# Patient Record
Sex: Female | Born: 1975 | Race: White | Hispanic: No | Marital: Married | Smoking: Never smoker
Health system: Southern US, Community
[De-identification: ages and names within clinical notes are randomized; demographics above are authoritative.]

## PROBLEM LIST (undated history)

## (undated) DIAGNOSIS — I5189 Other ill-defined heart diseases: Secondary | ICD-10-CM

## (undated) DIAGNOSIS — N8003 Adenomyosis of the uterus: Secondary | ICD-10-CM

## (undated) DIAGNOSIS — N809 Endometriosis, unspecified: Secondary | ICD-10-CM

## (undated) DIAGNOSIS — U071 COVID-19: Secondary | ICD-10-CM

## (undated) DIAGNOSIS — E119 Type 2 diabetes mellitus without complications: Secondary | ICD-10-CM

## (undated) DIAGNOSIS — I2510 Atherosclerotic heart disease of native coronary artery without angina pectoris: Secondary | ICD-10-CM

## (undated) DIAGNOSIS — K648 Other hemorrhoids: Secondary | ICD-10-CM

## (undated) DIAGNOSIS — N800 Endometriosis of uterus: Secondary | ICD-10-CM

## (undated) DIAGNOSIS — K589 Irritable bowel syndrome without diarrhea: Secondary | ICD-10-CM

## (undated) DIAGNOSIS — N200 Calculus of kidney: Secondary | ICD-10-CM

## (undated) DIAGNOSIS — H8109 Meniere's disease, unspecified ear: Secondary | ICD-10-CM

## (undated) DIAGNOSIS — O24419 Gestational diabetes mellitus in pregnancy, unspecified control: Secondary | ICD-10-CM

## (undated) DIAGNOSIS — K449 Diaphragmatic hernia without obstruction or gangrene: Secondary | ICD-10-CM

## (undated) DIAGNOSIS — E785 Hyperlipidemia, unspecified: Secondary | ICD-10-CM

## (undated) DIAGNOSIS — K8020 Calculus of gallbladder without cholecystitis without obstruction: Secondary | ICD-10-CM

## (undated) DIAGNOSIS — R42 Dizziness and giddiness: Secondary | ICD-10-CM

## (undated) HISTORY — DX: Irritable bowel syndrome, unspecified: K58.9

## (undated) HISTORY — DX: Other ill-defined heart diseases: I51.89

## (undated) HISTORY — DX: Diaphragmatic hernia without obstruction or gangrene: K44.9

## (undated) HISTORY — DX: Atherosclerotic heart disease of native coronary artery without angina pectoris: I25.10

## (undated) HISTORY — DX: Calculus of gallbladder without cholecystitis without obstruction: K80.20

## (undated) HISTORY — DX: Dizziness and giddiness: R42

## (undated) HISTORY — DX: Endometriosis, unspecified: N80.9

## (undated) HISTORY — DX: Type 2 diabetes mellitus without complications: E11.9

## (undated) HISTORY — DX: Hyperlipidemia, unspecified: E78.5

## (undated) HISTORY — PX: WISDOM TOOTH EXTRACTION: SHX21

## (undated) HISTORY — DX: Calculus of kidney: N20.0

## (undated) HISTORY — DX: Adenomyosis of the uterus: N80.03

## (undated) HISTORY — DX: Meniere's disease, unspecified ear: H81.09

## (undated) HISTORY — DX: Gestational diabetes mellitus in pregnancy, unspecified control: O24.419

## (undated) HISTORY — DX: COVID-19: U07.1

## (undated) HISTORY — DX: Other hemorrhoids: K64.8

## (undated) HISTORY — DX: Endometriosis of uterus: N80.0

---

## 1995-08-02 HISTORY — PX: CYSTECTOMY: SUR359

## 1998-05-08 ENCOUNTER — Ambulatory Visit (HOSPITAL_COMMUNITY): Admission: RE | Admit: 1998-05-08 | Discharge: 1998-05-08 | Payer: Self-pay | Admitting: Obstetrics & Gynecology

## 1999-01-14 ENCOUNTER — Inpatient Hospital Stay (HOSPITAL_COMMUNITY): Admission: AD | Admit: 1999-01-14 | Discharge: 1999-01-14 | Payer: Self-pay | Admitting: Obstetrics and Gynecology

## 1999-06-28 ENCOUNTER — Inpatient Hospital Stay (HOSPITAL_COMMUNITY): Admission: AD | Admit: 1999-06-28 | Discharge: 1999-06-28 | Payer: Self-pay | Admitting: *Deleted

## 1999-06-28 ENCOUNTER — Inpatient Hospital Stay (HOSPITAL_COMMUNITY): Admission: AD | Admit: 1999-06-28 | Discharge: 1999-06-28 | Payer: Self-pay | Admitting: Obstetrics and Gynecology

## 1999-07-23 ENCOUNTER — Encounter: Payer: Self-pay | Admitting: Obstetrics and Gynecology

## 1999-07-23 ENCOUNTER — Inpatient Hospital Stay (HOSPITAL_COMMUNITY): Admission: AD | Admit: 1999-07-23 | Discharge: 1999-07-23 | Payer: Self-pay | Admitting: Obstetrics and Gynecology

## 1999-07-26 ENCOUNTER — Inpatient Hospital Stay (HOSPITAL_COMMUNITY): Admission: AD | Admit: 1999-07-26 | Discharge: 1999-07-26 | Payer: Self-pay | Admitting: Obstetrics and Gynecology

## 1999-08-06 ENCOUNTER — Inpatient Hospital Stay (HOSPITAL_COMMUNITY): Admission: AD | Admit: 1999-08-06 | Discharge: 1999-08-06 | Payer: Self-pay | Admitting: *Deleted

## 1999-08-11 ENCOUNTER — Encounter (INDEPENDENT_AMBULATORY_CARE_PROVIDER_SITE_OTHER): Payer: Self-pay | Admitting: Specialist

## 1999-08-11 ENCOUNTER — Inpatient Hospital Stay (HOSPITAL_COMMUNITY): Admission: AD | Admit: 1999-08-11 | Discharge: 1999-08-17 | Payer: Self-pay | Admitting: Obstetrics and Gynecology

## 1999-08-14 ENCOUNTER — Encounter: Payer: Self-pay | Admitting: Obstetrics and Gynecology

## 1999-09-15 ENCOUNTER — Other Ambulatory Visit: Admission: RE | Admit: 1999-09-15 | Discharge: 1999-09-15 | Payer: Self-pay | Admitting: Obstetrics and Gynecology

## 2000-10-20 ENCOUNTER — Other Ambulatory Visit: Admission: RE | Admit: 2000-10-20 | Discharge: 2000-10-20 | Payer: Self-pay | Admitting: Obstetrics and Gynecology

## 2001-11-19 ENCOUNTER — Other Ambulatory Visit: Admission: RE | Admit: 2001-11-19 | Discharge: 2001-11-19 | Payer: Self-pay | Admitting: Obstetrics and Gynecology

## 2002-10-30 ENCOUNTER — Other Ambulatory Visit: Admission: RE | Admit: 2002-10-30 | Discharge: 2002-10-30 | Payer: Self-pay | Admitting: Obstetrics and Gynecology

## 2002-12-13 ENCOUNTER — Ambulatory Visit: Admission: RE | Admit: 2002-12-13 | Discharge: 2002-12-13 | Payer: Self-pay | Admitting: Obstetrics and Gynecology

## 2003-01-20 ENCOUNTER — Encounter: Payer: Self-pay | Admitting: Obstetrics and Gynecology

## 2003-01-20 ENCOUNTER — Inpatient Hospital Stay (HOSPITAL_COMMUNITY): Admission: AD | Admit: 2003-01-20 | Discharge: 2003-01-20 | Payer: Self-pay | Admitting: Obstetrics and Gynecology

## 2003-02-27 ENCOUNTER — Ambulatory Visit (HOSPITAL_COMMUNITY): Admission: RE | Admit: 2003-02-27 | Discharge: 2003-02-27 | Payer: Self-pay | Admitting: Obstetrics and Gynecology

## 2003-04-01 ENCOUNTER — Encounter: Admission: RE | Admit: 2003-04-01 | Discharge: 2003-04-01 | Payer: Self-pay | Admitting: Obstetrics and Gynecology

## 2003-04-21 ENCOUNTER — Inpatient Hospital Stay (HOSPITAL_COMMUNITY): Admission: AD | Admit: 2003-04-21 | Discharge: 2003-04-22 | Payer: Self-pay | Admitting: Obstetrics and Gynecology

## 2003-05-15 ENCOUNTER — Inpatient Hospital Stay (HOSPITAL_COMMUNITY): Admission: AD | Admit: 2003-05-15 | Discharge: 2003-05-19 | Payer: Self-pay | Admitting: Obstetrics and Gynecology

## 2003-06-17 ENCOUNTER — Other Ambulatory Visit: Admission: RE | Admit: 2003-06-17 | Discharge: 2003-06-17 | Payer: Self-pay | Admitting: Obstetrics and Gynecology

## 2004-11-22 ENCOUNTER — Other Ambulatory Visit: Admission: RE | Admit: 2004-11-22 | Discharge: 2004-11-22 | Payer: Self-pay | Admitting: Obstetrics and Gynecology

## 2007-01-20 ENCOUNTER — Emergency Department (HOSPITAL_COMMUNITY): Admission: EM | Admit: 2007-01-20 | Discharge: 2007-01-20 | Payer: Self-pay | Admitting: Emergency Medicine

## 2009-08-01 HISTORY — PX: CHOLECYSTECTOMY: SHX55

## 2010-01-29 HISTORY — PX: ENDOMETRIAL ABLATION: SHX621

## 2010-05-11 ENCOUNTER — Ambulatory Visit (HOSPITAL_COMMUNITY)
Admission: RE | Admit: 2010-05-11 | Discharge: 2010-05-11 | Payer: Self-pay | Source: Home / Self Care | Admitting: Obstetrics and Gynecology

## 2010-10-14 LAB — CBC
HCT: 42.2 % (ref 36.0–46.0)
Hemoglobin: 14.7 g/dL (ref 12.0–15.0)
MCH: 31.1 pg (ref 26.0–34.0)
MCHC: 34.9 g/dL (ref 30.0–36.0)
MCV: 89.2 fL (ref 78.0–100.0)
Platelets: 254 10*3/uL (ref 150–400)
RBC: 4.73 MIL/uL (ref 3.87–5.11)
RDW: 12.6 % (ref 11.5–15.5)
WBC: 7.3 10*3/uL (ref 4.0–10.5)

## 2010-10-14 LAB — PREGNANCY, URINE: Preg Test, Ur: NEGATIVE

## 2010-12-17 NOTE — Discharge Summary (Signed)
[facility]  PatientPOOJA Jones                           MRN: [id number] Adm. Date:  [id number] Disc. Date: [id number] Attending:  Frederich Balding Dictator:   Danie Chandler, R.N.                           Discharge Summary  ADMISSION DIAGNOSIS:          Intrauterine pregnancy at term with evidence of                               macrosomia.  DISCHARGE DIAGNOSES:          1. Intrauterine pregnancy at term with evidence of                                  macrosomia.                               2. Postpartum endometritis.  PROCEDURE:                    On August 11, 1999, primary low transverse cesarean                               section.  REASON FOR ADMISSION:         The patient is a 35 year old gravida 4, para 1, married white female, with an estimated date of confinement of August 21, 1999, placing her at approximately 38-1/2 to 39-weeks gestation.  The patient has been admitted to undergo a primary cesarean section for management of a macrosomic infant.  HOSPITAL COURSE:              The patient is taken to the operating room and undergoes the above-named procedure without complication.  This is productive of a viable female infant with Apgars of 8 at one minute and 9 at five minutes. Postoperatively, on day #1, the patient had good control of pain and good return of bowel function.  Her hemoglobin was 8.4, hematocrit 26.3 and white blood cell count 10.9.  On postoperative day #2, the patient was tolerating a regular diet and ambulating well without difficulty.  Her hemoglobin was stable at 8.5 and she was on iron daily.  Later that day, patients temperature rose to approximately 103.2. Her lungs were clear, abdomen nontender, but her fundus was slightly tender. Diagnosis of probable endometritis was made and she was started on Unasyn.  She had CBC with differential, blood cultures, UA and C&S ordered.  On  postoperative day #3, the patient was feeling achy, temperature had fluctuated from 103.2 to 98.2 to 98.5 and then 103.4.  Her pulse was 116, respirations 20, blood pressure 110/58. Her lung sounds did reveal bilateral crackles but cleared with deep inspiration. Her abdomen remained soft, incision was healing well but uterine tenderness remained.  Patient had chest x-ray was ordered, which was normal.  She was continued on IV fluids and Unasyn and started on incentive spirometry.  Later that day, the patients temperature rose again to 101.8 and on postoperative day #4, er  hemoglobin was 9.3, white blood cell count 9200, temperature was 102.7, patient had gentamicin added to her antibiotics, and on the next day, she continued to feel  poorly.  Blood cultures did reveal gram-negative rods and by postoperative day 5, the patient was feeling better, she did have a bowel movement and had decreased  incisional tenderness and fundal tenderness.  The patient had been afebrile for 24 hours and she was feeling quite better.  The patient was discharged home later his day.  CONDITION ON DISCHARGE:       Good.  DIET:                         Regular as tolerated.  ACTIVITY:                     No heavy lifting, no driving, no vaginal entry.  DISCHARGE INSTRUCTIONS:       She is to follow up in the office in one week and she is to call for temperature greater than 100.5 degrees Fahrenheit, any redness or drainage from the incision site, nausea or vomiting or heavy bleeding.  DISCHARGE MEDICATIONS:        1. Prenatal vitamin one p.o. q.d.                               2. Tylox, #30, one to two p.o. q.4h. p.r.n. pain.  3. Ceftin 250 mg one twice a day for seven days.                               4. Prenatal vitamin one p.o. q.d. DD:  09/02/99 TD:  09/03/99 Job: [id number]

## 2010-12-17 NOTE — Op Note (Signed)
NAMEWENDEE, Candice Jones                             ACCOUNT NO.:  [id number]   MEDICAL RECORD NO.:  [id number]                   PATIENT TYPE:  INP   LOCATION:  9102                                 FACILITY:  [facility]   PHYSICIAN:  Juluis Mire, M.D.                DATE OF BIRTH:  08/15/1975   DATE OF PROCEDURE:  05/15/2003  DATE OF DISCHARGE:                                 OPERATIVE REPORT   PREOPERATIVE DIAGNOSES:  1. Intrauterine pregnancy at 39 weeks.  2. Prior cesarean section.  3. Desires repeat.   POSTOPERATIVE DIAGNOSES:  1. Intrauterine pregnancy at 39 weeks.  2. Prior cesarean section.  3. Desires repeat.   PROCEDURE:  Low-transverse cesarean section.   SURGEON:  Juluis Mire, M.D.   ANESTHESIA:  Spinal.   ESTIMATED BLOOD LOSS:  800 mL.   PACKS AND DRAINS:  None.   BLOOD REPLACED:  None.   COMPLICATIONS:  None.   INDICATIONS:  Dictated in the history and physical.   DESCRIPTION OF PROCEDURE:  The patient was taken to the OR and placed in the  supine position with left lateral tilt.  After a satisfactory level of  spinal anesthesia was obtained, the abdomen was prepped out with Betadine  and draped as a sterile field.   A prior low-transverse skin incision was made with a knife and carried  through the subcutaneous tissue.  The anterior rectus fascia was entered  sharply and the incision in the fascia extended laterally.  The fascia was  taken off the muscle superiorly and inferiorly.  Rectus muscles were  separated in the midline, and the peritoneum was entered sharply and the  incision in the peritoneum was extended both superiorly and inferiorly.  A  low-transverse bladder flap was developed, a low-transverse uterine incision  was begun with a knife and extended laterally using manual traction.  Amniotic fluid was clear.   The infant presented in the vertex presentation.  It was delivered with  elevation of the head and fundal pressure.  The infant was a  viable female who  weighed 10 pounds; APGARS 9/9.  The umbilical artery pH was 7.34.  The  placenta was then delivered manually.  The uterus was wiped free of the  remaining membranes and placenta.  The uterus was exteriorized for closure.  Tubes and ovaries were unremarkable.  The uterus was closed in a running  interlocking suture of #0 chromic using a two layer closure technique.  We  had good hemostasis.  The uterus was returned to the abdominal cavity.  Urine output was clear and adequate.   The muscles were reapproximated with a running suture of 3-0 Vicryl, fascia  closed with a running suture of #0 PDS, skin was closed with staples and  Steri-Strips.  Sponge, instrument, and needle count was correct by the  circulating nurse x2.  Urine output remained clear  at time of closure.   The patient tolerated the procedure well and was returned to the recovery  room in good condition.                                               Juluis Mire, M.D.    JSM/MEDQ  D:  05/15/2003  T:  05/15/2003  Job:  [id number]

## 2010-12-17 NOTE — H&P (Signed)
Candice Jones, Candice Jones                             ACCOUNT NO.:  [id number]   MEDICAL RECORD NO.:  [id number]                   PATIENT TYPE:  INP   LOCATION:  9102                                 FACILITY:  [facility]   PHYSICIAN:  Juluis Mire, M.D.                DATE OF BIRTH:  [birth year]   DATE OF ADMISSION:  05/15/2003  DATE OF DISCHARGE:                                HISTORY & PHYSICAL   HISTORY OF PRESENT ILLNESS:  The patient is a 35 year old gravida 5, para 2-  0-2-2 married white female with last menstrual period of August 17, 2002,  for an estimated date of confinement of May 26, 2003.  This gives her an  estimated gestational age of approximately 38-1/2 weeks.  This is consistent  with initial examination and ultrasound.  The patient presents for repeat  cesarean section.   The patient had a prior low transverse cesarean section with the last  pregnancy due to macrosomia.  The infant did weigh 10 pounds and 14 ounces.  Her prior delivery before that was a vaginal delivery.  She has opted to  proceed with a repeat cesarean section as opposed to a trial of labor. She  does have a history of prior pregnancy losses and a history of preterm labor  with prior pregnancies.  This pregnancy has been relatively uncomplicated  without any difficulties.  She presents now for repeat cesarean section.   ALLERGIES:  1. ERYTHROMYCIN.  2. SULFA.  3. AUGMENTIN.   MEDICATION:  Prenatal vitamins.   For past medical history, family history, and social history, please see  prenatal records.   REVIEW OF SYMPTOMS:  Noncontributory.   PHYSICAL EXAMINATION:  VITAL SIGNS:  The patient is afebrile with stable  vital signs.  HEENT:  Normocephalic.  Pupils are equal, round, reactive to light and  accommodation.  Extraocular movements were intact.  Sclerae and conjunctivae  clear.  Oropharynx clear.  NECK:  No thyromegaly.  BREASTS:  No discrete masses but glandular.  LUNGS:  Clear.  CARDIOVASCULAR:  Regular rate and rhythm with grade 2/6 systolic ejection  murmur.  No clicks or gallops.  ABDOMEN:  Gravid uterus consistent with dates.  PELVIC:  Examination deferred.  EXTREMITIES:  Trace edema.  NEUROLOGIC:  Deep tendon reflexes 2+, no clonus.   IMPRESSION:  Intrauterine pregnancy at term with prior cesarean section.  Desires repeat.    PLAN:  The patient will undergo repeat cesarean section.  Risks were  discussed including the risk of infection, the risk of hemorrhage that can  necessitate transfusion.  Risk of injury to adjacent organs including  bladder, bowel, ureters, that could require further exploratory surgery.  Risk of deep venous thrombosis and pulmonary embolus.  The patient expressed  understanding of indications and risks.  Juluis Mire, M.D.    JSM/MEDQ  D:  05/15/2003  T:  05/15/2003  Job:  [id number]

## 2010-12-17 NOTE — Discharge Summary (Signed)
NAMESHARLOTTE, Candice Jones                             ACCOUNT NO.:  [id number]   MEDICAL RECORD NO.:  [id number]                   PATIENT TYPE:  INP   LOCATION:  9102                                 FACILITY:  [facility]   PHYSICIAN:  Dineen Kid. Rana Snare, M.D.                 DATE OF BIRTH:  [birth year]   DATE OF ADMISSION:  05/15/2003  DATE OF DISCHARGE:  05/19/2003                                 DISCHARGE SUMMARY   ADMITTING DIAGNOSES:  1. Intrauterine pregnancy at term.  2. Previous cesarean delivery, desires repeat.  3. Macrosomia.   DISCHARGE DIAGNOSES:  1. Status post low transverse cesarean section.  2. Viable female infant.   PROCEDURE:  Repeat low transverse cesarean section.   REASON FOR ADMISSION:  Please see dictated H&P.   HOSPITAL COURSE:  The patient was a 35 year old gravida 5 para 2 white  married female that was admitted to [facility] for a  scheduled cesarean delivery.  The patient had had a previous cesarean  section due to macrosomia.  The patient had opted to proceed with a repeat  cesarean as opposed to a trial of labor.  On the morning of admission the  patient was taken to the operating room where spinal anesthesia was  administered without difficulty.  A low transverse incision was made with  the delivery of a viable female infant weighing 10 pounds with Apgars of 9 at  one minute and 9 at five minutes.  Umbilical cord pH was 7.34.  The patient  tolerated the procedure well and was taken to the recovery room in stable.  On postoperative day #1 vital signs were stable.  Abdomen was soft with good  return of bowel function.  Abdominal dressing was noted to be clean, dry,  and intact.  Labs revealed hemoglobin of 11.7; platelet count of 203,000;  wbc count of 11.1.  On postoperative day #2 vital signs were stable; the  patient remained afebrile.  Abdomen was soft.  Fundus was firm and  nontender.  Abdominal dressing was removed revealing an incision that  was  clean, dry, and intact.  On postoperative day #3 vital signs were stable;  the patient remained afebrile.  Abdomen was soft.  Fundus was firm and  nontender.  Incision was clean, dry, and intact.  She was tolerating a  regular diet without complaints of nausea and vomiting.  She was ambulating  well.  On postoperative day #4 the patient was doing well.  Vital signs were  stable; she remained afebrile.  Abdomen was soft.  Fundus was firm and  nontender.  Incision was clean, dry, and intact.  Staples were removed and  the patient was discharged home.   CONDITION ON DISCHARGE:  Good.   DIET:  Regular as tolerated.   ACTIVITY:  No heavy lifting, no driving x2 weeks, no vaginal entry.   FOLLOW-UP:  The patient is to follow up in the office in one week for an  incision check.  She is to call for temperature greater than 100 degrees,  persistent nausea and vomiting, heavy vaginal bleeding, and/or redness or  drainage from the incisional site.    DISCHARGE MEDICATIONS:  1. Percocet 5/325 #30 one p.o. q.4-6h. p.r.n. pain.  2. Motrin 600 mg q.6h. p.r.n.  3. Prenatal vitamins one p.o. daily.  4. Colace one p.o. daily p.r.n.     Julio Sicks, N.P.                        Dineen Kid Rana Snare, M.D.    CC/MEDQ  D:  06/06/2003  T:  06/06/2003  Job:  [id number]

## 2010-12-17 NOTE — Op Note (Signed)
[facility]  PatientANTONIETTA Jones                           MRN: [id number] Proc. Date: 08/11/99 Adm. Date:  [id number] Attending:  Frederich Balding                           Operative Report  PREOPERATIVE DIAGNOSIS:       Uterine pregnancy at term with evidence of                               macrosomia.  POSTOPERATIVE DIAGNOSIS:      Uterine pregnancy at term with evidence of                               macrosomia.  OPERATION:                    Low transverse cesarean section.  SURGEON:                      Juluis Mire, M.D.  ANESTHESIA:                   Spinal.  ESTIMATED BLOOD LOSS:         800 cc  PACKS/DRAINS:                 None.  INTRAOPERATIVE BLOOD REPLACEMENT:                  None.  COMPLICATIONS:                None.  INDICATION FOR PROCEDURE:     Dictated in the history and physical.  DESCRIPTION OF PROCEDURE:     The patient was taken to the OR and placed in the  supine position with left lateral tilt.  After satisfactory level of spinal anesthesia was obtained, the abdomen was prepped with Betadine and draped in a sterile field.  Low transverse skin incision was made with the knife and carried through the subcutaneous tissue.  The anterior rectus fascia was entered sharply. Incision fashioned laterally.  The fascia was taken off the muscle superiorly and inferiorly using blunt and sharp dissection. The rectus muscles were separated n the midline.  The perineum was entered sharply.  The incision in the perineum was extended both superiorly and inferiorly.  There was no evidence of any injury to attached organs.  A bladder flap was developed.  A low transverse uterine incision was begun with a knife and extended laterally using manual traction. The infant  presented in the vertex presentation and was delivered with elevation and fundal pressure.  Copious amniotic fluid was noted.  The infant was a  viable female who weighted 10 pounds 14 ounces. Apgar was 9.  Umbilical cord pH was 7.28. Placenta was delivered manually and sent for pathological review.  The uterus was closed  with interlocking sutures of 0 chromic using two layer closure technique with good hemostasis.  The tubes and ovaries were visualized and noted to be unremarkable. Urine output remained clear and adequate.  Muscles reapproximated with a running suture of 2-0 Vicryl.  The fascia was closed with running suture PDS.  The skin was closed with  staples and Steri-Strips.  Sponge, instrument and needle count were  verified as correct by the circulating nurse times two.  Foley catheter remained clear at the time of closure.  The patient tolerated the procedure well and was  returned to the recovery room in good condition. DD:  08/12/99 TD:  08/12/99 Job: [id number]

## 2011-05-09 ENCOUNTER — Inpatient Hospital Stay (INDEPENDENT_AMBULATORY_CARE_PROVIDER_SITE_OTHER)
Admission: RE | Admit: 2011-05-09 | Discharge: 2011-05-09 | Disposition: A | Discharge status: Home / Self Care | Payer: BC Managed Care – PPO | Source: Ambulatory Visit | Attending: Emergency Medicine | Admitting: Emergency Medicine

## 2011-05-09 ENCOUNTER — Emergency Department (HOSPITAL_COMMUNITY)
Admission: EM | Admit: 2011-05-09 | Discharge: 2011-05-10 | Disposition: A | Discharge status: Home / Self Care | Payer: BC Managed Care – PPO | Attending: Emergency Medicine | Admitting: Emergency Medicine

## 2011-05-09 ENCOUNTER — Emergency Department (HOSPITAL_COMMUNITY): Payer: BC Managed Care – PPO

## 2011-05-09 DIAGNOSIS — M549 Dorsalgia, unspecified: Secondary | ICD-10-CM | POA: Insufficient documentation

## 2011-05-09 DIAGNOSIS — K8020 Calculus of gallbladder without cholecystitis without obstruction: Secondary | ICD-10-CM | POA: Insufficient documentation

## 2011-05-09 DIAGNOSIS — K819 Cholecystitis, unspecified: Secondary | ICD-10-CM

## 2011-05-09 DIAGNOSIS — R1011 Right upper quadrant pain: Secondary | ICD-10-CM | POA: Insufficient documentation

## 2011-05-09 DIAGNOSIS — R10811 Right upper quadrant abdominal tenderness: Secondary | ICD-10-CM | POA: Insufficient documentation

## 2011-05-09 LAB — BASIC METABOLIC PANEL
BUN: 9 mg/dL (ref 6–23)
Chloride: 104 mEq/L (ref 96–112)
Creatinine, Ser: 0.48 mg/dL — ABNORMAL LOW (ref 0.50–1.10)
Glucose, Bld: 96 mg/dL (ref 70–99)
Potassium: 3.6 mEq/L (ref 3.5–5.1)

## 2011-05-09 LAB — HEPATIC FUNCTION PANEL
Albumin: 4.4 g/dL (ref 3.5–5.2)
Total Bilirubin: 0.4 mg/dL (ref 0.3–1.2)
Total Protein: 7.6 g/dL (ref 6.0–8.3)

## 2011-05-09 LAB — URINALYSIS, ROUTINE W REFLEX MICROSCOPIC
Bilirubin Urine: NEGATIVE
Glucose, UA: NEGATIVE mg/dL
Hgb urine dipstick: NEGATIVE
Specific Gravity, Urine: 1.017 (ref 1.005–1.030)
pH: 7 (ref 5.0–8.0)

## 2011-05-09 LAB — LIPASE, BLOOD: Lipase: 31 U/L (ref 11–59)

## 2011-05-09 LAB — CBC
HCT: 41.7 % (ref 36.0–46.0)
Hemoglobin: 15 g/dL (ref 12.0–15.0)
MCV: 84.8 fL (ref 78.0–100.0)
WBC: 11 10*3/uL — ABNORMAL HIGH (ref 4.0–10.5)

## 2011-05-09 LAB — DIFFERENTIAL
Basophils Absolute: 0 10*3/uL (ref 0.0–0.1)
Lymphocytes Relative: 26 % (ref 12–46)
Lymphs Abs: 2.9 10*3/uL (ref 0.7–4.0)
Monocytes Absolute: 0.7 10*3/uL (ref 0.1–1.0)
Neutro Abs: 7.2 10*3/uL (ref 1.7–7.7)

## 2011-05-09 LAB — URINE MICROSCOPIC-ADD ON

## 2011-05-09 LAB — POCT PREGNANCY, URINE: Preg Test, Ur: NEGATIVE

## 2011-05-12 ENCOUNTER — Encounter (INDEPENDENT_AMBULATORY_CARE_PROVIDER_SITE_OTHER): Payer: Self-pay | Admitting: Surgery

## 2011-05-12 ENCOUNTER — Ambulatory Visit (INDEPENDENT_AMBULATORY_CARE_PROVIDER_SITE_OTHER): Payer: BC Managed Care – PPO | Admitting: Surgery

## 2011-05-12 DIAGNOSIS — K8020 Calculus of gallbladder without cholecystitis without obstruction: Secondary | ICD-10-CM | POA: Insufficient documentation

## 2011-05-12 NOTE — Progress Notes (Signed)
Chief Complaint  Patient presents with  . Abdominal Pain    referral from Dr. Brock Bad, ER    HISTORY: Patient is a 35 year old female referred from the emergency department with recent episode of biliary colic. Patient had experienced heartburn for 2 days. This seemed to be related to food intake. Patient developed right upper quadrant abdominal pain radiating to the back. She was seen and evaluated on May 09, 2011 in the emergency department. Laboratory studies showed normal liver function test. Patient underwent abdominal ultrasound showing multiple gallstones. There was no paracholecystic fluid. There was no biliary dilatation. Patient is referred to surgery for consideration for cholecystectomy.  Patient had a similar episode approximately one year ago. She is experienced a low-grade fever with these episodes. She has taken Zantac without symptomatic relief. Of note the patient's mother underwent cholecystectomy at approximately age 46.   Past Medical History  Diagnosis Date  . Asthma   . Diabetes mellitus   . Hyperlipidemia   . Chills   . Abdominal pain   . Constipation   . Nausea   . Constipation      Current Outpatient Prescriptions  Medication Sig Dispense Refill  . cholecalciferol (VITAMIN D) 1000 UNITS tablet Take 1,000 Units by mouth daily.        Marland Kitchen HYDROcodone-acetaminophen (NORCO) 5-325 MG per tablet       . Multiple Vitamin (MULTIVITAMIN PO) Take by mouth daily.        . ondansetron (ZOFRAN-ODT) 8 MG disintegrating tablet          Allergies  Allergen Reactions  . Augmentin     Vomiting   . Erythromycin     Rash   . Sulfur     rash     History reviewed. No pertinent family history.   History   Social History  . Marital Status: Married    Spouse Name: N/A    Number of Children: N/A  . Years of Education: N/A   Social History Main Topics  . Smoking status: Never Smoker   . Smokeless tobacco: Never Used  . Alcohol Use: No  . Drug Use: No    . Sexually Active:    Other Topics Concern  . None   Social History Narrative  . None     REVIEW OF SYSTEMS - PERTINENT POSITIVES ONLY: Right upper quadrant abdominal pain. Low-grade fever. No history of jaundice. No history of acholic stools.   EXAM: Filed Vitals:   05/12/11 1425  BP: 120/78  Pulse: 64  Temp: 97.9 F (36.6 C)  Resp: 16    HEENT: normocephalic; pupils equal and reactive; sclerae clear; dentition good; mucous membranes moist NECK:  No nodules; symmetric on extension; no palpable anterior or posterior cervical lymphadenopathy; no supraclavicular masses; no tenderness CHEST: clear to auscultation bilaterally without rales, rhonchi, or wheezes CARDIAC: regular rate and rhythm without significant murmur; peripheral pulses are full ABDOMEN: Abdomen is soft without distention. No surgical wounds in the upper abdomen. Umbilicus is normal. No sign of hernia. No hepatosplenomegaly. No tenderness. No masses. No Murphy sign. EXT:  non-tender without edema; no deformity NEURO: no gross focal deficits; no sign of tremor   LABORATORY RESULTS: See E-Chart for most recent results   RADIOLOGY RESULTS: See E-Chart or I-Site for most recent results   IMPRESSION Symptomatic cholelithiasis, probable chronic cholecystitis   PLAN: The patient and I had a lengthy discussion regarding her symptoms, her study results, and recommendations for management. I have recommended laparoscopic cholecystectomy  with intraoperative cholangiography. We discussed the procedure. We've discussed the possibility of conversion to open surgery. We discussed the possibility of common bile duct stones. She understands and wishes to proceed. We will make arrangements for surgery in the near future at a time convenient for the patient.  The risks and benefits of the procedure have been discussed at length with the patient.  The patient understands the proposed procedure, potential alternative  treatments, and the course of recovery to be expected.  All of the patient's questions have been answered at this time.  The patient wishes to proceed with surgery and will schedule a date for their procedure through our office staff.  Velora Heckler, MD, FACS General & Endocrine Surgery [facility]      Visit Diagnoses: 1. Gallstones     Primary Care Physician: Leo Grosser, MD, MD  GYN:  Richardean Chimera, MD

## 2011-06-09 ENCOUNTER — Other Ambulatory Visit (INDEPENDENT_AMBULATORY_CARE_PROVIDER_SITE_OTHER): Payer: Self-pay | Admitting: Surgery

## 2011-06-09 DIAGNOSIS — K824 Cholesterolosis of gallbladder: Secondary | ICD-10-CM

## 2011-06-09 DIAGNOSIS — K811 Chronic cholecystitis: Secondary | ICD-10-CM

## 2011-06-12 ENCOUNTER — Encounter (HOSPITAL_COMMUNITY): Payer: Self-pay

## 2011-06-12 ENCOUNTER — Telehealth (INDEPENDENT_AMBULATORY_CARE_PROVIDER_SITE_OTHER): Payer: Self-pay | Admitting: General Surgery

## 2011-06-12 ENCOUNTER — Emergency Department (HOSPITAL_COMMUNITY)
Admission: EM | Admit: 2011-06-12 | Discharge: 2011-06-12 | Disposition: A | Discharge status: Home / Self Care | Payer: BC Managed Care – PPO | Attending: Emergency Medicine | Admitting: Emergency Medicine

## 2011-06-12 ENCOUNTER — Emergency Department (HOSPITAL_COMMUNITY): Payer: BC Managed Care – PPO

## 2011-06-12 DIAGNOSIS — R110 Nausea: Secondary | ICD-10-CM | POA: Insufficient documentation

## 2011-06-12 DIAGNOSIS — R5383 Other fatigue: Secondary | ICD-10-CM | POA: Insufficient documentation

## 2011-06-12 DIAGNOSIS — E119 Type 2 diabetes mellitus without complications: Secondary | ICD-10-CM | POA: Insufficient documentation

## 2011-06-12 DIAGNOSIS — Z79899 Other long term (current) drug therapy: Secondary | ICD-10-CM | POA: Insufficient documentation

## 2011-06-12 DIAGNOSIS — Z9889 Other specified postprocedural states: Secondary | ICD-10-CM | POA: Insufficient documentation

## 2011-06-12 DIAGNOSIS — E785 Hyperlipidemia, unspecified: Secondary | ICD-10-CM | POA: Insufficient documentation

## 2011-06-12 DIAGNOSIS — R109 Unspecified abdominal pain: Secondary | ICD-10-CM | POA: Insufficient documentation

## 2011-06-12 DIAGNOSIS — R509 Fever, unspecified: Secondary | ICD-10-CM | POA: Insufficient documentation

## 2011-06-12 DIAGNOSIS — J45909 Unspecified asthma, uncomplicated: Secondary | ICD-10-CM | POA: Insufficient documentation

## 2011-06-12 DIAGNOSIS — R5381 Other malaise: Secondary | ICD-10-CM | POA: Insufficient documentation

## 2011-06-12 DIAGNOSIS — Z9049 Acquired absence of other specified parts of digestive tract: Secondary | ICD-10-CM

## 2011-06-12 DIAGNOSIS — R10819 Abdominal tenderness, unspecified site: Secondary | ICD-10-CM | POA: Insufficient documentation

## 2011-06-12 LAB — BASIC METABOLIC PANEL
CO2: 25 mEq/L (ref 19–32)
Chloride: 102 mEq/L (ref 96–112)
Creatinine, Ser: 0.61 mg/dL (ref 0.50–1.10)
Sodium: 137 mEq/L (ref 135–145)

## 2011-06-12 LAB — HEPATIC FUNCTION PANEL
ALT: 368 U/L — ABNORMAL HIGH (ref 0–35)
Albumin: 4 g/dL (ref 3.5–5.2)
Alkaline Phosphatase: 127 U/L — ABNORMAL HIGH (ref 39–117)
Total Protein: 7.2 g/dL (ref 6.0–8.3)

## 2011-06-12 LAB — DIFFERENTIAL
Basophils Absolute: 0 10*3/uL (ref 0.0–0.1)
Lymphocytes Relative: 20 % (ref 12–46)
Monocytes Absolute: 0.6 10*3/uL (ref 0.1–1.0)
Neutro Abs: 7.3 10*3/uL (ref 1.7–7.7)

## 2011-06-12 LAB — CBC
HCT: 43.1 % (ref 36.0–46.0)
Hemoglobin: 14.3 g/dL (ref 12.0–15.0)
RBC: 4.99 MIL/uL (ref 3.87–5.11)
RDW: 12.5 % (ref 11.5–15.5)
WBC: 10 10*3/uL (ref 4.0–10.5)

## 2011-06-12 LAB — URINALYSIS, ROUTINE W REFLEX MICROSCOPIC
Glucose, UA: NEGATIVE mg/dL
Hgb urine dipstick: NEGATIVE
Specific Gravity, Urine: 1.031 — ABNORMAL HIGH (ref 1.005–1.030)
pH: 6 (ref 5.0–8.0)

## 2011-06-12 MED ORDER — HYDROMORPHONE HCL PF 1 MG/ML IJ SOLN
INTRAMUSCULAR | Status: AC
  Administered 2011-06-12: 21:00:00
  Filled 2011-06-12: qty 1

## 2011-06-12 MED ORDER — ONDANSETRON HCL 4 MG/2ML IJ SOLN
4.0000 mg | Freq: Once | INTRAMUSCULAR | Status: AC
  Administered 2011-06-12: 4 mg via INTRAVENOUS
  Filled 2011-06-12: qty 2

## 2011-06-12 MED ORDER — ONDANSETRON HCL 4 MG/2ML IJ SOLN: 4.0000 mg | Freq: Once | INTRAMUSCULAR | Status: DC

## 2011-06-12 MED ORDER — HYDROMORPHONE HCL PF 1 MG/ML IJ SOLN: 1.0000 mg | Freq: Once | INTRAMUSCULAR | Status: DC

## 2011-06-12 MED ORDER — IOHEXOL 300 MG/ML  SOLN
100.0000 mL | Freq: Once | INTRAMUSCULAR | Status: AC | PRN
  Administered 2011-06-12: 100 mL via INTRAVENOUS

## 2011-06-12 MED ORDER — HYDROMORPHONE HCL PF 1 MG/ML IJ SOLN
1.0000 mg | Freq: Once | INTRAMUSCULAR | Status: AC
  Administered 2011-06-12: 1 mg via INTRAVENOUS
  Filled 2011-06-12: qty 1

## 2011-06-12 MED ORDER — ONDANSETRON HCL 4 MG/2ML IJ SOLN
INTRAMUSCULAR | Status: AC
  Administered 2011-06-12: 21:00:00
  Filled 2011-06-12: qty 2

## 2011-06-12 MED ORDER — OXYCODONE-ACETAMINOPHEN 5-325 MG PO TABS: 2.0000 | ORAL_TABLET | ORAL | Status: AC | PRN

## 2011-06-12 NOTE — ED Provider Notes (Signed)
History     CSN: [id number] Arrival date & time: 06/12/2011  3:15 PM   First MD Initiated Contact with Patient 06/12/11 1734      Chief Complaint  Patient presents with  . Abdominal Pain    Pt with chole on thursday, yesterday and today increased pain, weak low grade fever, nauseated, low grade fever, spoke with on call [facility], told to come in for check    (Consider location/radiation/quality/duration/timing/severity/associated sxs/prior treatment) HPI Comments: Had lap chole by Dr. Sherryl Barters three days ago.  Initially was doing better.  Now having more pain and fever.    Patient is a 35 y.o. female presenting with abdominal pain. The history is provided by the patient.  Abdominal Pain The primary symptoms of the illness include abdominal pain, fever and fatigue. The primary symptoms of the illness do not include shortness of breath, nausea, vomiting or dysuria. The current episode started 2 days ago. The onset of the illness was gradual. The problem has been gradually worsening.  The patient states that she believes she is currently not pregnant. The patient has not had a change in bowel habit. Symptoms associated with the illness do not include chills or constipation.    Past Medical History  Diagnosis Date  . Asthma   . Diabetes mellitus   . Hyperlipidemia   . Chills   . Abdominal pain   . Constipation   . Nausea   . Constipation     Past Surgical History  Procedure Date  . Endometrial ablation 01/2010  . Cesarean section 08/1999, 05/2003  . Cystectomy 1997    left wrist   . Wisdom tooth extraction 1996/1997  . Cholecystectomy     History reviewed. No pertinent family history.  History  Substance Use Topics  . Smoking status: Never Smoker   . Smokeless tobacco: Never Used  . Alcohol Use: No    OB History    Grav Para Term Preterm Abortions TAB SAB Ect Mult Living                  Review of Systems  Constitutional: Positive for fever and fatigue. Negative for  chills.  Respiratory: Negative for shortness of breath.   Gastrointestinal: Positive for abdominal pain. Negative for nausea, vomiting and constipation.  Genitourinary: Negative for dysuria.  All other systems reviewed and are negative.    Allergies  Augmentin; Ciprofloxacin hcl; Erythromycin; and Sulfur  Home Medications   Current Outpatient Rx  Name Route Sig Dispense Refill  . VITAMIN D 1000 UNITS PO TABS Oral Take 1,000 Units by mouth daily.      Marland Kitchen HYDROCODONE-ACETAMINOPHEN 5-500 MG PO TABS Oral Take 1 tablet by mouth every 6 (six) hours as needed. For pain     . MULTIVITAMIN PO Oral Take by mouth daily.      Marland Kitchen ONDANSETRON 8 MG PO TBDP        BP 113/56  Pulse 68  Temp(Src) 98.4 F (36.9 C) (Oral)  Resp 19  SpO2 8%  LMP 05/24/2011  Physical Exam  Nursing note and vitals reviewed. Constitutional: She is oriented to person, place, and time. She appears well-developed and well-nourished.  HENT:  Head: Normocephalic and atraumatic.  Neck: Normal range of motion. Neck supple.  Cardiovascular: Normal rate and regular rhythm.  Exam reveals no gallop and no friction rub.   No murmur heard. Pulmonary/Chest: Effort normal and breath sounds normal. No respiratory distress.  Abdominal: Soft. There is tenderness.  Tenderness in all 4 quadrants, but no rebound or guarding.  Musculoskeletal: Normal range of motion.  Neurological: She is alert and oriented to person, place, and time.  Skin: Skin is warm and dry. She is not diaphoretic.    ED Course  Procedures (including critical care time)   Labs Reviewed  CBC  DIFFERENTIAL  BASIC METABOLIC PANEL  HEPATIC FUNCTION PANEL  LIPASE, BLOOD  URINALYSIS, ROUTINE W REFLEX MICROSCOPIC  PREGNANCY, URINE   No results found.   No diagnosis found.    MDM  Patient had unremarkable Lap Chole 3 days ago.  Starting to have some pain and fever again.  No n/v/d.  No chills.  Bowels okay.  CT scan shows no acute process.  There  is some free fluid suggestive of a ruptured ovarian cyst.  Patient has no wbc ct, no fever here.  Will speak with surgeon on call.  Spoke with Dr. Janee Morn who agreed that no emergent intervention necessary.  Will discharge with prescription for percocet.        Geoffery Lyons, MD 06/12/11 2215

## 2011-06-12 NOTE — Telephone Encounter (Signed)
S/p lap chole with IOC at [facility] on Thurs.  She called c/o increasing pain and temps 99.2.  I recommended that she come to [facility] ER for evaluation with labs, exam, and CT.

## 2011-06-12 NOTE — ED Notes (Signed)
Call to CT , Pt completed PO contrast

## 2011-06-12 NOTE — ED Notes (Signed)
IV Team called to start IV site

## 2011-06-16 ENCOUNTER — Other Ambulatory Visit: Payer: Self-pay | Admitting: Dermatology

## 2011-06-29 ENCOUNTER — Ambulatory Visit (INDEPENDENT_AMBULATORY_CARE_PROVIDER_SITE_OTHER): Payer: BC Managed Care – PPO | Admitting: Surgery

## 2011-06-29 ENCOUNTER — Encounter (INDEPENDENT_AMBULATORY_CARE_PROVIDER_SITE_OTHER): Payer: Self-pay | Admitting: Surgery

## 2011-06-29 VITALS — BP 106/74 | HR 66 | Temp 97.4°F | Resp 16 | Ht 64.25 in | Wt 165.6 lb

## 2011-06-29 DIAGNOSIS — K8020 Calculus of gallbladder without cholecystitis without obstruction: Secondary | ICD-10-CM

## 2011-06-29 NOTE — Progress Notes (Signed)
Visit Diagnoses: 1. Gallstones     HISTORY: Patient returns for her first postoperative visit having undergone laparoscopic cholecystectomy with intraoperative cholangiography 3 weeks ago. Postoperative course is notable for pain which took her to the emergency department on the third postoperative day. Assessment included laboratory studies and a CT scan of the abdomen. All studies were normal. Pain has since resolved. Bowels have returned to normal.  EXAM: Abdomen is soft nontender without distention. Surgical wounds are well healed. Right upper quadrant shows no palpable mass or tenderness.  IMPRESSION: Status post laparoscopic cholecystectomy for chronic cholecystitis and cholelithiasis  PLAN: The patient will apply topical creams to her incisions. She will avoid heavy lifting for the next 2 weeks. She will return to see me in this office as needed.   Velora Heckler, MD, FACS General & Endocrine Surgery [facility]

## 2011-06-29 NOTE — Patient Instructions (Signed)
  COCOA BUTTER & VITAMIN E CREAM  ([employer] or other brand)  Apply cocoa butter/vitamin E cream to your incision 2 - 3 times daily.  Massage cream into incision for one minute with each application.  Use sunscreen (50 SPF or higher) for first 6 months after surgery.  You may substitute Mederma or other scar reducing creams as desired.   

## 2011-09-18 ENCOUNTER — Ambulatory Visit (INDEPENDENT_AMBULATORY_CARE_PROVIDER_SITE_OTHER): Payer: BC Managed Care – PPO | Admitting: Family Medicine

## 2011-09-18 VITALS — BP 116/76 | HR 74 | Temp 98.5°F | Resp 16 | Ht 64.0 in | Wt 164.0 lb

## 2011-09-18 DIAGNOSIS — B029 Zoster without complications: Secondary | ICD-10-CM

## 2011-09-18 DIAGNOSIS — J069 Acute upper respiratory infection, unspecified: Secondary | ICD-10-CM

## 2011-09-18 MED ORDER — VALACYCLOVIR HCL 1 G PO TABS: 1000.0000 mg | ORAL_TABLET | Freq: Three times a day (TID) | ORAL | Status: DC

## 2011-09-18 MED ORDER — GABAPENTIN 300 MG PO CAPS: 300.0000 mg | ORAL_CAPSULE | Freq: Every day | ORAL | Status: DC

## 2011-09-18 MED ORDER — PREDNISONE 20 MG PO TABS: ORAL_TABLET | ORAL | Status: DC

## 2011-09-18 NOTE — Progress Notes (Signed)
  Subjective:    Patient ID: Candice Jones, female    DOB: 1975/11/21, 36 y.o.   MRN: [id number]  HPI Saturday morning patient developed itchy/burning rash behind her (R) leg.  Recent URI  Review of Systems  Constitutional: Negative for fever and chills.  HENT: Positive for rhinorrhea and postnasal drip.   Respiratory: Negative for cough.        Objective:   Physical Exam  Neck: Normal range of motion. Neck supple.  Cardiovascular: Normal rate, regular rhythm and normal heart sounds.   Pulmonary/Chest: Effort normal and breath sounds normal.  Neurological: She is alert. No sensory deficit.  Skin: Skin is warm. Papular rash: lesions/vesicular lesions (R) posterior thigh.          Assessment & Plan:   1. Shingles   2. URI (upper respiratory infection)     Initially prescribed Valtrex however patient called back that the Rx was too expensive. Acyclovir 800 mg 5x's days was called in to substitute for the Valtrex. The remainder of the medications per the AVS. Patient to call with follow up in 2 days.

## 2011-09-20 ENCOUNTER — Telehealth: Payer: Self-pay | Admitting: Family Medicine

## 2011-09-20 NOTE — Telephone Encounter (Signed)
Spoke with patient and overall she is doing well. Ok to increase Neurontin to BID. Call with follow up in 72 hours.

## 2011-09-29 ENCOUNTER — Telehealth: Payer: Self-pay

## 2012-01-16 IMAGING — CT CT ABD-PELV W/ CM
2 of 4 series · 13 of 32 positions shown, 18 images · IV contrast (omnipaque)
Comparison: Abdominal ultrasound performed 05/10/2011

CLINICAL DATA: Left-sided mid abdominal pain and lower abdominal
pain.  Recent laparoscopic cholecystectomy.

CT ABDOMEN AND PELVIS WITH CONTRAST
TECHNIQUE: Multidetector CT imaging of the abdomen and pelvis was
performed following the standard protocol during bolus
administration of intravenous contrast.
Contrast: 100mL OMNIPAQUE IOHEXOL 300 MG/ML IV SOLN

[Series 2: routine abdomen · axial · 0.70mm/px · z∈[-359,-59]mm · 7 of 82 slices shown, 12 images]
[im 11/82  soft-tissue]
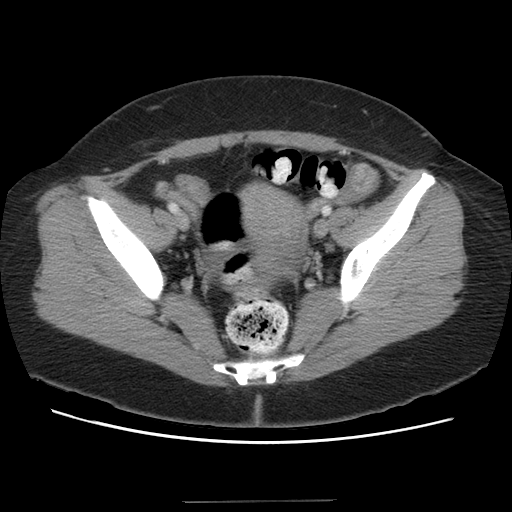
[im 11/82  bone]
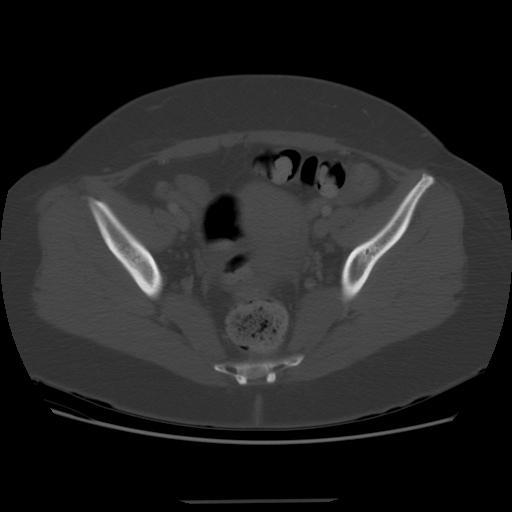
[im 21/82  soft-tissue]
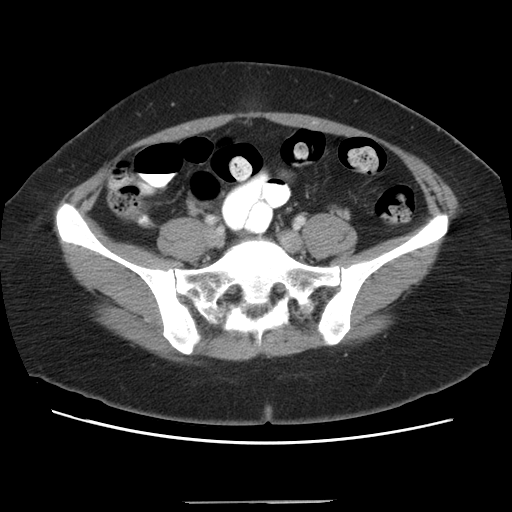
[im 31/82  soft-tissue]
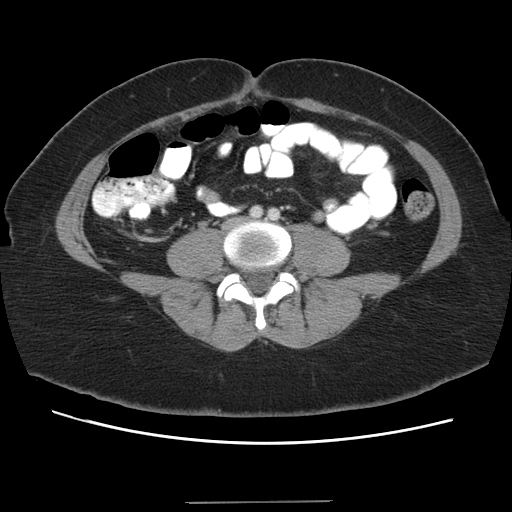
[im 41/82  soft-tissue]
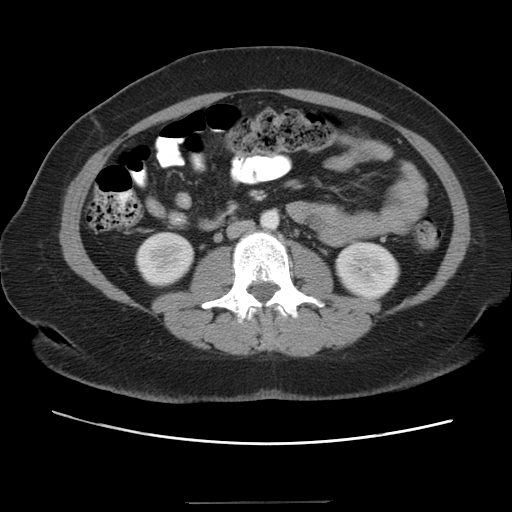
[im 41/82  lung]
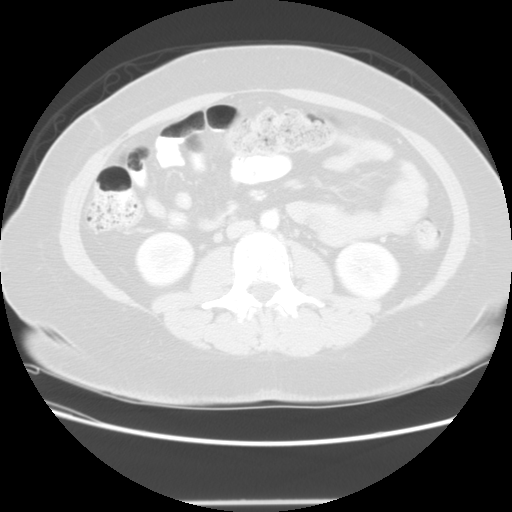
[im 51/82  soft-tissue]
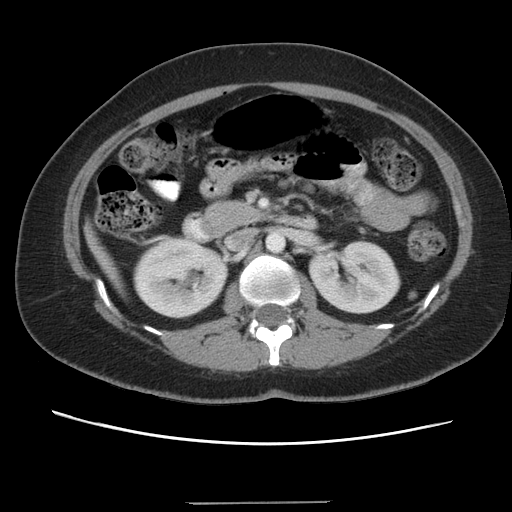
[im 51/82  lung]
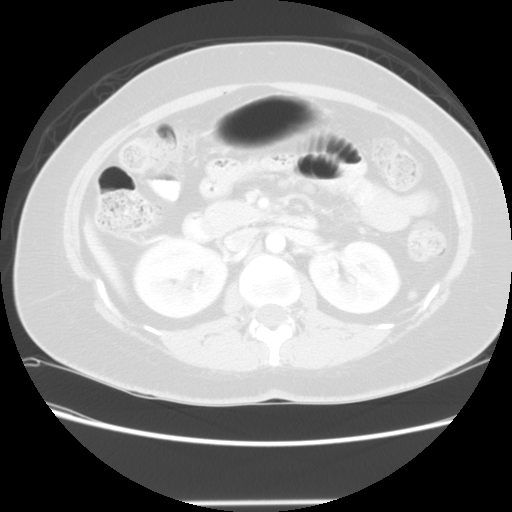
[im 61/82  soft-tissue]
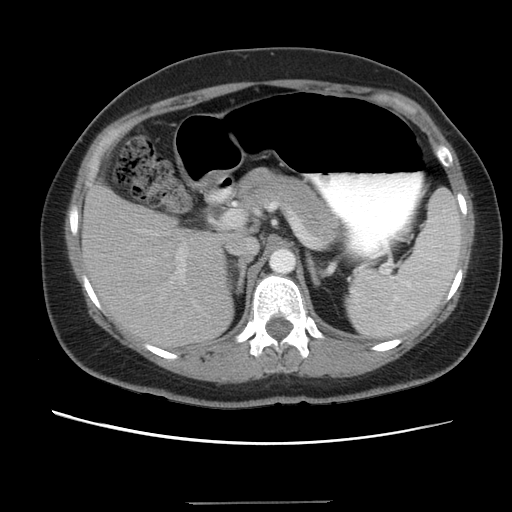
[im 61/82  lung]
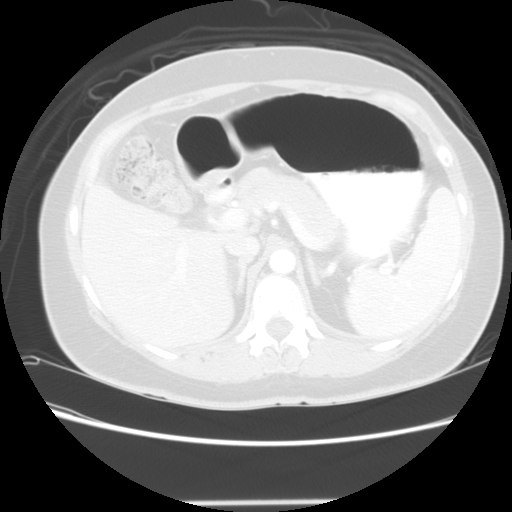
[im 71/82  soft-tissue]
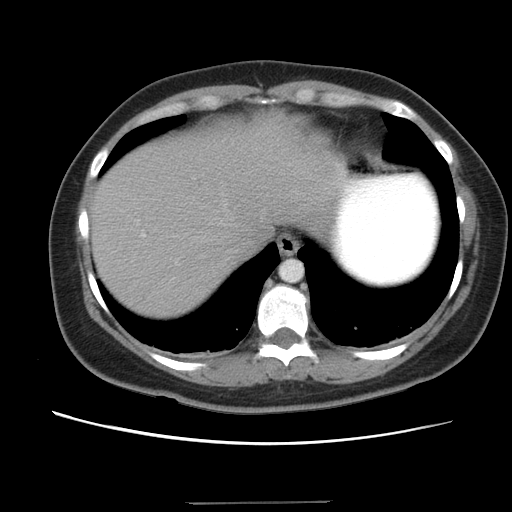
[im 71/82  lung]
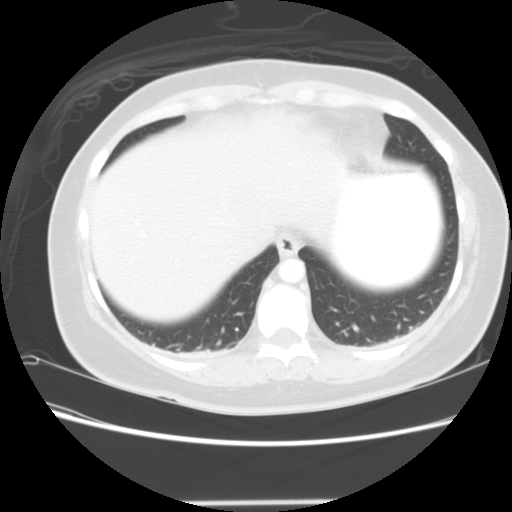

[Series 401: sag · sagittal · 0.94mm/px · 6 of 118 slices shown]
[im 11/118  soft-tissue]
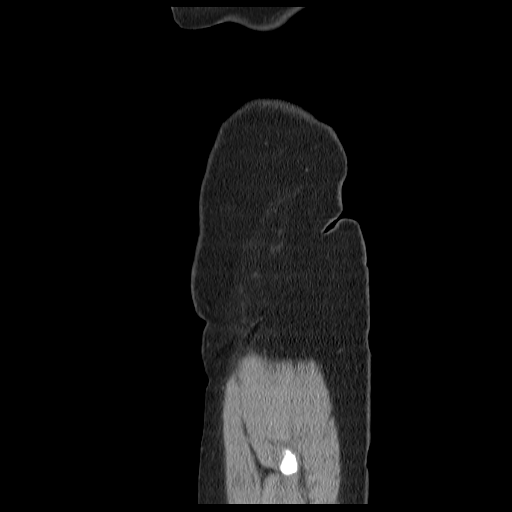
[im 22/118  soft-tissue]
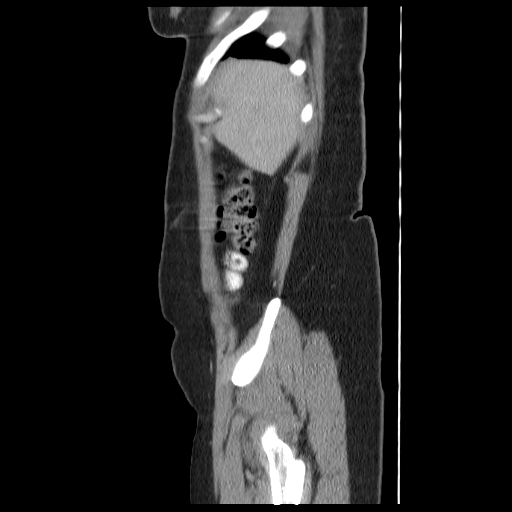
[im 43/118  soft-tissue]
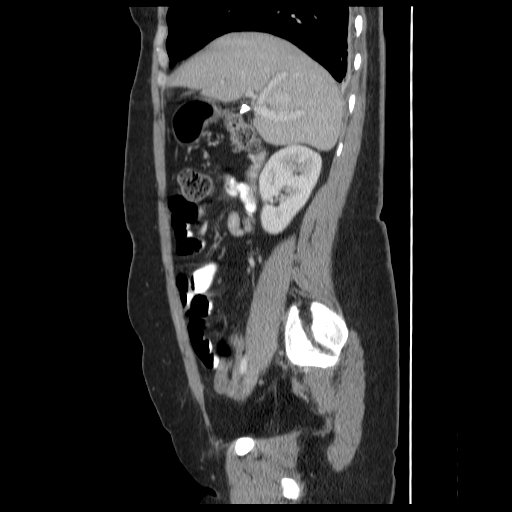
[im 54/118  soft-tissue]
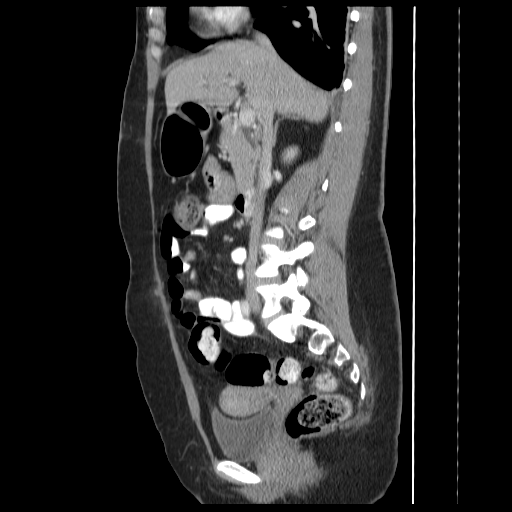
[im 64/118  soft-tissue]
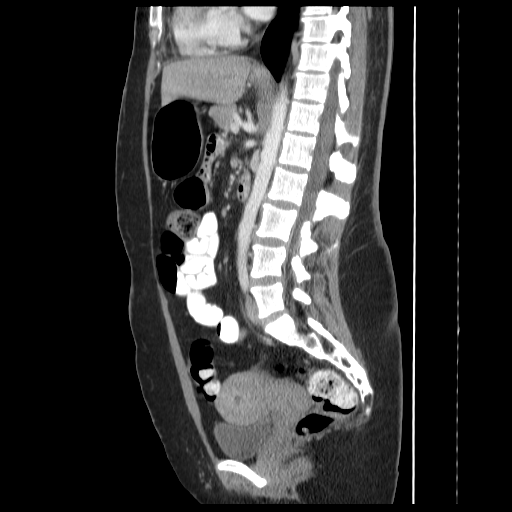
[im 75/118  soft-tissue]
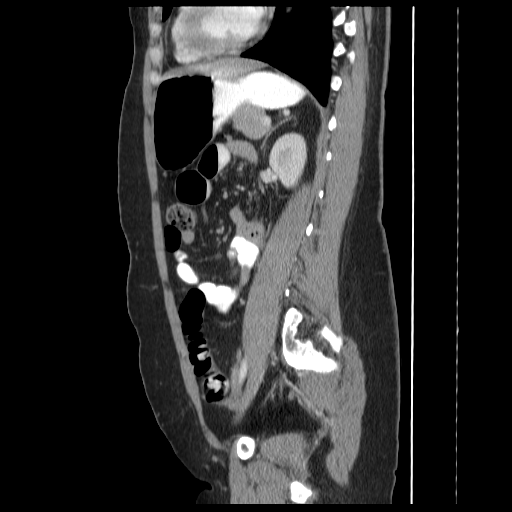

[13 of 32 positions shown; findings below may reference images not displayed]

FINDINGS: Minimal bibasilar atelectasis is noted.

The liver and spleen are unremarkable in appearance. The patient is
status post cholecystectomy, with clips noted at the gallbladder
fossa.  No associated free fluid is seen; there is no evidence of
fluid tracking along the right paracolic gutter.  No significant
associated soft tissue inflammation is appreciated.  No
intrahepatic biliary ductal dilatation is seen; the common hepatic
duct remains normal in caliber status post cholecystectomy.

The pancreas and adrenal glands are unremarkable.

There is a 0.7 cm hypodensity within the interpole region of the
right kidney, likely reflecting a small cyst.  The kidneys are
otherwise unremarkable in appearance.  There is no evidence of
hydronephrosis.  No renal or ureteral stones are identified.  No
perinephric stranding is seen.

The small bowel is unremarkable in appearance.  The stomach is
within normal limits.  No acute vascular abnormalities are seen.

The appendix is normal in caliber and contains contrast, without
evidence for appendicitis.  Contrast progresses to the level of the
proximal ascending colon.  The colon is partially filled with
stool, with residual contrast noted along the sigmoid colon.

The bladder is mildly distended and grossly unremarkable in
appearance.  The slightly unusual enhancement pattern to the uterus
likely reflects the phase of the ovulatory cycle.  The uterus
remains normal in size.

Mildly asymmetric trace free fluid is noted within the pelvis, more
prominent on the left.  There is a possible resolving cyst at the
left ovary.  The right ovary is unremarkable in appearance.  No
inguinal lymphadenopathy is seen; scattered left-sided inguinal
nodes remain normal in size.

No acute osseous abnormalities are identified.
IMPRESSION: 1.  Mildly asymmetric trace free fluid noted within the pelvis,
more prominent on the left.  Possible resolving cyst at the left
ovary.  Symptoms could reflect a recently ruptured ovarian cyst.
2.  Status post cholecystectomy; right upper quadrant unremarkable
in appearance.  No evidence of retained stones.  No inflammation or
bile leak characterized.
3.  Small right renal cyst noted.

## 2012-05-11 ENCOUNTER — Emergency Department (HOSPITAL_COMMUNITY)
Admission: EM | Admit: 2012-05-11 | Discharge: 2012-05-12 | Disposition: A | Discharge status: Home / Self Care | Payer: No Typology Code available for payment source | Attending: Emergency Medicine | Admitting: Emergency Medicine

## 2012-05-11 ENCOUNTER — Encounter (HOSPITAL_COMMUNITY): Payer: Self-pay | Admitting: *Deleted

## 2012-05-11 DIAGNOSIS — Z888 Allergy status to other drugs, medicaments and biological substances status: Secondary | ICD-10-CM | POA: Insufficient documentation

## 2012-05-11 DIAGNOSIS — T148XXA Other injury of unspecified body region, initial encounter: Secondary | ICD-10-CM

## 2012-05-11 DIAGNOSIS — Z881 Allergy status to other antibiotic agents status: Secondary | ICD-10-CM | POA: Insufficient documentation

## 2012-05-11 DIAGNOSIS — E119 Type 2 diabetes mellitus without complications: Secondary | ICD-10-CM | POA: Insufficient documentation

## 2012-05-11 DIAGNOSIS — Y92410 Unspecified street and highway as the place of occurrence of the external cause: Secondary | ICD-10-CM | POA: Insufficient documentation

## 2012-05-11 DIAGNOSIS — J45909 Unspecified asthma, uncomplicated: Secondary | ICD-10-CM | POA: Insufficient documentation

## 2012-05-11 MED ORDER — IBUPROFEN 800 MG PO TABS: 800.0000 mg | ORAL_TABLET | Freq: Three times a day (TID) | ORAL | Status: DC | PRN

## 2012-05-11 MED ORDER — CYCLOBENZAPRINE HCL 10 MG PO TABS: 10.0000 mg | ORAL_TABLET | Freq: Three times a day (TID) | ORAL | Status: DC | PRN
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
## 2012-05-12 NOTE — ED Notes (Signed)
Rx given x2 Pt ambulating independently w/ steady gait on d/c in no acute distress, A&Ox4. D/c instructions reviewed w/ pt and family - pt and family deny any further questions or concerns at present.  
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
## 2012-05-12 NOTE — ED Provider Notes (Signed)
Medical screening examination/treatment/procedure(s) were performed by non-physician practitioner and as supervising physician I was immediately available for consultation/collaboration.   Gwyneth Sprout, MD 05/12/12 0002

## 2012-05-24 ENCOUNTER — Ambulatory Visit (INDEPENDENT_AMBULATORY_CARE_PROVIDER_SITE_OTHER): Payer: BC Managed Care – PPO | Admitting: Emergency Medicine

## 2012-05-24 ENCOUNTER — Ambulatory Visit: Payer: BC Managed Care – PPO

## 2012-05-24 VITALS — BP 124/76 | HR 81 | Temp 98.3°F | Resp 17 | Ht 64.5 in | Wt 165.0 lb

## 2012-05-24 DIAGNOSIS — M79643 Pain in unspecified hand: Secondary | ICD-10-CM

## 2012-05-24 DIAGNOSIS — M79609 Pain in unspecified limb: Secondary | ICD-10-CM

## 2012-05-24 DIAGNOSIS — M79606 Pain in leg, unspecified: Secondary | ICD-10-CM
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
## 2012-07-22 ENCOUNTER — Ambulatory Visit (INDEPENDENT_AMBULATORY_CARE_PROVIDER_SITE_OTHER): Payer: 59 | Admitting: Family Medicine

## 2012-07-22 VITALS — BP 111/75 | HR 123 | Temp 99.2°F | Resp 18 | Ht 65.0 in | Wt 166.6 lb

## 2012-07-22 DIAGNOSIS — J111 Influenza due to unidentified influenza virus with other respiratory manifestations: Secondary | ICD-10-CM

## 2012-07-22 DIAGNOSIS — R05 Cough: Secondary | ICD-10-CM

## 2012-07-22 DIAGNOSIS — R059 Cough, unspecified: Secondary | ICD-10-CM

## 2012-07-22 DIAGNOSIS — R52 Pain, unspecified: Secondary | ICD-10-CM

## 2012-07-22 LAB — POCT CBC
Granulocyte percent: 81.1 %G — AB (ref 37–80)
MCV: 92.8 fL (ref 80–97)
MID (cbc): 0.5 (ref 0–0.9)
MPV: 7.6 fL (ref 0–99.8)
POC Granulocyte: 5.5 (ref 2–6.9)
Platelet Count, POC: 265 10*3/uL (ref 142–424)
RBC: 5.09 M/uL (ref 4.04–5.48)
RDW, POC: 13.3 %

## 2012-07-22 LAB — POCT INFLUENZA A/B: Influenza B, POC: NEGATIVE

## 2012-07-22 MED ORDER — OSELTAMIVIR PHOSPHATE 75 MG PO CAPS: 75.0000 mg | ORAL_CAPSULE | Freq: Two times a day (BID) | ORAL | Status: DC

## 2012-07-22 MED ORDER — HYDROCODONE-HOMATROPINE 5-1.5 MG/5ML PO SYRP: 5.0000 mL | ORAL_SOLUTION | ORAL | Status: DC | PRN

## 2012-07-22 NOTE — Progress Notes (Signed)
Subjective: 36 year old of healthy appearing lady who is here with a bad respiratory tract infection. She started getting sick Friday with discomfort when she coughed. That was not usual for her. He got suddenly worse Friday and Saturday. She started running fever. It was 101 this morning it she has body aches. She's had a lot of headache and behind the right eye and forehead area. She's had head congestion. No ear pain. If you feel stuffy. Postnasal drainage but no sore throat. Is starting to cough up some mucus. When she breathes the cough. 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 She did have a flu shot. She is taken some over-the-counter medications. Objective: Alert and oriented. TMs are normal. Nose congested. Not real specifically tender, just generally discomfort in her facial area. Her throat clear. Neck supple without significant nodes. No thyromegaly. Chest is clear despite coughing. Heart regular without murmurs.  Assessment: Head congestion Cough Suspicious for influenza  Plan: CBC and flu swab  Results for orders placed in visit on 07/22/12  POCT INFLUENZA A/B      Component Value Range   Influenza A, POC Positive     Influenza B, POC Negative    POCT CBC      Component Value Range   WBC 6.8  4.6 - 10.2 K/uL   Lymph, poc 0.8  0.6 - 3.4   POC LYMPH PERCENT 12.1  10 - 50 %L   MID (cbc) 0.5  0 - 0.9   POC MID % 6.8  0 - 12 %M   POC Granulocyte 5.5  2 - 6.9   Granulocyte percent 81.1 (*) 37 - 80 %G   RBC 5.09  4.04 - 5.48 M/uL   Hemoglobin 15.3  12.2 - 16.2 g/dL   HCT, POC 16.1  09.6 - 47.9 %   MCV 92.8  80 - 97 fL   MCH, POC 30.1  27 - 31.2 pg   MCHC 32.4  31.8 - 35.4 g/dL   RDW, POC 04.5     Platelet Count, POC 265  142 - 424 K/uL   MPV 7.6  0 - 99.8 fL

## 2012-07-22 NOTE — Patient Instructions (Signed)
Influenza, Adult Influenza ("the flu") is a viral infection of the respiratory tract. It occurs more often in winter months because people spend more time in close contact with one another. Influenza can make you feel very sick. Influenza easily spreads from person to person (contagious). CAUSES   Influenza is caused by a virus that infects the respiratory tract. You can catch the virus by breathing in droplets from an infected person's cough or sneeze. You can also catch the virus by touching something that was recently contaminated with the virus and then touching your mouth, nose, or eyes. SYMPTOMS   Symptoms typically last 4 to 10 days and may include:  Fever.   Chills.   Headache, body aches, and muscle aches.   Sore throat.   Chest discomfort and cough.   Poor appetite.   Weakness or feeling tired.   Dizziness.   Nausea or vomiting.  DIAGNOSIS   Diagnosis of influenza is often made based on your history and a physical exam. A nose or throat swab test can be done to confirm the diagnosis. RISKS AND COMPLICATIONS You may be at risk for a more severe case of influenza if you smoke cigarettes, have diabetes, have chronic heart disease (such as heart failure) or lung disease (such as asthma), or if you have a weakened immune system. Elderly people and pregnant women are also at risk for more serious infections. The most common complication of influenza is a lung infection (pneumonia). Sometimes, this complication can require emergency medical care and may be life-threatening. PREVENTION   An annual influenza vaccination (flu shot) is the best way to avoid getting influenza. An annual flu shot is now routinely recommended for all adults in the [place] TREATMENT   In mild cases, influenza goes away on its own. Treatment is directed at relieving symptoms. For more severe cases, your caregiver may prescribe antiviral medicines to shorten the sickness. Antibiotic medicines are not effective,  because the infection is caused by a virus, not by bacteria. HOME CARE INSTRUCTIONS  Only take over-the-counter or prescription medicines for pain, discomfort, or fever as directed by your caregiver.   Use a cool mist humidifier to make breathing easier.   Get plenty of rest until your temperature returns to normal. This usually takes 3 to 4 days.   Drink enough fluids to keep your urine clear or pale yellow.   Cover your mouth and nose when coughing or sneezing, and wash your hands well to avoid spreading the virus.   Stay home from work or school until your fever has been gone for at least 1 full day.  SEEK MEDICAL CARE IF:    You have chest pain or a deep cough that worsens or produces more mucus.   You have nausea, vomiting, or diarrhea.  SEEK IMMEDIATE MEDICAL CARE IF:    You have difficulty breathing, shortness of breath, or your skin or nails turn bluish.   You have severe neck pain or stiffness.   You have a severe headache, facial pain, or earache.   You have a worsening or recurring fever.   You have nausea or vomiting that cannot be controlled.  MAKE SURE YOU:  Understand these instructions.   Will watch your condition.   Will get help right away if you are not doing well or get worse.  Document Released: 07/15/2000 Document Revised: 01/17/2012 Document Reviewed: 10/17/2011 [employer] Patient Information 2013 [employer].    

## 2012-11-02 ENCOUNTER — Telehealth: Payer: Self-pay | Admitting: Family Medicine

## 2012-11-02 MED ORDER — DOXYCYCLINE HYCLATE 100 MG PO TABS: 100.0000 mg | ORAL_TABLET | Freq: Two times a day (BID) | ORAL | Status: DC

## 2012-11-02 NOTE — Telephone Encounter (Signed)
Called patient.  Instructed Doxycycline to be sent to pharmacy take for 10 days.  Let us know if not feeling better.

## 2012-11-02 NOTE — Telephone Encounter (Signed)
Actually, given her allergy, change to doxycycline 100 bid for 10 days.

## 2012-11-02 NOTE — Telephone Encounter (Signed)
Start z-pack for bronchitis.

## 2012-11-02 NOTE — Telephone Encounter (Signed)
Sick two weeks. Last two days has had fever, very harsh cough, back and neck ache.  Stuffy with Headache.  Running low grade fever.  Does not want to go to Urgent Care. What do you recommend?

## 2012-11-15 ENCOUNTER — Ambulatory Visit (INDEPENDENT_AMBULATORY_CARE_PROVIDER_SITE_OTHER): Payer: 59 | Admitting: Family Medicine

## 2012-11-15 ENCOUNTER — Encounter: Payer: Self-pay | Admitting: Family Medicine

## 2012-11-15 VITALS — BP 128/72 | HR 76 | Temp 98.4°F | Resp 16 | Wt 170.0 lb

## 2012-11-15 DIAGNOSIS — R509 Fever, unspecified: Secondary | ICD-10-CM

## 2012-11-15 DIAGNOSIS — R5383 Other fatigue: Secondary | ICD-10-CM

## 2012-11-15 DIAGNOSIS — E559 Vitamin D deficiency, unspecified: Secondary | ICD-10-CM

## 2012-11-15 DIAGNOSIS — R5381 Other malaise: Secondary | ICD-10-CM

## 2012-11-15 NOTE — Addendum Note (Signed)
Addended by: Reginia Forts on: 11/15/2012 02:44 PM   Modules accepted: Orders

## 2012-11-15 NOTE — Progress Notes (Signed)
  Subjective:    Patient ID: Candice Jones, female    DOB: 1975/08/30, 37 y.o.   MRN: [id number]  HPI  Patient presents with 4 months of recurrent infections. She has had a flu, gastroenteritis, shingles, and frequent upper respiratory infections.  She is concerned about a possible immunosuppression.  Is a significant family history of autoimmune hepatitis in her mother as well as lupus-like syndrome in her sister. She denies any weight loss, alopecia, depression, failure to thrive. She has had episodic fevers.  She is normally healthy person who does not come to the doctor very often she is just concerned.  Past Medical History  Diagnosis Date  . Asthma   . Diabetes mellitus   . Hyperlipidemia   . Chills   . Abdominal pain   . Constipation   . Nausea   . Constipation    Current Outpatient Prescriptions on File Prior to Visit  Medication Sig Dispense Refill  . cholecalciferol (VITAMIN D) 1000 UNITS tablet Take 1,000 Units by mouth daily.       . fluticasone (VERAMYST) 27.5 MCG/SPRAY nasal spray Place 2 sprays into the nose daily.      . Multiple Vitamin (MULTIVITAMIN WITH MINERALS) TABS Take 1 tablet by mouth daily.       No current facility-administered medications on file prior to visit.     Review of Systems  All other systems reviewed and are negative.       Objective:   Physical Exam  Constitutional: She is oriented to person, place, and time. She appears well-developed and well-nourished.  HENT:  Head: Normocephalic and atraumatic.  Right Ear: External ear normal.  Left Ear: External ear normal.  Nose: Nose normal.  Mouth/Throat: Oropharynx is clear and moist.  Neck: Normal range of motion. Neck supple. No thyromegaly present.  Cardiovascular: Normal rate, regular rhythm and normal heart sounds.   No murmur heard. Pulmonary/Chest: Effort normal and breath sounds normal. No respiratory distress. She has no wheezes. She has no rales. She exhibits no tenderness.   Abdominal: Soft. Bowel sounds are normal. She exhibits no distension and no mass. There is no tenderness. There is no rebound and no guarding.  Musculoskeletal: Normal range of motion. She exhibits no edema and no tenderness.  Lymphadenopathy:    She has no cervical adenopathy.  Neurological: She is alert and oriented to person, place, and time. She has normal reflexes. No cranial nerve deficit. Coordination normal.  Skin: Skin is warm and dry. No rash noted. No erythema. No pallor.          Assessment & Plan:  Other malaise and fatigue - Plan: Basic Metabolic Panel, CBC with Differential, Hepatic Function Panel, Sedimentation Rate, ANA w/Reflex if Positive, Vitamin D, 25-hydroxy, ANA  Fever, unspecified - Plan: Basic Metabolic Panel, CBC with Differential, Hepatic Function Panel, Sedimentation Rate, ANA w/Reflex if Positive, Vitamin D, 25-hydroxy, ANA  Vitamin D deficiency disease - Plan: Vitamin D, 25-hydroxy  Patient's exam is entirely normal, I reassured her I feel that this is most likely a coincidence. We will check some baseline screening labs including a CBC with differential, CMP, sedimentation rate, ANA, and vitamin D level. If these labs are normal we will clinically monitor for the next several months. If she continues to have infections particularly dangerous infections, we may want to consider doing Ig titers, complement levels, and possibly peripheral smear.

## 2012-11-15 NOTE — Addendum Note (Signed)
Addended by: Legrand Rams B on: 11/15/2012 02:56 PM   Modules accepted: Orders

## 2012-11-16 LAB — ANTI-NUCLEAR AB-TITER (ANA TITER): ANA Titer 1: 1:40 {titer} — ABNORMAL HIGH

## 2012-11-16 LAB — CBC WITH DIFFERENTIAL/PLATELET
Eosinophils Absolute: 0.1 10*3/uL (ref 0.0–0.7)
Eosinophils Relative: 1 % (ref 0–5)
HCT: 42 % (ref 36.0–46.0)
Hemoglobin: 14.9 g/dL (ref 12.0–15.0)
Lymphocytes Relative: 24 % (ref 12–46)
Lymphs Abs: 2.3 10*3/uL (ref 0.7–4.0)
MCH: 29.9 pg (ref 26.0–34.0)
MCV: 84.2 fL (ref 78.0–100.0)
Monocytes Relative: 5 % (ref 3–12)
RBC: 4.99 MIL/uL (ref 3.87–5.11)
WBC: 9.5 10*3/uL (ref 4.0–10.5)

## 2012-11-16 LAB — BASIC METABOLIC PANEL
BUN: 10 mg/dL (ref 6–23)
Chloride: 102 mEq/L (ref 96–112)
Potassium: 4.3 mEq/L (ref 3.5–5.3)
Sodium: 137 mEq/L (ref 135–145)

## 2012-11-16 LAB — HEPATIC FUNCTION PANEL
AST: 19 U/L (ref 0–37)
Albumin: 4.4 g/dL (ref 3.5–5.2)
Alkaline Phosphatase: 71 U/L (ref 39–117)
Indirect Bilirubin: 0.4 mg/dL (ref 0.0–0.9)
Total Protein: 6.8 g/dL (ref 6.0–8.3)

## 2012-11-16 LAB — VITAMIN D 25 HYDROXY (VIT D DEFICIENCY, FRACTURES): Vit D, 25-Hydroxy: 49 ng/mL (ref 30–89)

## 2012-11-21 ENCOUNTER — Telehealth: Payer: Self-pay | Admitting: Family Medicine

## 2012-11-21 NOTE — Telephone Encounter (Signed)
Can we see her Friday?

## 2012-11-21 NOTE — Telephone Encounter (Signed)
Pt states she is still having low grade temp of 99.6 having severe breast tenderness since Friday night, some nerve pain from shingles in right leg for 2 days, she is taking aleve to ease it..what is the next step?

## 2012-11-22 NOTE — Telephone Encounter (Signed)
Pt was called and is coming in Friday

## 2012-11-23 ENCOUNTER — Encounter: Payer: Self-pay | Admitting: Family Medicine

## 2012-11-23 ENCOUNTER — Ambulatory Visit (INDEPENDENT_AMBULATORY_CARE_PROVIDER_SITE_OTHER): Payer: 59 | Admitting: Family Medicine

## 2012-11-23 VITALS — BP 120/74 | HR 88 | Temp 98.5°F | Resp 16 | Wt 172.0 lb

## 2012-11-23 DIAGNOSIS — M791 Myalgia, unspecified site: Secondary | ICD-10-CM

## 2012-11-23 DIAGNOSIS — IMO0001 Reserved for inherently not codable concepts without codable children: Secondary | ICD-10-CM

## 2012-11-23 DIAGNOSIS — N644 Mastodynia: Secondary | ICD-10-CM

## 2012-11-23 DIAGNOSIS — M2550 Pain in unspecified joint: Secondary | ICD-10-CM

## 2012-11-23 NOTE — Progress Notes (Signed)
Subjective:    Patient ID: Candice Jones, female    DOB: 03-02-1976, 37 y.o.   MRN: [id number]  HPI   Patient presents with 4 months of recurrent infections. She has had a flu, gastroenteritis, shingles, and frequent upper respiratory infections.  She is concerned about a possible immunosuppression.  Is a significant family history of autoimmune hepatitis in her mother as well as lupus-like syndrome in her sister. She denies any weight loss, alopecia, depression, failure to thrive. She has had episodic fevers.  She is normally healthy person who does not come to the doctor very often she is just concerned.  Patient's exam is entirely normal, I reassured her I feel that this is most likely a coincidence. We checked some baseline screening labs including a CBC with differential, CMP, sedimentation rate, ANA, and vitamin D level.   11/22/12 She did return with a positive ANA, but the titer was extremely low at 1:40.  Furthermore her ESR was 4.  However since I saw her last time she has now developed low-grade fevers 99-100, bilateral breast tenderness, pain and stiffness and achiness in her shoulders hips back and knees.  She is here today for evaluation. There is no redness, warmth, or swelling in her breast. There is no axillary adenopathy.  Past Medical History  Diagnosis Date  . Asthma   . Diabetes mellitus   . Hyperlipidemia   . Chills   . Abdominal pain   . Constipation   . Nausea   . Constipation    Current Outpatient Prescriptions on File Prior to Visit  Medication Sig Dispense Refill  . cholecalciferol (VITAMIN D) 1000 UNITS tablet Take 1,000 Units by mouth daily.       . fluticasone (VERAMYST) 27.5 MCG/SPRAY nasal spray Place 2 sprays into the nose daily.      . Multiple Vitamin (MULTIVITAMIN WITH MINERALS) TABS Take 1 tablet by mouth daily.       No current facility-administered medications on file prior to visit.     Review of Systems  All other systems reviewed and are  negative.       Objective:   Physical Exam  Constitutional: She is oriented to person, place, and time. She appears well-developed and well-nourished.  HENT:  Head: Normocephalic and atraumatic.  Right Ear: External ear normal.  Left Ear: External ear normal.  Nose: Nose normal.  Mouth/Throat: Oropharynx is clear and moist.  Neck: Normal range of motion. Neck supple. No thyromegaly present.  Cardiovascular: Normal rate, regular rhythm and normal heart sounds.   No murmur heard. Pulmonary/Chest: Effort normal and breath sounds normal. No respiratory distress. She has no wheezes. She has no rales. She exhibits no tenderness.  Abdominal: Soft. Bowel sounds are normal. She exhibits no distension and no mass. There is no tenderness. There is no rebound and no guarding.  Musculoskeletal: Normal range of motion. She exhibits no edema and no tenderness.  Lymphadenopathy:    She has no cervical adenopathy.  Neurological: She is alert and oriented to person, place, and time. She has normal reflexes. No cranial nerve deficit. Coordination normal.  Skin: Skin is warm and dry. No rash noted. No erythema. No pallor.          Assessment & Plan:  Myalgia  Arthralgia  Mastalgia in female  Told the patient I feel that this is either a low grade viral syndrome or hormonally related to menstruation. Asked tincture of time of 3-4 more days. If the symptoms resolve it  was likely viral. If she begins her period, It was likely hormonal. If the fever, myalgias, arthralgias persist, I would get a chest x-ray, anti ds-DNA antibodies, anti Smith abs, C3, C4 levels to evaluate for immunologic signs of lupus.  However she does not have any past history of pericarditis, pleurisy, elevated liver function tests, or muscle/joint pains.

## 2013-03-01 ENCOUNTER — Ambulatory Visit (INDEPENDENT_AMBULATORY_CARE_PROVIDER_SITE_OTHER): Payer: 59 | Admitting: Family Medicine

## 2013-03-01 ENCOUNTER — Encounter: Payer: Self-pay | Admitting: Family Medicine

## 2013-03-01 VITALS — BP 110/80 | HR 80 | Temp 97.6°F | Resp 16 | Wt 175.0 lb

## 2013-03-01 DIAGNOSIS — R7301 Impaired fasting glucose: Secondary | ICD-10-CM | POA: Insufficient documentation

## 2013-03-01 DIAGNOSIS — N809 Endometriosis, unspecified: Secondary | ICD-10-CM

## 2013-03-01 DIAGNOSIS — N390 Urinary tract infection, site not specified: Secondary | ICD-10-CM

## 2013-03-01 DIAGNOSIS — Z3200 Encounter for pregnancy test, result unknown: Secondary | ICD-10-CM

## 2013-03-01 LAB — URINALYSIS, ROUTINE W REFLEX MICROSCOPIC
Bilirubin Urine: NEGATIVE
Hgb urine dipstick: NEGATIVE
Ketones, ur: NEGATIVE mg/dL
Nitrite: NEGATIVE
pH: 7 (ref 5.0–8.0)

## 2013-03-01 NOTE — Progress Notes (Signed)
  Subjective:    Patient ID: Candice Jones, female    DOB: Dec 01, 1975, 37 y.o.   MRN: [id number]  HPI Patient presents with chronic pelvic left lower abdominal pain. She is has a working diagnosis of endometriosis given by her GYN. She's been tried on ibuprofen high dose as well as tramadol without any improvement. She was given birth control pills which she was to start this Sunday. She's also had urinary frequency associated with her typical pain. The pain is worse around her cycles but she now has pain that occasionally radiates to her left thigh for the past couple of months. She was asking for pregnancy test even though she's had ablation and her husband has had vasectomy because of the urinary frequency. She denies any polydipsia her chart does note she had gestational diabetes.   Review of Systems  GEN- denies fatigue, fever, weight loss,weakness, recent illness HEENT- denies eye drainage, change in vision, nasal discharge, CVS- denies chest pain, palpitations RESP- denies SOB, cough, wheeze ABD- denies N/V, change in stools, abd pain, +pelvic pain GU- denies dysuria, hematuria, dribbling, incontinence, + frequency Neuro- denies headache, dizziness, syncope, seizure activity      Objective:   Physical Exam GEN- NAD, alert and oriented x3 CVS- RRR, no murmur RESP-CTAB ABD-NABS,soft,mild TTP LLQ- pelvic region, no rebound, no gaurding,no mass felt, no CVA tenderness EXT- No edema Pulses- Radial 2+        Assessment & Plan:

## 2013-03-01 NOTE — Patient Instructions (Signed)
A1C is 5%, you are not diabetes Start he hormone therapy If not improved CT of abdomen and pelvis to be done

## 2013-03-03 DIAGNOSIS — N809 Endometriosis, unspecified: Secondary | ICD-10-CM | POA: Insufficient documentation

## 2013-03-03 NOTE — Assessment & Plan Note (Signed)
She has had gestational DM, A1C normal, given reassurance

## 2013-03-03 NOTE — Assessment & Plan Note (Signed)
Working diagnosis of endometriosis as cause of chronic lower abdominal and pelvic pain We discussed next step, would recommend starting the OCP to see if pain improves If not would move to imaging with CT abd/pelvis,she has had transvaginal US done by GYN Reviewed last set of labs wit pt also unremarkable UA neg

## 2013-04-23 ENCOUNTER — Ambulatory Visit (INDEPENDENT_AMBULATORY_CARE_PROVIDER_SITE_OTHER): Payer: 59 | Admitting: *Deleted

## 2013-04-23 VITALS — Temp 97.6°F

## 2013-04-23 DIAGNOSIS — Z23 Encounter for immunization: Secondary | ICD-10-CM

## 2013-08-01 HISTORY — PX: HEMORRHOID BANDING: SHX5850

## 2013-11-28 ENCOUNTER — Encounter: Payer: Self-pay | Admitting: Physician Assistant

## 2013-11-28 ENCOUNTER — Ambulatory Visit (INDEPENDENT_AMBULATORY_CARE_PROVIDER_SITE_OTHER): Payer: 59 | Admitting: Physician Assistant

## 2013-11-28 VITALS — BP 120/82 | HR 64 | Temp 98.5°F | Resp 18 | Wt 177.0 lb

## 2013-11-28 DIAGNOSIS — L209 Atopic dermatitis, unspecified: Secondary | ICD-10-CM

## 2013-11-28 DIAGNOSIS — L2089 Other atopic dermatitis: Secondary | ICD-10-CM

## 2013-11-28 MED ORDER — CLOTRIMAZOLE-BETAMETHASONE 1-0.05 % EX CREA: 1.0000 "application " | TOPICAL_CREAM | Freq: Two times a day (BID) | CUTANEOUS | Status: DC

## 2013-11-28 NOTE — Progress Notes (Signed)
Patient ID: Candice Jones [place] MRN: [id number], DOB: 08/14/1975, 38 y.o. Date of Encounter: 11/28/2013, 11:59 AM    Chief Complaint:  Chief Complaint  Patient presents with  . red itchy spot on left shin     HPI: 38 y.o. year old white female says that it was the last week in March when she first noticed this lesion.  Says she happened to wear Capri pants that day so she noticed it. However, says it is possible that the lesion could have been there prior to that and she just hadn't noticed it until then. Prior to that, she had been wearing long pants mostly. Also says that when she is in the shower she does not have on her glasses and would not necessarily have seen it at that time. Neverthe less, she first noticed this  the last week of March. Says that  compared to its appearance at that time-- it appears about the same.  Says it has not been itchy at all until just yesterday.  Says that her son has eczema and has some prescription strength cortisone at home. She did apply that to the lesion for 2 or 3 days. However she saw no improvement so she stopped using that.  Yesterday she had itching so she did apply some Benadryl cream and it did help with the itching. Has Used no other treatments.     Home Meds: See attached medication section for any medications that were entered at today's visit. The computer does not put those onto this list.The following list is a list of meds entered prior to today's visit.   Current Outpatient Prescriptions on File Prior to Visit  Medication Sig Dispense Refill  . cholecalciferol (VITAMIN D) 1000 UNITS tablet Take 1,000 Units by mouth daily.       . fluticasone (VERAMYST) 27.5 MCG/SPRAY nasal spray Place 2 sprays into the nose daily.      Marland Kitchen. levonorgestrel-ethinyl estradiol (SEASONALE,INTROVALE,JOLESSA) 0.15-0.03 MG tablet Take 1 tablet by mouth daily.      . Multiple Vitamin (MULTIVITAMIN WITH MINERALS) TABS Take 1 tablet by mouth daily.       No current  facility-administered medications on file prior to visit.    Allergies:  Allergies  Allergen Reactions  . Amoxicillin-Pot Clavulanate     Vomiting   . Ciprofloxacin Hcl     Arm turned red  . Erythromycin     Rash   . Sulfur     rash      Review of Systems: See HPI for pertinent ROS. All other ROS negative.    Physical Exam: Blood pressure 120/82, pulse 64, temperature 98.5 F (36.9 C), temperature source Oral, resp. rate 18, weight 177 lb (80.287 kg)., Body mass index is 29.45 kg/(Jones^2). General: WNWD WF.  Appears in no acute distress. Lungs: Clear bilaterally to auscultation without wheezes, rales, or rhonchi. Breathing is unlabored. Heart: Regular rhythm. No murmurs, rubs, or gallops. Msk:  Strength and tone normal for age. Skin: She is fair skinned with reddish hair. On the left shin at the anteromedial aspect there is a approximate 1/2-1 cm diameter lesion that is barely raised. It is salmon colored. Neuro: Alert and oriented X 3. Moves all extremities spontaneously. Gait is normal. CNII-XII grossly in tact. Psych:  Responds to questions appropriately with a normal affect.     ASSESSMENT AND PLAN:  38 y.o. year old female with  1. Atopic dermatitis Discussed with her that most common lesions are secondary to  save some type of atopic dermatitis or fungal lesions. Discussed going ahead and doing a biopsy today. However I think it would best to do a trial of a medication to cover both the above first. She is to apply this medication twice every single day for the next 2 weeks. If it does not resolve with this then return to clinic and we can do a biopsy. - clotrimazole-betamethasone (LOTRISONE) cream; Apply 1 application topically 2 (two) times daily.  Dispense: 30 g; Refill: 0   Signed, [address]Mary Beth WestvilleDixon, [place]PA, [facility] 11/28/2013 11:59 AM

## 2014-01-22 ENCOUNTER — Ambulatory Visit (INDEPENDENT_AMBULATORY_CARE_PROVIDER_SITE_OTHER): Payer: 59 | Admitting: Family Medicine

## 2014-01-22 VITALS — BP 122/70 | HR 106 | Temp 98.4°F | Resp 18 | Ht 65.0 in | Wt 176.0 lb

## 2014-01-22 DIAGNOSIS — H8113 Benign paroxysmal vertigo, bilateral: Secondary | ICD-10-CM

## 2014-01-22 DIAGNOSIS — H8110 Benign paroxysmal vertigo, unspecified ear: Secondary | ICD-10-CM

## 2014-01-22 DIAGNOSIS — E162 Hypoglycemia, unspecified: Secondary | ICD-10-CM

## 2014-01-22 DIAGNOSIS — R5381 Other malaise: Secondary | ICD-10-CM

## 2014-01-22 DIAGNOSIS — R5383 Other fatigue: Secondary | ICD-10-CM

## 2014-01-22 LAB — POCT CBC
Granulocyte percent: 71.7 %G (ref 37–80)
HCT, POC: 48.4 % — AB (ref 37.7–47.9)
Hemoglobin: 15.9 g/dL (ref 12.2–16.2)
Lymph, poc: 2.1 (ref 0.6–3.4)
MCH: 30.3 pg (ref 27–31.2)
MCHC: 32.9 g/dL (ref 31.8–35.4)
MCV: 92.3 fL (ref 80–97)
MID (CBC): 0.4 (ref 0–0.9)
MPV: 8.1 fL (ref 0–99.8)
PLATELET COUNT, POC: 365 10*3/uL (ref 142–424)
POC Granulocyte: 6.2 (ref 2–6.9)
POC LYMPH PERCENT: 24 %L (ref 10–50)
POC MID %: 4.3 %M (ref 0–12)
RBC: 5.24 M/uL (ref 4.04–5.48)
RDW, POC: 13.5 %
WBC: 8.6 10*3/uL (ref 4.6–10.2)

## 2014-01-22 LAB — COMPREHENSIVE METABOLIC PANEL
ALBUMIN: 4.7 g/dL (ref 3.5–5.2)
ALT: 19 U/L (ref 0–35)
AST: 16 U/L (ref 0–37)
Alkaline Phosphatase: 58 U/L (ref 39–117)
BUN: 10 mg/dL (ref 6–23)
CALCIUM: 9.5 mg/dL (ref 8.4–10.5)
CO2: 25 meq/L (ref 19–32)
CREATININE: 0.61 mg/dL (ref 0.50–1.10)
Chloride: 105 mEq/L (ref 96–112)
Glucose, Bld: 128 mg/dL — ABNORMAL HIGH (ref 70–99)
Potassium: 4.4 mEq/L (ref 3.5–5.3)
SODIUM: 140 meq/L (ref 135–145)
TOTAL PROTEIN: 7.5 g/dL (ref 6.0–8.3)
Total Bilirubin: 0.4 mg/dL (ref 0.2–1.2)

## 2014-01-22 LAB — GLUCOSE, POCT (MANUAL RESULT ENTRY): POC Glucose: 124 mg/dl — AB (ref 70–99)

## 2014-01-22 LAB — TSH: TSH: 1.183 u[IU]/mL (ref 0.350–4.500)

## 2014-01-22 LAB — POCT GLYCOSYLATED HEMOGLOBIN (HGB A1C): Hemoglobin A1C: 5.2

## 2014-01-22 LAB — POCT URINE PREGNANCY: Preg Test, Ur: NEGATIVE

## 2014-01-22 NOTE — Patient Instructions (Signed)
The cause of your symptoms is not entirely clear today.  You may have had an episode of "BPPY," a common cause of vertigo (spinning sensation).   Let me know if you continue to have any difficulty.  For the time being work on hydration, and be sure to have a snack on hand to eat in case of any feelings of low blood sugar. Eat a little bit every few hours until you are feeling better.

## 2014-01-22 NOTE — Progress Notes (Addendum)
[facility] [address] [place] [address] [phone]  Date:  01/22/2014   Name:  Candice Jones   DOB:  06/12/1976   MRN:  [id number]  PCP:  Leo Grosser, MD    Chief Complaint: Night Sweats, Dizziness and Fatigue   History of Present Illness:  Candice Jones is a 38 y.o. very pleasant female patient who presents with the following:  She is here today with illness.  Last night she awoke around midnight and felt hot.  She rolled over in bed and felt quite dizzy.  She propped herself up in the bed and continued to feel dizzy. She woke her husband up, and he noticed that she looked pale and was sweating. She continued to feel ill for about one hour, and her husband called the [place] health care nurse line.  She had a BM- loose, not typical for her.  She then drank some juice and then had an episode of vomiting.  She felt a little better after that, but felt very weak.  The dizziness lasted for a couple of hours- it was vertigo, not lightheadedness.  She would be better if she help quite still- moving her head and turning over made it worse.    She is here today with her mom for an evaluation.  Right now she may feel a little dizzy if she moves her head but overall her sx are much better.   She has noted some sx of ?hypoglycemia here recently.  Sometimes she will feel weak and dizzy, like she might pass out if she does not eat.  Eating resolves these sx.    She had gestational DM, but this resolved with delivery.  She has noted some occasional low blood sugar over the years but these sx are more frequent recently.   She does notice a frontal HA, feels warm.    Also, apparently she has had a "low grade fever" for about one year.  She saw her OBG and was dx with possible hormonal imbalance. Otherwise no cause has been noted for her fevers.     She may get up to 99.8 sometimes- not higher   Patient Active Problem List   Diagnosis Date Noted  . Endometriosis  03/03/2013  . Elevated fasting glucose 03/01/2013  . Gallstones 05/12/2011    Past Medical History  Diagnosis Date  . Asthma   . Diabetes mellitus   . Hyperlipidemia   . Chills   . Abdominal pain   . Constipation   . Nausea   . Constipation     Past Surgical History  Procedure Laterality Date  . Endometrial ablation  01/2010  . Cesarean section  08/1999, 05/2003  . Cystectomy  1997    left wrist   . Wisdom tooth extraction  1996/1997  . Cholecystectomy      History  Substance Use Topics  . Smoking status: Never Smoker   . Smokeless tobacco: Never Used  . Alcohol Use: No    Family History  Problem Relation Age of Onset  . High Cholesterol Mother   . Hypertension Mother   . Heart disease Mother   . Diabetes Father   . High Cholesterol Father   . Diabetes Maternal Grandmother   . Heart disease Maternal Grandmother   . Hypertension Maternal Grandmother   . High Cholesterol Maternal Grandmother   . Heart disease Maternal Grandfather   . High Cholesterol Maternal Grandfather   . Cancer Maternal Grandfather   .  Diabetes Paternal Grandmother   . Cancer Paternal Grandfather     prostate  . Lupus Sister     Allergies  Allergen Reactions  . Amoxicillin-Pot Clavulanate     Vomiting   . Ciprofloxacin Hcl     Arm turned red  . Codeine Other (See Comments)    Makes pain worse  . Erythromycin     Rash   . Sulfur     rash    Medication list has been reviewed and updated.  Current Outpatient Prescriptions on File Prior to Visit  Medication Sig Dispense Refill  . cholecalciferol (VITAMIN D) 1000 UNITS tablet Take 1,000 Units by mouth daily.       . clotrimazole-betamethasone (LOTRISONE) cream Apply 1 application topically 2 (two) times daily.  30 g  0  . fluticasone (VERAMYST) 27.5 MCG/SPRAY nasal spray Place 2 sprays into the nose daily.      Marland Kitchen. ibuprofen (ADVIL,MOTRIN) 800 MG tablet Take 800 mg by mouth every 6 (six) hours as needed.      Marland Kitchen.  levonorgestrel-ethinyl estradiol (SEASONALE,INTROVALE,JOLESSA) 0.15-0.03 MG tablet Take 1 tablet by mouth daily.      . Multiple Vitamin (MULTIVITAMIN WITH MINERALS) TABS Take 1 tablet by mouth daily.       No current facility-administered medications on file prior to visit.    Review of Systems:  As per HPI- otherwise negative.   Physical Examination: Filed Vitals:   01/22/14 0915  BP: 122/70  Pulse: 106  Temp: 98.4 F (36.9 C)  Resp: 18   Filed Vitals:   01/22/14 0915  Height: 5\' 5"  (1.651 m)  Weight: 176 lb (79.833 kg)   Body mass index is 29.29 kg/(m^2). Ideal Body Weight: Weight in (lb) to have BMI = 25: 149.9  GEN: WDWN, NAD, Non-toxic, A & O x 3, looks well HEENT: Atraumatic, Normocephalic. Neck supple. No masses, No LAD.  Bilateral TM wnl, oropharynx normal.  PEERL,EOMI.   Ears and Nose: No external deformity. CV: RRR, No M/G/R. No JVD. No thrill. No extra heart sounds. PULM: CTA B, no wheezes, crackles, rhonchi. No retractions. No resp. distress. No accessory muscle use. ABD: S, NT, ND, +BS. No rebound. No HSM. EXTR: No c/c/e NEURO Normal gait. Complete neuro exam including sensation, strength all extremities and face, DTR all extremities, and cerebellar function is normal.  Negative Dix- Hallpike bilaterally  PSYCH: Normally interactive. Conversant. Not depressed or anxious appearing.  Calm demeanor.  Results for orders placed in visit on 01/22/14  POCT CBC      Result Value Ref Range   WBC 8.6  4.6 - 10.2 K/uL   Lymph, poc 2.1  0.6 - 3.4   POC LYMPH PERCENT 24.0  10 - 50 %L   MID (cbc) 0.4  0 - 0.9   POC MID % 4.3  0 - 12 %M   POC Granulocyte 6.2  2 - 6.9   Granulocyte percent 71.7  37 - 80 %G   RBC 5.24  4.04 - 5.48 M/uL   Hemoglobin 15.9  12.2 - 16.2 g/dL   HCT, POC 16.148.4 (*) 09.637.7 - 47.9 %   MCV 92.3  80 - 97 fL   MCH, POC 30.3  27 - 31.2 pg   MCHC 32.9  31.8 - 35.4 g/dL   RDW, POC 04.513.5     Platelet Count, POC 365  142 - 424 K/uL   MPV 8.1  0 - 99.8  fL  GLUCOSE, POCT (MANUAL RESULT ENTRY)  Result Value Ref Range   POC Glucose 124 (*) 70 - 99 mg/dl  POCT GLYCOSYLATED HEMOGLOBIN (HGB A1C)      Result Value Ref Range   Hemoglobin A1C 5.2    POCT URINE PREGNANCY      Result Value Ref Range   Preg Test, Ur Negative     EKG:  NSR, no ST elevation or depression and no arrythmia.    Lying:   119/75, 80 Sitting:  124/79, 88 Standing:   117/82, 99 Assessment and Plan: Benign paroxysmal positional vertigo, bilateral - Plan: EKG 12-Lead  Hypoglycemia - Plan: POCT glucose (manual entry), POCT glycosylated hemoglobin (Hb A1C)  Other malaise and fatigue - Plan: POCT CBC, POCT urine pregnancy, Comprehensive metabolic panel, TSH, EKG 12-Lead  Episode of possible BPPV.  Discussed with pt in detail.  At this time she is feeling basically back to normal and her labs/ eval so far is reassuring.  She will be sure to hydrate well, eat frequently and take it easy for a few days.  She will let me know if her sx return.   Will plan further follow- up pending labs.   Signed Abbe AmsterdamJessica Copland, MD  [facility] the rest [facility] labs [facility] 6[facility]25[facility] that these are normal and will send a copy to her

## 2014-01-23 ENCOUNTER — Encounter: Payer: Self-pay | Admitting: Family Medicine

## 2014-03-20 ENCOUNTER — Ambulatory Visit (INDEPENDENT_AMBULATORY_CARE_PROVIDER_SITE_OTHER): Payer: 59 | Admitting: Family Medicine

## 2014-03-20 ENCOUNTER — Encounter: Payer: Self-pay | Admitting: Family Medicine

## 2014-03-20 VITALS — BP 100/66 | HR 96 | Temp 98.4°F | Resp 16 | Ht 65.0 in | Wt 177.0 lb

## 2014-03-20 DIAGNOSIS — R42 Dizziness and giddiness: Secondary | ICD-10-CM

## 2014-03-20 DIAGNOSIS — H93239 Hyperacusis, unspecified ear: Secondary | ICD-10-CM

## 2014-03-21 ENCOUNTER — Encounter: Payer: Self-pay | Admitting: Family Medicine

## 2014-03-21 NOTE — Progress Notes (Signed)
Subjective:    Patient ID: Candice Jones, female    DOB: 13-Dec-1975, 38 y.o.   MRN: [id number]  HPI On June 24, the patient developed sudden vertigo.  The vertigo was constant.  It was not related to position changes according to the patient at this time.  However the vertigo has gradually subsided. Ever since that time she had a pressure-like sensation in her ear. She states it feels as though she is having a difficult time hearing as if "her head is underwater."  She also reports tinnitus that time sounds like a bellringing and at other times it sounds like a fluttering sound.  She has tried Mucinex, Flonase, veramyst, and sudafed without imrpovement.  At the same time she has also developed severe sensitivity to sound. Sounds actually cause pain.  She states that she is able to hear crickets and humming sounds in the background which others cannot appreciate.  Her hearing screen is normal today. Past Medical History  Diagnosis Date  . Asthma   . Diabetes mellitus   . Hyperlipidemia   . Chills   . Abdominal pain   . Constipation   . Nausea   . Constipation    Past Surgical History  Procedure Laterality Date  . Endometrial ablation  01/2010  . Cesarean section  08/1999, 05/2003  . Cystectomy  1997    left wrist   . Wisdom tooth extraction  1996/1997  . Cholecystectomy     Current Outpatient Prescriptions on File Prior to Visit  Medication Sig Dispense Refill  . cholecalciferol (VITAMIN D) 1000 UNITS tablet Take 1,000 Units by mouth daily.       . clotrimazole-betamethasone (LOTRISONE) cream Apply 1 application topically 2 (two) times daily.  30 g  0  . fluticasone (VERAMYST) 27.5 MCG/SPRAY nasal spray Place 2 sprays into the nose daily.      Marland Kitchen ibuprofen (ADVIL,MOTRIN) 800 MG tablet Take 800 mg by mouth every 6 (six) hours as needed.      Marland Kitchen levonorgestrel-ethinyl estradiol (SEASONALE,INTROVALE,JOLESSA) 0.15-0.03 MG tablet Take 1 tablet by mouth daily.      . Multiple Vitamin  (MULTIVITAMIN WITH MINERALS) TABS Take 1 tablet by mouth daily.       No current facility-administered medications on file prior to visit.   Allergies  Allergen Reactions  . Amoxicillin-Pot Clavulanate     Vomiting   . Ciprofloxacin Hcl     Arm turned red  . Codeine Other (See Comments)    Makes pain worse  . Erythromycin     Rash   . Sulfur     rash   History   Social History  . Marital Status: Married    Spouse Name: N/A    Number of Children: N/A  . Years of Education: N/A   Occupational History  . Not on file.   Social History Main Topics  . Smoking status: Never Smoker   . Smokeless tobacco: Never Used  . Alcohol Use: No  . Drug Use: No  . Sexual Activity: Yes   Other Topics Concern  . Not on file   Social History Narrative  . No narrative on file      Review of Systems  All other systems reviewed and are negative.      Objective:   Physical Exam  Vitals reviewed. Constitutional: She appears well-developed and well-nourished.  HENT:  Head: Normocephalic and atraumatic.  Right Ear: Hearing, tympanic membrane, external ear and ear canal normal.  Left Ear:  Hearing, tympanic membrane, external ear and ear canal normal.  Nose: Nose normal.  Mouth/Throat: Oropharynx is clear and moist. No oropharyngeal exudate.  Eyes: Conjunctivae and EOM are normal. Pupils are equal, round, and reactive to light.  Neck: Normal range of motion. Neck supple.  Cardiovascular: Normal rate, regular rhythm and normal heart sounds.  Exam reveals no gallop and no friction rub.   No murmur heard. Pulmonary/Chest: Effort normal and breath sounds normal. No respiratory distress. She has no wheezes. She has no rales.  Abdominal: Soft. Bowel sounds are normal.  Lymphadenopathy:    She has no cervical adenopathy.   Dix-Hallpike maneuver is negative       Assessment & Plan:  Hyperacusia, unspecified laterality - Plan: Ambulatory referral to ENT  Vertigo - Plan: Ambulatory  referral to ENT  Symptoms are somewhat contradictory.  At times she complains of decreased hearing, muffled voices. She is also having tinnitus.  At other times she reports hyperacusis and extreme sensitivities to sounds.  I believe the patient may have had some type of viral neuritis/labyrinthitis which caused vertigo and also mild hearing loss. She may even have an element of eustachian tube dysfunction which is causing mild hearing loss and also some tinnitus. Have recommended a referral to ENT for further evaluation.

## 2014-03-27 ENCOUNTER — Encounter (HOSPITAL_COMMUNITY): Payer: Self-pay | Admitting: Emergency Medicine

## 2014-03-27 ENCOUNTER — Emergency Department (HOSPITAL_COMMUNITY)
Admission: EM | Admit: 2014-03-27 | Discharge: 2014-03-27 | Disposition: A | Discharge status: Home / Self Care | Payer: 59 | Attending: Emergency Medicine | Admitting: Emergency Medicine

## 2014-03-27 DIAGNOSIS — R112 Nausea with vomiting, unspecified: Secondary | ICD-10-CM | POA: Insufficient documentation

## 2014-03-27 DIAGNOSIS — Z862 Personal history of diseases of the blood and blood-forming organs and certain disorders involving the immune mechanism: Secondary | ICD-10-CM | POA: Diagnosis not present

## 2014-03-27 DIAGNOSIS — Z880 Allergy status to penicillin: Secondary | ICD-10-CM | POA: Diagnosis not present

## 2014-03-27 DIAGNOSIS — E119 Type 2 diabetes mellitus without complications: Secondary | ICD-10-CM | POA: Insufficient documentation

## 2014-03-27 DIAGNOSIS — H9190 Unspecified hearing loss, unspecified ear: Secondary | ICD-10-CM | POA: Insufficient documentation

## 2014-03-27 DIAGNOSIS — J45909 Unspecified asthma, uncomplicated: Secondary | ICD-10-CM | POA: Insufficient documentation

## 2014-03-27 DIAGNOSIS — R42 Dizziness and giddiness: Secondary | ICD-10-CM | POA: Insufficient documentation

## 2014-03-27 DIAGNOSIS — Z8719 Personal history of other diseases of the digestive system: Secondary | ICD-10-CM | POA: Diagnosis not present

## 2014-03-27 DIAGNOSIS — Z8639 Personal history of other endocrine, nutritional and metabolic disease: Secondary | ICD-10-CM | POA: Insufficient documentation

## 2014-03-27 DIAGNOSIS — Z79899 Other long term (current) drug therapy: Secondary | ICD-10-CM | POA: Diagnosis not present

## 2014-03-27 DIAGNOSIS — IMO0002 Reserved for concepts with insufficient information to code with codable children: Secondary | ICD-10-CM | POA: Insufficient documentation

## 2014-03-27 LAB — CBC WITH DIFFERENTIAL/PLATELET
Basophils Absolute: 0 10*3/uL (ref 0.0–0.1)
Basophils Relative: 0 % (ref 0–1)
EOS PCT: 0 % (ref 0–5)
Eosinophils Absolute: 0 10*3/uL (ref 0.0–0.7)
HEMATOCRIT: 44.4 % (ref 36.0–46.0)
Hemoglobin: 15.3 g/dL — ABNORMAL HIGH (ref 12.0–15.0)
LYMPHS ABS: 1.1 10*3/uL (ref 0.7–4.0)
LYMPHS PCT: 9 % — AB (ref 12–46)
MCH: 30.6 pg (ref 26.0–34.0)
MCHC: 34.5 g/dL (ref 30.0–36.0)
MCV: 88.8 fL (ref 78.0–100.0)
MONO ABS: 0.5 10*3/uL (ref 0.1–1.0)
Monocytes Relative: 4 % (ref 3–12)
Neutro Abs: 11 10*3/uL — ABNORMAL HIGH (ref 1.7–7.7)
Neutrophils Relative %: 87 % — ABNORMAL HIGH (ref 43–77)
Platelets: 254 10*3/uL (ref 150–400)
RBC: 5 MIL/uL (ref 3.87–5.11)
RDW: 12.7 % (ref 11.5–15.5)
WBC: 12.7 10*3/uL — AB (ref 4.0–10.5)

## 2014-03-27 LAB — COMPREHENSIVE METABOLIC PANEL
ALT: 19 U/L (ref 0–35)
AST: 32 U/L (ref 0–37)
Albumin: 4 g/dL (ref 3.5–5.2)
Alkaline Phosphatase: 68 U/L (ref 39–117)
Anion gap: 16 — ABNORMAL HIGH (ref 5–15)
BUN: 10 mg/dL (ref 6–23)
CALCIUM: 9.2 mg/dL (ref 8.4–10.5)
CHLORIDE: 104 meq/L (ref 96–112)
CO2: 23 mEq/L (ref 19–32)
CREATININE: 0.51 mg/dL (ref 0.50–1.10)
GLUCOSE: 139 mg/dL — AB (ref 70–99)
Potassium: 4.9 mEq/L (ref 3.7–5.3)
SODIUM: 143 meq/L (ref 137–147)
Total Bilirubin: 0.3 mg/dL (ref 0.3–1.2)
Total Protein: 7.2 g/dL (ref 6.0–8.3)

## 2014-03-27 MED ORDER — ONDANSETRON 8 MG PO TBDP: 8.0000 mg | ORAL_TABLET | Freq: Three times a day (TID) | ORAL | Status: DC | PRN

## 2014-03-27 MED ORDER — MECLIZINE HCL 32 MG PO TABS: 32.0000 mg | ORAL_TABLET | Freq: Three times a day (TID) | ORAL | Status: DC | PRN

## 2014-03-27 MED ORDER — MECLIZINE HCL 25 MG PO TABS
25.0000 mg | ORAL_TABLET | Freq: Once | ORAL | Status: AC
  Administered 2014-03-27: 25 mg via ORAL
  Filled 2014-03-27: qty 1

## 2014-03-27 MED ORDER — SODIUM CHLORIDE 0.9 % IV BOLUS (SEPSIS)
1000.0000 mL | Freq: Once | INTRAVENOUS | Status: AC
  Administered 2014-03-27: 1000 mL via INTRAVENOUS

## 2014-03-27 MED ORDER — GI COCKTAIL ~~LOC~~
30.0000 mL | Freq: Once | ORAL | Status: AC
  Administered 2014-03-27: 30 mL via ORAL
  Filled 2014-03-27: qty 30

## 2014-03-27 MED ORDER — ONDANSETRON HCL 4 MG/2ML IJ SOLN
4.0000 mg | Freq: Once | INTRAMUSCULAR | Status: AC
  Administered 2014-03-27: 4 mg via INTRAVENOUS
  Filled 2014-03-27: qty 2

## 2014-03-27 NOTE — Discharge Instructions (Signed)
Benign Positional Vertigo Vertigo means you feel like you or your surroundings are moving when they are not. Benign positional vertigo is the most common form of vertigo. Benign means that the cause of your condition is not serious. Benign positional vertigo is more common in older adults. CAUSES  Benign positional vertigo is the result of an upset in the labyrinth system. This is an area in the middle ear that helps control your balance. This may be caused by a viral infection, head injury, or repetitive motion. However, often no specific cause is found. SYMPTOMS  Symptoms of benign positional vertigo occur when you move your head or eyes in different directions. Some of the symptoms may include:  Loss of balance and falls.  Vomiting.  Blurred vision.  Dizziness.  Nausea.  Involuntary eye movements (nystagmus). DIAGNOSIS  Benign positional vertigo is usually diagnosed by physical exam. If the specific cause of your benign positional vertigo is unknown, your caregiver may perform imaging tests, such as magnetic resonance imaging (MRI) or computed tomography (CT). TREATMENT  Your caregiver may recommend movements or procedures to correct the benign positional vertigo. Medicines such as meclizine, benzodiazepines, and medicines for nausea may be used to treat your symptoms. In rare cases, if your symptoms are caused by certain conditions that affect the inner ear, you may need surgery. HOME CARE INSTRUCTIONS   Follow your caregiver's instructions.  Move slowly. Do not make sudden body or head movements.  Avoid driving.  Avoid operating heavy machinery.  Avoid performing any tasks that would be dangerous to you or others during a vertigo episode.  Drink enough fluids to keep your urine clear or pale yellow. SEEK IMMEDIATE MEDICAL CARE IF:   You develop problems with walking, weakness, numbness, or using your arms, hands, or legs.  You have difficulty speaking.  You develop  severe headaches.  Your nausea or vomiting continues or gets worse.  You develop visual changes.  Your family or friends notice any behavioral changes.  Your condition gets worse.  You have a fever.  You develop a stiff neck or sensitivity to light. MAKE SURE YOU:   Understand these instructions.  Will watch your condition.  Will get help right away if you are not doing well or get worse. Document Released: 04/25/2006 Document Revised: 10/10/2011 Document Reviewed: 04/07/2011 [employer] Patient Information 2015 [employer]. This information is not intended to replace advice given to you by your health care provider. Make sure you discuss any questions you have with your health care provider.    

## 2014-03-27 NOTE — ED Provider Notes (Signed)
CSN: [id number]     Arrival date & time 03/27/14  0536 History   First MD Initiated Contact with Patient 03/27/14 [phone]     Chief Complaint  Patient presents with  . Dizziness  . Emesis     (Consider location/radiation/quality/duration/timing/severity/associated sxs/prior Treatment) Patient is a 38 y.o. female presenting with dizziness and vomiting. The history is provided by the patient and the spouse.  Dizziness Quality:  Room spinning Severity:  Severe Timing:  Constant Chronicity:  Recurrent Associated symptoms: hearing loss, nausea and vomiting   Associated symptoms: no headaches   Associated symptoms comment:  She woke last night covered in sweat with the room spinning. She was able to crawl to the bathroom where she states she had profuse vomiting. She reports one episode vertigo in the past 3 months with intermittent hearing impairment described as sometimes muffled, sometimes hyperacute. No fever, visual changes.  Emesis Associated symptoms: no abdominal pain, no chills, no headaches and no sore throat     Past Medical History  Diagnosis Date  . Asthma   . Diabetes mellitus   . Hyperlipidemia   . Chills   . Abdominal pain   . Constipation   . Nausea   . Constipation    Past Surgical History  Procedure Laterality Date  . Endometrial ablation  01/2010  . Cesarean section  08/1999, 05/2003  . Cystectomy  1997    left wrist   . Wisdom tooth extraction  1996/1997  . Cholecystectomy     Family History  Problem Relation Age of Onset  . High Cholesterol Mother   . Hypertension Mother   . Heart disease Mother   . Diabetes Father   . High Cholesterol Father   . Diabetes Maternal Grandmother   . Heart disease Maternal Grandmother   . Hypertension Maternal Grandmother   . High Cholesterol Maternal Grandmother   . Heart disease Maternal Grandfather   . High Cholesterol Maternal Grandfather   . Cancer Maternal Grandfather   . Diabetes Paternal Grandmother   . Cancer  Paternal Grandfather     prostate  . Lupus Sister    History  Substance Use Topics  . Smoking status: Never Smoker   . Smokeless tobacco: Never Used  . Alcohol Use: No   OB History   Grav Para Term Preterm Abortions TAB SAB Ect Mult Living                 Review of Systems  Constitutional: Negative for fever and chills.  HENT: Positive for hearing loss. Negative for congestion and sore throat.   Respiratory: Negative.   Cardiovascular: Negative.   Gastrointestinal: Positive for nausea and vomiting. Negative for abdominal pain.  Musculoskeletal: Negative.   Skin: Negative.   Neurological: Positive for dizziness. Negative for weakness and headaches.      Allergies  Amoxicillin-pot clavulanate; Ciprofloxacin hcl; Codeine; Erythromycin; and Sulfur  Home Medications   Prior to Admission medications   Medication Sig Start Date End Date Taking? Authorizing Provider  fluticasone (VERAMYST) 27.5 MCG/SPRAY nasal spray Place 2 sprays into the nose daily.   Yes Historical Provider, MD  levonorgestrel-ethinyl estradiol (SEASONALE,INTROVALE,JOLESSA) 0.15-0.03 MG tablet Take 1 tablet by mouth daily.   Yes Historical Provider, MD  naproxen sodium (ANAPROX) 220 MG tablet Take 220 mg by mouth 2 (two) times daily as needed (for pain).   Yes Historical Provider, MD   BP 112/63  Pulse 90  Temp(Src) 98 F (36.7 C) (Oral)  Resp 18  Ht 5'  4" (1.626 m)  Wt 178 lb (80.74 kg)  BMI 30.54 kg/m2  SpO2 100% Physical Exam  Constitutional: She is oriented to person, place, and time. She appears well-developed and well-nourished.  HENT:  Head: Normocephalic.  Eyes: Conjunctivae are normal. Pupils are equal, round, and reactive to light.  Neck: Normal range of motion. Neck supple.  Cardiovascular: Normal rate and regular rhythm.   Pulmonary/Chest: Effort normal and breath sounds normal.  Abdominal: Soft. Bowel sounds are normal. There is no tenderness. There is no rebound and no guarding.   Musculoskeletal: Normal range of motion.  Neurological: She is alert and oriented to person, place, and time. She has normal strength and normal reflexes. No sensory deficit. She displays a negative Romberg sign. Coordination normal.  Positive left lateral nystagmus. CN's 3-12 grossly intact. Ambulation not attempted. No lateralizing weakness, speech impairment or coordination deficits.   Skin: Skin is warm and dry. No rash noted.  Psychiatric: She has a normal mood and affect.    ED Course  Procedures (including critical care time) Labs Review Labs Reviewed  CBC WITH DIFFERENTIAL - Abnormal; Notable for the following:    WBC 12.7 (*)    Hemoglobin 15.3 (*)    Neutrophils Relative % 87 (*)    Neutro Abs 11.0 (*)    Lymphocytes Relative 9 (*)    All other components within normal limits  COMPREHENSIVE METABOLIC PANEL - Abnormal; Notable for the following:    Glucose, Bld 139 (*)    Anion gap 16 (*)    All other components within normal limits   Results for orders placed during the hospital encounter of 03/27/14  CBC WITH DIFFERENTIAL      Result Value Ref Range   WBC 12.7 (*) 4.0 - 10.5 K/uL   RBC 5.00  3.87 - 5.11 MIL/uL   Hemoglobin 15.3 (*) 12.0 - 15.0 g/dL   HCT 40.9  81.1 - 91.4 %   MCV 88.8  78.0 - 100.0 fL   MCH 30.6  26.0 - 34.0 pg   MCHC 34.5  30.0 - 36.0 g/dL   RDW 78.2  95.6 - 21.3 %   Platelets 254  150 - 400 K/uL   Neutrophils Relative % 87 (*) 43 - 77 %   Neutro Abs 11.0 (*) 1.7 - 7.7 K/uL   Lymphocytes Relative 9 (*) 12 - 46 %   Lymphs Abs 1.1  0.7 - 4.0 K/uL   Monocytes Relative 4  3 - 12 %   Monocytes Absolute 0.5  0.1 - 1.0 K/uL   Eosinophils Relative 0  0 - 5 %   Eosinophils Absolute 0.0  0.0 - 0.7 K/uL   Basophils Relative 0  0 - 1 %   Basophils Absolute 0.0  0.0 - 0.1 K/uL  COMPREHENSIVE METABOLIC PANEL      Result Value Ref Range   Sodium 143  137 - 147 mEq/L   Potassium 4.9  3.7 - 5.3 mEq/L   Chloride 104  96 - 112 mEq/L   CO2 23  19 - 32  mEq/L   Glucose, Bld 139 (*) 70 - 99 mg/dL   BUN 10  6 - 23 mg/dL   Creatinine, Ser 0.86  0.50 - 1.10 mg/dL   Calcium 9.2  8.4 - 57.8 mg/dL   Total Protein 7.2  6.0 - 8.3 g/dL   Albumin 4.0  3.5 - 5.2 g/dL   AST 32  0 - 37 U/L   ALT 19  0 -  35 U/L   Alkaline Phosphatase 68  39 - 117 U/L   Total Bilirubin 0.3  0.3 - 1.2 mg/dL   GFR calc non Af Amer >90  >90 mL/min   GFR calc Af Amer >90  >90 mL/min   Anion gap 16 (*) 5 - 15    Imaging Review No results found.   EKG Interpretation None      MDM   Final diagnoses:  None    1. Vertigo  Patient feeling better with Meclizine and fluids. Up to the bathroom without difficulty, mild residual dizziness, no further vomiting, tolerating PO fluids. Stable for discharge home with PCP follow up. Return precautions provided.    Arnoldo Hooker, PA-C 03/27/14 [phone]

## 2014-03-27 NOTE — ED Notes (Signed)
Pt brought to the ED by [facility] from home c/o dizziness, nausea and vomiting, diaphoretic and chills, pt states she didn't check her temp. 5 episodes of vomiting at home 2 on the way to the hospital. EKG for EMS SR, VSS, CBG 146. Pt denies diarrhea, denies taking any medication at home.

## 2014-03-28 NOTE — ED Provider Notes (Signed)
Medical screening examination/treatment/procedure(s) were performed by non-physician practitioner and as supervising physician I was immediately available for consultation/collaboration.    Vida Roller, MD 03/28/14 [phone]

## 2014-04-01 ENCOUNTER — Telehealth: Payer: Self-pay | Admitting: *Deleted

## 2014-04-01 HISTORY — PX: MINOR HEMORRHOIDECTOMY: SHX6238

## 2014-04-01 NOTE — Telephone Encounter (Signed)
Pt called wanting to know if you could look over the lab results from when she was in hospital to see if there is anything different from the lab results when she came in for office visit, and let her know on way or the other.

## 2014-04-03 NOTE — Telephone Encounter (Signed)
Labs were relativel nml except slightly elevated WBC 12.7 which may be nothing or could signify some type of infection.  NTBS if worsening.

## 2014-04-04 NOTE — Telephone Encounter (Signed)
Pt aware.

## 2014-04-08 ENCOUNTER — Ambulatory Visit (INDEPENDENT_AMBULATORY_CARE_PROVIDER_SITE_OTHER): Payer: 59 | Admitting: Family Medicine

## 2014-04-08 VITALS — BP 120/72 | HR 77 | Temp 98.5°F | Resp 18 | Ht 65.0 in | Wt 174.0 lb

## 2014-04-08 DIAGNOSIS — K645 Perianal venous thrombosis: Secondary | ICD-10-CM

## 2014-04-08 DIAGNOSIS — L290 Pruritus ani: Secondary | ICD-10-CM

## 2014-04-08 DIAGNOSIS — K6289 Other specified diseases of anus and rectum: Secondary | ICD-10-CM

## 2014-04-08 MED ORDER — DILTIAZEM GEL 2 %: 1.0000 "application " | Freq: Two times a day (BID) | CUTANEOUS | Status: DC

## 2014-04-08 MED ORDER — LIDOCAINE-PRILOCAINE 2.5-2.5 % EX CREA: 1.0000 "application " | TOPICAL_CREAM | Freq: Two times a day (BID) | CUTANEOUS | Status: DC | PRN

## 2014-04-08 MED ORDER — HYDROCORTISONE ACETATE 25 MG RE SUPP: 25.0000 mg | Freq: Two times a day (BID) | RECTAL | Status: DC

## 2014-04-08 NOTE — Patient Instructions (Signed)

## 2014-04-08 NOTE — Progress Notes (Signed)
Chief Complaint:  Chief Complaint  Patient presents with  . Hemorrhoids    since saturday     HPI: Candice Jones is a 38 y.o. female who is here for 4 days of hemorrhoid pain, she has had this before. She had it when she was pregnant , last night has had bleeding on her underwear. She denies CP, fevers chills, dizziness. She has pain. has tried prep H and that usually works for her and it usually goes away in 24 hours. But this time it has gotten worse and not better. Taking motrin.  10/10 pain, has had some blood again only on her underwear. She thinks she has been doing a lot of straining and increasing abd pressure with lifting her daughter's suticases to get her ready to go to [place] on a mission trip.   PMH: hx of IBS  Past Medical History  Diagnosis Date  . Asthma   . Hyperlipidemia   . Chills   . Abdominal pain   . Constipation   . Nausea   . Constipation   . Diabetes mellitus     Gestational DM   Past Surgical History  Procedure Laterality Date  . Endometrial ablation  01/2010  . Cesarean section  08/1999, 05/2003  . Cystectomy  1997    left wrist   . Wisdom tooth extraction  1996/1997  . Cholecystectomy     History   Social History  . Marital Status: Married    Spouse Name: N/A    Number of Children: N/A  . Years of Education: N/A   Social History Main Topics  . Smoking status: Never Smoker   . Smokeless tobacco: Never Used  . Alcohol Use: No  . Drug Use: No  . Sexual Activity: Yes   Other Topics Concern  . None   Social History Narrative  . None   Family History  Problem Relation Age of Onset  . High Cholesterol Mother   . Hypertension Mother   . Heart disease Mother   . Diabetes Father   . High Cholesterol Father   . Diabetes Maternal Grandmother   . Heart disease Maternal Grandmother   . Hypertension Maternal Grandmother   . High Cholesterol Maternal Grandmother   . Heart disease Maternal Grandfather   . High Cholesterol Maternal  Grandfather   . Cancer Maternal Grandfather   . Diabetes Paternal Grandmother   . Cancer Paternal Grandfather     prostate  . Lupus Sister    Allergies  Allergen Reactions  . Amoxicillin-Pot Clavulanate     Vomiting   . Ciprofloxacin Hcl     Arm turned red  . Codeine Other (See Comments)    Makes pain worse  . Erythromycin     Rash   . Sulfur     rash   Prior to Admission medications   Medication Sig Start Date End Date Taking? Authorizing Provider  fluticasone (VERAMYST) 27.5 MCG/SPRAY nasal spray Place 2 sprays into the nose daily.   Yes Historical Provider, MD  levonorgestrel-ethinyl estradiol (SEASONALE,INTROVALE,JOLESSA) 0.15-0.03 MG tablet Take 1 tablet by mouth daily.   Yes Historical Provider, MD  meclizine (ANTIVERT) 32 MG tablet Take 1 tablet (32 mg total) by mouth 3 (three) times daily as needed. 03/27/14   Shari A Upstill, PA-C  naproxen sodium (ANAPROX) 220 MG tablet Take 220 mg by mouth 2 (two) times daily as needed (for pain).    Historical Provider, MD  ondansetron (ZOFRAN ODT) 8  MG disintegrating tablet Take 1 tablet (8 mg total) by mouth every 8 (eight) hours as needed for nausea or vomiting. 03/27/14   Shari A Upstill, PA-C     ROS: The patient denies fevers, chills, night sweats, unintentional weight loss, chest pain, palpitations, wheezing, dyspnea on exertion, , vomiting, abdominal pain, dysuria, hematuria, melena, numbness, weakness, or tingling.  All other systems have been reviewed and were otherwise negative with the exception of those mentioned in the HPI and as above.    PHYSICAL EXAM: Filed Vitals:   04/08/14 2016  BP: 120/72  Pulse: 77  Temp: 98.5 F (36.9 C)  Resp: 18   Filed Vitals:   04/08/14 2016  Height:  (1.651 m)  Weight: 174 lb (78.926 kg)   Body mass index is 28.96 kg/(m^2).  General: Alert, no acute distress HEENT:  Normocephalic, atraumatic, oropharynx patent. EOMI, PERRLA Cardiovascular:  Regular rate and rhythm, no  rubs murmurs or gallops.  No Carotid bruits, radial pulse intact. No pedal edema.  Respiratory: Clear to auscultation bilaterally.  No wheezes, rales, or rhonchi.  No cyanosis, no use of accessory musculature GI: No organomegaly, abdomen is soft and non-tender, positive bowel sounds.  No masses. Skin: No rashes. Neurologic: Facial musculature symmetric. Psychiatric: Patient is appropriate throughout our interaction. Lymphatic: No cervical lymphadenopathy Musculoskeletal: Gait intact. + thrombosed hemorrhoid, external  No visible fissures   LABS:    EKG/XRAY:   Primary read interpreted by Dr. Conley Rolls at [facility].   ASSESSMENT/PLAN: Encounter Diagnoses  Name Primary?  . Hemorrhoid thrombosis Yes  . Rectal itching   . Rectal pain    Wound care as directed Hemorrhoid care as directed Rx Anusol, Diltiazem gel, Emla cream prn F/u prn  Gross sideeffects, risk and benefits, and alternatives of medications d/w patient. Patient is aware that all medications have potential sideeffects and we are unable to predict every sideeffect or drug-drug interaction that may occur.  Hamilton Capri PHUONG, DO 04/08/2014 9:03 PM

## 2014-04-08 NOTE — Progress Notes (Signed)
Procedure:  Consent obtained. Local anesthesia with 1% lido with epi.  Elliptical incision used to evacuated the thrombosis.  Wound care d/w pt.

## 2014-04-10 ENCOUNTER — Other Ambulatory Visit: Payer: Self-pay | Admitting: *Deleted

## 2014-04-10 DIAGNOSIS — K645 Perianal venous thrombosis: Secondary | ICD-10-CM

## 2014-04-10 DIAGNOSIS — K6289 Other specified diseases of anus and rectum: Secondary | ICD-10-CM

## 2014-04-10 DIAGNOSIS — L290 Pruritus ani: Secondary | ICD-10-CM

## 2014-04-10 MED ORDER — DILTIAZEM GEL 2 %: 1.0000 "application " | Freq: Two times a day (BID) | CUTANEOUS | Status: DC

## 2014-04-10 NOTE — Telephone Encounter (Signed)
Received call from pharmacy- diltazam gel needs to be sent to a compounding pharmacy- [facility] Pt advised sent to different pharmacy.

## 2014-04-18 ENCOUNTER — Telehealth: Payer: Self-pay | Admitting: Internal Medicine

## 2014-04-22 ENCOUNTER — Encounter: Payer: Self-pay | Admitting: Internal Medicine

## 2014-04-29 NOTE — Telephone Encounter (Signed)
Appt Scheduled 06-24-14/yf

## 2014-05-16 ENCOUNTER — Other Ambulatory Visit: Payer: Self-pay

## 2014-05-28 ENCOUNTER — Ambulatory Visit (INDEPENDENT_AMBULATORY_CARE_PROVIDER_SITE_OTHER): Payer: 59 | Admitting: Family Medicine

## 2014-05-28 ENCOUNTER — Other Ambulatory Visit (INDEPENDENT_AMBULATORY_CARE_PROVIDER_SITE_OTHER): Payer: Self-pay | Admitting: Otolaryngology

## 2014-05-28 ENCOUNTER — Other Ambulatory Visit: Payer: 59

## 2014-05-28 DIAGNOSIS — Z23 Encounter for immunization: Secondary | ICD-10-CM

## 2014-05-28 LAB — POTASSIUM: Potassium: 4.1 mEq/L (ref 3.5–5.3)

## 2014-06-24 ENCOUNTER — Ambulatory Visit (INDEPENDENT_AMBULATORY_CARE_PROVIDER_SITE_OTHER): Payer: 59 | Admitting: Internal Medicine

## 2014-06-24 ENCOUNTER — Encounter: Payer: Self-pay | Admitting: Internal Medicine

## 2014-06-24 VITALS — BP 110/70 | HR 86 | Ht 65.0 in | Wt 175.0 lb

## 2014-06-24 DIAGNOSIS — K648 Other hemorrhoids: Secondary | ICD-10-CM

## 2014-06-24 DIAGNOSIS — K589 Irritable bowel syndrome without diarrhea: Secondary | ICD-10-CM

## 2014-06-24 MED ORDER — ALIGN 4 MG PO CAPS: 1.0000 | ORAL_CAPSULE | Freq: Every day | ORAL | Status: DC

## 2014-06-24 MED ORDER — RIFAXIMIN 550 MG PO TABS: 550.0000 mg | ORAL_TABLET | Freq: Three times a day (TID) | ORAL | Status: AC

## 2014-06-24 MED ORDER — LOPERAMIDE HCL 2 MG PO CAPS: 2.0000 mg | ORAL_CAPSULE | ORAL | Status: DC | PRN

## 2014-06-24 NOTE — Progress Notes (Signed)
Referred by Dr. Richardean ChimeraJohn McComb Subjective:    Patient ID: Candice RistAmy M Jones, female    DOB: 11/06/1975, 38 y.o.   MRN: [id number]  HPI Very nice married woman with a 20 yr hx of rectal bleeding and hemorrhoid symptoms of prolapse and rare fecal soiling. Started after first pregnancy. May go months w/o sxs but chronic and intermittent. Had a thrombosed hemorrhoid in September after lifting suitcases and had excision. Would like to avoid a recurrence of that.  Also has IBS with chronic episodic and unpredictable urgent defecation of loose stools. Normal defecation in between. No clear triggers and can eat same thing ,more than once with or without sxs. Loperamide does seem to help and prevent. Hyoscyamine has not.  Allergies  Allergen Reactions  . Augmentin [Amoxicillin-Pot Clavulanate] Nausea And Vomiting    Vomiting   . Ciprofloxacin Hcl     Arm turned red  . Codeine Other (See Comments)    Makes pain worse  . Erythromycin     Rash   . Sulfur     rash   Outpatient Prescriptions Prior to Visit  Medication Sig Dispense Refill  . fluticasone (VERAMYST) 27.5 MCG/SPRAY nasal spray Place 2 sprays into the nose daily.    . hydrocortisone (ANUSOL-HC) 25 MG suppository Place 1 suppository (25 mg total) rectally 2 (two) times daily. 12 suppository 0  . levonorgestrel-ethinyl estradiol (SEASONALE,INTROVALE,JOLESSA) 0.15-0.03 MG tablet Take 1 tablet by mouth daily.    Marland Kitchen. diltiazem 2 % GEL Apply 1 application topically 2 (two) times daily. prn 30 g 0  . lidocaine-prilocaine (EMLA) cream Apply 1 application topically 2 (two) times daily as needed. 30 g 0   No facility-administered medications prior to visit.   Past Medical History  Diagnosis Date  . Asthma   . Hyperlipidemia   . Gestational diabetes mellitus   . IBS (irritable bowel syndrome)   . Gall stones   . Kidney stones   . Vertigo    Past Surgical History  Procedure Laterality Date  . Endometrial ablation  01/2010  . Cesarean section   08/1999, 05/2003  . Cystectomy  1997    left wrist   . Wisdom tooth extraction  1996/1997  . Cholecystectomy  2011  . Minor hemorrhoidectomy  04/2014    thrombosed   History   Social History  . Marital Status: Married    Spouse Name: N/A    Number of Children: N/A  . Years of Education: N/A   Occupational History  .  [employer]   Social History Main Topics  . Smoking status: Never Smoker   . Smokeless tobacco: Never Used  . Alcohol Use: No  . Drug Use: No  . Sexual Activity: Yes   Other Topics Concern  . None   Social History Narrative   Married, 2 sons and 1 daughter   [employer]   No caffeine   Family History  Problem Relation Age of Onset  . High Cholesterol Mother   . Hypertension Mother   . Heart disease Mother   . Diabetes Father   . High Cholesterol Father   . Diabetes Maternal Grandmother   . Heart disease Maternal Grandmother   . Hypertension Maternal Grandmother   . High Cholesterol Maternal Grandmother   . Heart disease Maternal Grandfather   . High Cholesterol Maternal Grandfather   . Cancer Maternal Grandfather   . Diabetes Paternal Grandmother   . Prostate cancer Paternal Grandfather   .  Lupus Sister         Review of Systems     Objective:   Physical Exam  General:  Well-developed, well-nourished and in no acute distress Eyes:  anicteric.  Lungs: Clear to auscultation bilaterally. Heart:  S1S2, no rubs, murmurs, gallops. Abdomen:  soft, non-tender, no hepatosplenomegaly, hernia, or mass and BS+.  Rectal:  Female staff present  2 small anal tags, o/w NL anoderm Nontender DRE with medium anal canal length, no mass   Anoscopy  Grade 2 internal hemorrhoids all positions RP most prominent   Lymph:  no cervical or supraclavicular adenopathy. Extremities:   no edema Skin   no rash. Neuro:  A&O x 3.  Psych:  appropriate mood and  Affect.    PROCEDURE NOTE: The  patient presents with symptomatic grade 2 hemorrhoids, requesting rubber band ligation of his/her hemorrhoidal disease.  All risks, benefits and alternative forms of therapy were described and informed consent was obtained.  The decision was made to band the RP  internal hemorrhoid, and the [facility] System was used to perform band ligation without complication.  Digital anorectal examination was then performed to assure proper positioning of the band, and to adjust the banded tissue as required.  The patient was discharged home without pain or other issues.  Dietary and behavioral recommendations were given and along with follow-up instructions.     The following adjunctive treatments were recommended:  Tx IBS as per IBS A/P  The patient will return in 2-4 weeks for  follow-up and additional banding as required. No complications were encountered and the patient tolerated the procedure well.    Data Reviewed: [facility] notes    Assessment & Plan:  Internal hemorrhoids with bleeding and prolapse Grade 2 all 3 positions. RP most prominent  RP banded today RTC 2-4 weeks to band 1-2 more  IBS (irritable bowel syndrome) Try daily probiotic Consider Xifaxan 550 mg tid x 2weeks - rx provided and rationale discussed OK to use loperamide as prophylaxis Does not seem symptomatic enough for bile acid binder (sxs worse since cholecyetectomy)   Cc: Richardean ChimeraJohn McComb, MD

## 2014-06-24 NOTE — Patient Instructions (Addendum)
HEMORRHOID BANDING PROCEDURE    FOLLOW-UP CARE   1. The procedure you have had should have been relatively painless since the banding of the area involved does not have nerve endings and there is no pain sensation.  The rubber band cuts off the blood supply to the hemorrhoid and the band may fall off as soon as 48 hours after the banding (the band may occasionally be seen in the toilet bowl following a bowel movement). You may notice a temporary feeling of fullness in the rectum which should respond adequately to plain Tylenol or Motrin.  2. Following the banding, avoid strenuous exercise that evening and resume full activity the next day.  A sitz bath (soaking in a warm tub) or bidet is soothing, and can be useful for cleansing the area after bowel movements.     3. To avoid constipation, take two tablespoons of natural wheat bran, natural oat bran, flax, Benefiber or any over the counter fiber supplement and increase your water intake to 7-8 glasses daily.    4. Unless you have been prescribed anorectal medication, do not put anything inside your rectum for two weeks: No suppositories, enemas, fingers, etc.  5. Occasionally, you may have more bleeding than usual after the banding procedure.  This is often from the untreated hemorrhoids rather than the treated one.  Don't be concerned if there is a tablespoon or so of blood.  If there is more blood than this, lie flat with your bottom higher than your head and apply an ice pack to the area. If the bleeding does not stop within a half an hour or if you feel faint, call our office at [phone] or go to the emergency room.  6. Problems are not common; however, if there is a substantial amount of bleeding, severe pain, chills, fever or difficulty passing urine (very rare) or other problems, you should call us at [phone] or report to the nearest emergency room.  7. Do not stay seated continuously for more than 2-3 hours for a day or two  after the procedure.  Tighten your buttock muscles 10-15 times every two hours and take 10-15 deep breaths every 1-2 hours.  Do not spend more than a few minutes on the toilet if you cannot empty your bowel; instead re-visit the toilet at a later time.    Please take align for a month, coupon provided.  We are giving you a rx for xifaxan to take to your pharmacy.  Use Imodium as needed.   We will see you at your next appointment, December 9th at 2:15pm.   I appreciate the opportunity to care for you.

## 2014-06-24 NOTE — Assessment & Plan Note (Signed)
Try daily probiotic Consider Xifaxan 550 mg tid x 2weeks - rx provided and rationale discussed OK to use loperamide as prophylaxis Does not seem symptomatic enough for bile acid binder (sxs worse since cholecyetectomy)

## 2014-06-24 NOTE — Assessment & Plan Note (Signed)
Grade 2 all 3 positions. RP most prominent  RP banded today RTC 2-4 weeks to band 1-2 more

## 2014-07-09 ENCOUNTER — Encounter: Payer: Self-pay | Admitting: Internal Medicine

## 2014-07-09 ENCOUNTER — Ambulatory Visit (INDEPENDENT_AMBULATORY_CARE_PROVIDER_SITE_OTHER): Payer: PRIVATE HEALTH INSURANCE | Admitting: Internal Medicine

## 2014-07-09 VITALS — BP 122/72 | HR 88 | Ht 65.0 in | Wt 172.4 lb

## 2014-07-09 DIAGNOSIS — K648 Other hemorrhoids: Secondary | ICD-10-CM

## 2014-07-09 NOTE — Assessment & Plan Note (Signed)
LL and RA banded She will f/u prn

## 2014-07-09 NOTE — Progress Notes (Signed)
Patient ID: Candice Jones, female   DOB: 05/15/1976, 38 y.o.   MRN: [id number]          PROCEDURE NOTE: The patient presents with symptomatic grade 2  hemorrhoids, requesting rubber band ligation of his/her hemorrhoidal disease.  All risks, benefits and alternative forms of therapy were described and informed consent was obtained.   The decision was made to band the RA and LL  internal hemorrhoid, and the [facility] System was used to perform band ligation without complication.  Digital anorectal examination was then performed to assure proper positioning of the band, and to adjust the banded tissue as required.  The patient was discharged home without pain or other issues.  Dietary and behavioral recommendations were given and along with follow-up instructions.     The following adjunctive treatments were recommended: Probiotics to help IBS  The patient will return as needed for  follow-up and possible additional banding as required. No complications were encountered and the patient tolerated the procedure well.  I appreciate the opportunity to care for this patient.  Cc: Richardean ChimeraJohn McComb, MD

## 2014-07-09 NOTE — Patient Instructions (Signed)
HEMORRHOID BANDING PROCEDURE    FOLLOW-UP CARE   1. The procedure you have had should have been relatively painless since the banding of the area involved does not have nerve endings and there is no pain sensation.  The rubber band cuts off the blood supply to the hemorrhoid and the band may fall off as soon as 48 hours after the banding (the band may occasionally be seen in the toilet bowl following a bowel movement). You may notice a temporary feeling of fullness in the rectum which should respond adequately to plain Tylenol or Motrin.  2. Following the banding, avoid strenuous exercise that evening and resume full activity the next day.  A sitz bath (soaking in a warm tub) or bidet is soothing, and can be useful for cleansing the area after bowel movements.     3. To avoid constipation, take two tablespoons of natural wheat bran, natural oat bran, flax, Benefiber or any over the counter fiber supplement and increase your water intake to 7-8 glasses daily.    4. Unless you have been prescribed anorectal medication, do not put anything inside your rectum for two weeks: No suppositories, enemas, fingers, etc.  5. Occasionally, you may have more bleeding than usual after the banding procedure.  This is often from the untreated hemorrhoids rather than the treated one.  Don't be concerned if there is a tablespoon or so of blood.  If there is more blood than this, lie flat with your bottom higher than your head and apply an ice pack to the area. If the bleeding does not stop within a half an hour or if you feel faint, call our office at [phone] or go to the emergency room.  6. Problems are not common; however, if there is a substantial amount of bleeding, severe pain, chills, fever or difficulty passing urine (very rare) or other problems, you should call us at [phone] or report to the nearest emergency room.  7. Do not stay seated continuously for more than 2-3 hours for a day or two  after the procedure.  Tighten your buttock muscles 10-15 times every two hours and take 10-15 deep breaths every 1-2 hours.  Do not spend more than a few minutes on the toilet if you cannot empty your bowel; instead re-visit the toilet at a later time.    Follow up with Dr Gessner as needed.   I appreciate the opportunity to care for you.  

## 2014-08-04 ENCOUNTER — Other Ambulatory Visit: Payer: Self-pay | Admitting: Obstetrics and Gynecology

## 2014-08-07 LAB — CYTOLOGY - PAP

## 2014-12-17 ENCOUNTER — Other Ambulatory Visit: Payer: Commercial Indemnity

## 2014-12-17 ENCOUNTER — Other Ambulatory Visit (INDEPENDENT_AMBULATORY_CARE_PROVIDER_SITE_OTHER): Payer: Self-pay | Admitting: Otolaryngology

## 2014-12-18 LAB — POTASSIUM: Potassium: 3.8 mEq/L (ref 3.5–5.3)

## 2014-12-19 ENCOUNTER — Encounter: Payer: Self-pay | Admitting: Family Medicine

## 2014-12-19 ENCOUNTER — Other Ambulatory Visit: Payer: Self-pay | Admitting: Family Medicine

## 2014-12-19 ENCOUNTER — Ambulatory Visit (INDEPENDENT_AMBULATORY_CARE_PROVIDER_SITE_OTHER): Payer: Commercial Indemnity | Admitting: Family Medicine

## 2014-12-19 VITALS — BP 118/88 | HR 100 | Temp 99.0°F | Resp 16 | Ht 65.0 in | Wt 180.0 lb

## 2014-12-19 DIAGNOSIS — R1010 Upper abdominal pain, unspecified: Secondary | ICD-10-CM

## 2014-12-19 DIAGNOSIS — R1011 Right upper quadrant pain: Principal | ICD-10-CM

## 2014-12-19 DIAGNOSIS — R252 Cramp and spasm: Secondary | ICD-10-CM | POA: Diagnosis not present

## 2014-12-19 DIAGNOSIS — G8929 Other chronic pain: Secondary | ICD-10-CM | POA: Diagnosis not present

## 2014-12-19 LAB — CBC WITH DIFFERENTIAL/PLATELET
BASOS ABS: 0 10*3/uL (ref 0.0–0.1)
BASOS PCT: 0 % (ref 0–1)
EOS ABS: 0 10*3/uL (ref 0.0–0.7)
EOS PCT: 0 % (ref 0–5)
HEMATOCRIT: 44.1 % (ref 36.0–46.0)
Hemoglobin: 15.4 g/dL — ABNORMAL HIGH (ref 12.0–15.0)
Lymphocytes Relative: 27 % (ref 12–46)
Lymphs Abs: 2.2 10*3/uL (ref 0.7–4.0)
MCH: 30.1 pg (ref 26.0–34.0)
MCHC: 34.9 g/dL (ref 30.0–36.0)
MCV: 86.3 fL (ref 78.0–100.0)
MPV: 8.7 fL (ref 8.6–12.4)
Monocytes Absolute: 0.5 10*3/uL (ref 0.1–1.0)
Monocytes Relative: 6 % (ref 3–12)
NEUTROS ABS: 5.4 10*3/uL (ref 1.7–7.7)
Neutrophils Relative %: 67 % (ref 43–77)
Platelets: 380 10*3/uL (ref 150–400)
RBC: 5.11 MIL/uL (ref 3.87–5.11)
RDW: 12.6 % (ref 11.5–15.5)
WBC: 8.1 10*3/uL (ref 4.0–10.5)

## 2014-12-19 NOTE — Progress Notes (Signed)
Subjective:    Patient ID: Candice Jones, female    DOB: 04/03/1976, 39 y.o.   MRN: [id number]  HPI  Patient reports frequent episodes of right upper quadrant abdominal pain. She attributes this to gas pains. Patient has had her gallbladder removed and therefore cannot be gallstones. She does have a diagnosis of IBS although this is been managed with probiotics. She is no longer having diarrhea has constipation. She denies fevers chills melena or hematochezia. She denies weight loss. There is a strong family history of autoimmune diseases including autoimmune hepatitis in her mother and lupus in her sister. Is no family history of Crohn's disease. She denies any heartburn or melena or other signs of an ulcer. She denies any association of the pain with food  She also reports frequent leg cramps that are severe. She is on Maxide for Mnire's disease however her ENT physician recently checked a potassium level that was normal. No one has checked a magnesium level. Past Medical History  Diagnosis Date  . Asthma   . Hyperlipidemia   . Gestational diabetes mellitus   . IBS (irritable bowel syndrome)   . Gall stones   . Kidney stones   . Vertigo    Past Surgical History  Procedure Laterality Date  . Endometrial ablation  01/2010  . Cesarean section  08/1999, 05/2003  . Cystectomy  1997    left wrist   . Wisdom tooth extraction  1996/1997  . Cholecystectomy  2011  . Minor hemorrhoidectomy  04/2014    thrombosed  . Hemorrhoid banding  2015   Current Outpatient Prescriptions on File Prior to Visit  Medication Sig Dispense Refill  . fluticasone (VERAMYST) 27.5 MCG/SPRAY nasal spray Place 2 sprays into the nose daily.    . hydrocortisone (ANUSOL-HC) 25 MG suppository Place 1 suppository (25 mg total) rectally 2 (two) times daily. 12 suppository 0  . levonorgestrel-ethinyl estradiol (SEASONALE,INTROVALE,JOLESSA) 0.15-0.03 MG tablet Take 1 tablet by mouth daily.    . Probiotic Product (ALIGN) 4  MG CAPS Take 1 capsule (4 mg total) by mouth daily.    Marland Kitchen. triamterene-hydrochlorothiazide (DYAZIDE) 37.5-25 MG per capsule Take 1 capsule by mouth daily.  5   No current facility-administered medications on file prior to visit.   Allergies  Allergen Reactions  . Augmentin [Amoxicillin-Pot Clavulanate] Nausea And Vomiting    Vomiting   . Ciprofloxacin Hcl     Arm turned red  . Codeine Other (See Comments)    Makes pain worse  . Erythromycin     Rash   . Sulfur     rash   History   Social History  . Marital Status: Married    Spouse Name: N/A  . Number of Children: N/A  . Years of Education: N/A   Occupational History  .  [employer]   Social History Main Topics  . Smoking status: Never Smoker   . Smokeless tobacco: Never Used  . Alcohol Use: No  . Drug Use: No  . Sexual Activity: Yes   Other Topics Concern  . Not on file   Social History Narrative   Married, 2 sons and 1 daughter   [employer]   No caffeine     Review of Systems  All other systems reviewed and are negative.      Objective:   Physical Exam  Constitutional: She is oriented to person, place, and time. She appears well-developed and well-nourished.  Neck: Neck  supple. No thyromegaly present.  Cardiovascular: Normal rate, regular rhythm and normal heart sounds.   Pulmonary/Chest: Effort normal and breath sounds normal. No respiratory distress. She has no wheezes. She has no rales.  Abdominal: Soft. Bowel sounds are normal. She exhibits no distension and no mass. There is no tenderness. There is no rebound and no guarding.  Lymphadenopathy:    She has no cervical adenopathy.  Neurological: She is alert and oriented to person, place, and time. She has normal reflexes. She displays normal reflexes. No cranial nerve deficit. She exhibits normal muscle tone. Coordination normal.  Vitals reviewed.         Assessment & Plan:  Abdominal  pain, chronic, right upper quadrant - Plan: Fecal occult blood, imunochemical, Fecal occult blood, imunochemical, Fecal occult blood, imunochemical, CBC with Differential/Platelet, COMPLETE METABOLIC PANEL WITH GFR, Magnesium, Sedimentation rate  Cramps of right lower extremity - Plan: Magnesium  Differential diagnosis for the abdominal pain includes an ulcer in the small intestine, irritable bowel syndrome, gas pains, inflammatory bowel disease. I will begin by obtaining a sedimentation rate along with fecal occult blood testing. If there is blood and obviously there could be inflammatory bowel disease or possibly an ulcer. If the sedimentation rate in the fecal occult blood testing is normal then I will try treating the patient empirically with a PPI. If symptoms persist, then I would give the patient the working diagnosis of irritable bowel syndrome and I would consider starting her on Bentyl and/or Gas-X. Cramps could be related to her diuretic. I have recommended that she temporarily discontinue the Maxide. Meanwhile I will check a magnesium level. If magnesium level is low, the patient may benefit from magnesium repletion. If the cramps stop she discontinues the Maxide then we have a cause for her cramps and we can treat him symptomatically if she needs to continue the Maxide. If the magnesium level is normal and the cramps persisted even off the Maxide, then I would try the patient empirically on hydrochloroquine.  Patient can also try tonic water.  I will await the results of her lab work

## 2014-12-20 LAB — COMPLETE METABOLIC PANEL WITH GFR
ALT: 115 U/L — ABNORMAL HIGH (ref 0–35)
AST: 103 U/L — ABNORMAL HIGH (ref 0–37)
Albumin: 4.1 g/dL (ref 3.5–5.2)
Alkaline Phosphatase: 69 U/L (ref 39–117)
BILIRUBIN TOTAL: 0.5 mg/dL (ref 0.2–1.2)
BUN: 8 mg/dL (ref 6–23)
CHLORIDE: 101 meq/L (ref 96–112)
CO2: 24 meq/L (ref 19–32)
Calcium: 9.3 mg/dL (ref 8.4–10.5)
Creat: 0.56 mg/dL (ref 0.50–1.10)
GFR, Est African American: >89 mL/min
GFR, Est Non African American: >89 mL/min
GLUCOSE: 116 mg/dL — AB (ref 70–99)
Potassium: 3.6 mEq/L (ref 3.5–5.3)
SODIUM: 137 meq/L (ref 135–145)
TOTAL PROTEIN: 6.6 g/dL (ref 6.0–8.3)

## 2014-12-20 LAB — MAGNESIUM: Magnesium: 2 mg/dL (ref 1.5–2.5)

## 2014-12-20 LAB — SEDIMENTATION RATE: Sed Rate: 4 mm/hr (ref 0–20)

## 2014-12-22 ENCOUNTER — Encounter: Payer: Self-pay | Admitting: Family Medicine

## 2014-12-22 ENCOUNTER — Other Ambulatory Visit: Payer: Self-pay | Admitting: Family Medicine

## 2014-12-22 DIAGNOSIS — R945 Abnormal results of liver function studies: Secondary | ICD-10-CM

## 2014-12-22 DIAGNOSIS — R1011 Right upper quadrant pain: Secondary | ICD-10-CM

## 2014-12-22 DIAGNOSIS — R7989 Other specified abnormal findings of blood chemistry: Secondary | ICD-10-CM

## 2014-12-22 LAB — HEPATITIS PANEL, ACUTE
HCV AB: NEGATIVE
HEP B C IGM: NONREACTIVE
HEP B S AG: NEGATIVE
Hep A IgM: NONREACTIVE

## 2014-12-22 NOTE — Telephone Encounter (Signed)
Pt calling in now.  Has been off diuretic over the weekend and legs feel better today.  Is aware of recent labs. Ultrasound ordered for Thursday.  Question is should restart Triamterene or tonic water?

## 2014-12-23 LAB — ANA: Anti Nuclear Antibody(ANA): POSITIVE — AB

## 2014-12-23 LAB — ANTI-SMOOTH MUSCLE ANTIBODY, IGG: Smooth Muscle Ab: 8 U (ref <20)

## 2014-12-23 LAB — ANTI-NUCLEAR AB-TITER (ANA TITER): ANA Titer 1: 1:320 {titer} — ABNORMAL HIGH

## 2014-12-25 ENCOUNTER — Ambulatory Visit
Admission: RE | Admit: 2014-12-25 | Discharge: 2014-12-25 | Disposition: A | Discharge status: Home / Self Care | Payer: PRIVATE HEALTH INSURANCE | Source: Ambulatory Visit | Attending: Family Medicine | Admitting: Family Medicine

## 2014-12-25 ENCOUNTER — Encounter: Payer: Self-pay | Admitting: Family Medicine

## 2014-12-25 DIAGNOSIS — R945 Abnormal results of liver function studies: Secondary | ICD-10-CM

## 2014-12-25 DIAGNOSIS — R1011 Right upper quadrant pain: Secondary | ICD-10-CM

## 2014-12-25 DIAGNOSIS — R7989 Other specified abnormal findings of blood chemistry: Secondary | ICD-10-CM

## 2014-12-25 LAB — ANTI-MICROSOMAL ANTIBODY LIVER / KIDNEY: LKM1 Ab: <=20 U (ref <=20.0)

## 2014-12-26 ENCOUNTER — Other Ambulatory Visit: Payer: Commercial Indemnity

## 2014-12-27 LAB — FECAL OCCULT BLOOD, IMMUNOCHEMICAL
FECAL OCCULT BLOOD: NEGATIVE
Fecal Occult Blood: NEGATIVE
Fecal Occult Blood: NEGATIVE

## 2014-12-30 ENCOUNTER — Encounter: Payer: Self-pay | Admitting: Family Medicine

## 2015-03-18 ENCOUNTER — Other Ambulatory Visit: Payer: Commercial Indemnity

## 2015-03-18 DIAGNOSIS — R7989 Other specified abnormal findings of blood chemistry: Secondary | ICD-10-CM

## 2015-03-18 DIAGNOSIS — R945 Abnormal results of liver function studies: Principal | ICD-10-CM

## 2015-03-18 LAB — HEPATIC FUNCTION PANEL
ALT: 23 U/L (ref 6–29)
AST: 18 U/L (ref 10–30)
Albumin: 4.1 g/dL (ref 3.6–5.1)
Alkaline Phosphatase: 65 U/L (ref 33–115)
BILIRUBIN INDIRECT: 0.3 mg/dL (ref 0.2–1.2)
Bilirubin, Direct: 0.1 mg/dL (ref <=0.2)
TOTAL PROTEIN: 6.6 g/dL (ref 6.1–8.1)
Total Bilirubin: 0.4 mg/dL (ref 0.2–1.2)

## 2015-03-19 ENCOUNTER — Encounter: Payer: Self-pay | Admitting: Family Medicine

## 2015-04-30 ENCOUNTER — Ambulatory Visit: Payer: Commercial Indemnity

## 2015-04-30 ENCOUNTER — Ambulatory Visit (INDEPENDENT_AMBULATORY_CARE_PROVIDER_SITE_OTHER): Payer: Commercial Indemnity | Admitting: *Deleted

## 2015-04-30 DIAGNOSIS — Z23 Encounter for immunization: Secondary | ICD-10-CM

## 2015-04-30 NOTE — Progress Notes (Signed)
Patient ID: Candice Jones, female   DOB: [birth year], 39 y.o.   MRN: [id number]  Patient seen in office for Influenza Vaccination.   Tolerated IM administration well.   Immunization history updated.

## 2016-09-27 ENCOUNTER — Telehealth: Payer: Self-pay | Admitting: Family Medicine

## 2016-09-27 NOTE — Telephone Encounter (Signed)
Pt called 2/24 son diagnosed with flu in our office. She now has symptoms Tamiflu 75mg  BID called in. Rest,fluids, fever reducer

## 2016-10-04 ENCOUNTER — Ambulatory Visit: Payer: PRIVATE HEALTH INSURANCE | Admitting: Physician Assistant

## 2016-11-29 ENCOUNTER — Encounter: Payer: Self-pay | Admitting: Internal Medicine

## 2016-11-29 ENCOUNTER — Ambulatory Visit (INDEPENDENT_AMBULATORY_CARE_PROVIDER_SITE_OTHER): Payer: Managed Care, Other (non HMO) | Admitting: Internal Medicine

## 2016-11-29 VITALS — BP 108/80 | HR 102 | Ht 64.0 in | Wt 186.0 lb

## 2016-11-29 DIAGNOSIS — K580 Irritable bowel syndrome with diarrhea: Secondary | ICD-10-CM

## 2016-11-29 DIAGNOSIS — Z800 Family history of malignant neoplasm of digestive organs: Secondary | ICD-10-CM

## 2016-11-29 DIAGNOSIS — Z1211 Encounter for screening for malignant neoplasm of colon: Secondary | ICD-10-CM

## 2016-11-29 NOTE — Progress Notes (Signed)
   Candice Jones 41 y.o. 12-13-1975 [id number]  Assessment & Plan:   Encounter Diagnoses  Name Primary?  . Family history of colon cancer - sister age 1 Yes  . Colon cancer screening   . Irritable bowel syndrome with diarrhea    Needs colonoscopy due to higher risk for colon cancer re: sister dx age 63  Ask sister if she was dx w/ Lynch syndrome - have sister ask oncologist directly - if so then would test Candice Jones if negative do not think she needs it if sister not tested would enourage that  The risks and benefits as well as alternatives of endoscopic procedure(s) have been discussed and reviewed. All questions answered. The patient agrees to proceed.  IBS doing well no changes  I appreciate the opportunity to care for this patient.  Cc: Candice Chimera, MD     Subjective:   Chief Complaint: sister dx w/ colon cancer  HPI Very nice 41 yo woman I know from IBS care and hemorrhoid banding - both of these under control. Her 32 yo sister was dx w/ colon cancer last Fall - is getting care in [facility] had resection and then chemotx ongoing. Sister has not heard if she has been tested for or has Lynch Syndrome.  No active sxs now  Medications, allergies, past medical history, past surgical history, family history and social history are reviewed and updated in the EMR.  Review of Systems Otherwise negative  Objective:   Physical Exam BP 108/80   Pulse (!) 102   Ht  (1.626 m)   Wt 186 lb (84.4 kg)   BMI 31.93 kg/m  NAD  15 minutes time spent with patient > half in counseling coordination of care

## 2016-11-29 NOTE — Patient Instructions (Addendum)
  You have been scheduled for a colonoscopy. Please follow written instructions given to you at your visit today.  Please pick up your prep supplies at the pharmacy. If you use inhalers (even only as needed), please bring them with you on the day of your procedure. Your physician has requested that you go to www.startemmi.com and enter the access code given to you at your visit today. This web site gives a general overview about your procedure. However, you should still follow specific instructions given to you by our office regarding your preparation for the procedure.  Ask your sister if she has been tested yet for Lynch Syndrome and let us know.   I appreciate the opportunity to care for you. Stan Head, MD, [facility]

## 2016-12-05 ENCOUNTER — Encounter: Payer: Self-pay | Admitting: Family Medicine

## 2016-12-05 ENCOUNTER — Ambulatory Visit (INDEPENDENT_AMBULATORY_CARE_PROVIDER_SITE_OTHER): Payer: Managed Care, Other (non HMO) | Admitting: Family Medicine

## 2016-12-05 VITALS — BP 110/82 | HR 102 | Temp 98.1°F | Resp 16 | Ht 65.0 in | Wt 186.0 lb

## 2016-12-05 DIAGNOSIS — J310 Chronic rhinitis: Secondary | ICD-10-CM

## 2016-12-05 DIAGNOSIS — J329 Chronic sinusitis, unspecified: Secondary | ICD-10-CM | POA: Diagnosis not present

## 2016-12-05 MED ORDER — CEFDINIR 300 MG PO CAPS: 300.0000 mg | ORAL_CAPSULE | Freq: Two times a day (BID) | ORAL | 0 refills | Status: DC

## 2016-12-05 NOTE — Progress Notes (Signed)
Subjective:    Patient ID: Candice Jones, female    DOB: 1976-02-01, 41 y.o.   MRN: [id number]  HPI Patient has a history of Mnire's disease with vertigo and roaring in the right ear. She states that last week, she started developing pain in her right ear that intensified over the weekend. She also reports pain in her right frontal sinus and in her right maxillary sinus along with pain in her teeth. She denies any worsening vertigo or worsening hearing loss. She continues to have a roaring sound in her right ear. She also reports fevers and chills and generalized body aches. Past Medical History:  Diagnosis Date  . Adenomyosis   . Asthma   . Gall stones   . Gestational diabetes mellitus   . Hyperlipidemia   . IBS (irritable bowel syndrome)   . Kidney stones   . Meniere's syndrome   . Vertigo    Past Surgical History:  Procedure Laterality Date  . CESAREAN SECTION  08/1999, 05/2003  . CHOLECYSTECTOMY  2011  . CYSTECTOMY  1997   left wrist   . ENDOMETRIAL ABLATION  01/2010  . HEMORRHOID BANDING  2015  . MINOR HEMORRHOIDECTOMY  04/2014   thrombosed  . WISDOM TOOTH EXTRACTION  1996/1997   Current Outpatient Prescriptions on File Prior to Visit  Medication Sig Dispense Refill  . levonorgestrel-ethinyl estradiol (SEASONALE,INTROVALE,JOLESSA) 0.15-0.03 MG tablet Take 1 tablet by mouth daily.    . Probiotic Product (ALIGN) 4 MG CAPS Take 1 capsule (4 mg total) by mouth daily.    Marland Kitchen triamterene-hydrochlorothiazide (DYAZIDE) 37.5-25 MG per capsule Take 1 capsule by mouth daily. For meniere's dz  5  . fluticasone (VERAMYST) 27.5 MCG/SPRAY nasal spray Place 2 sprays into the nose daily.     No current facility-administered medications on file prior to visit.    Allergies  Allergen Reactions  . Augmentin [Amoxicillin-Pot Clavulanate] Nausea And Vomiting    Vomiting   . Ciprofloxacin Hcl     Arm turned red  . Codeine Other (See Comments)    Makes pain worse  . Erythromycin     Rash     . Sulfur     rash   Social History   Social History  . Marital status: Married    Spouse name: N/A  . Number of children: N/A  . Years of education: N/A   Occupational History  .  [employer]   Social History Main Topics  . Smoking status: Never Smoker  . Smokeless tobacco: Never Used  . Alcohol use No  . Drug use: No  . Sexual activity: Yes   Other Topics Concern  . Not on file   Social History Narrative   Married, 2 sons and 1 daughter   [employer]stitute [employer]   No caffeine      Review of Systems  All other systems reviewed and are negative.      Objective:   Physical Exam  Constitutional: She appears well-developed and well-nourished.  HENT:  Right Ear: Tympanic membrane and ear canal normal.  Left Ear: Tympanic membrane and ear canal normal.  Nose: Mucosal edema and rhinorrhea present. Right sinus exhibits maxillary sinus tenderness and frontal sinus tenderness.  Neck: Neck supple.  Cardiovascular: Normal rate, regular rhythm and normal heart sounds.   Pulmonary/Chest: Effort normal and breath sounds normal. No respiratory distress. She has no wheezes. She has no rales.  Lymphadenopathy:    She has no cervical  adenopathy.  Vitals reviewed.         Assessment & Plan:  Rhinosinusitis - Plan: cefdinir (OMNICEF) 300 MG capsule  Patient has Mnire's disease but fevers, chills, right sinus pain in her frontal and maxillary sinus, dental pain, etc. would not go along with that syndrome. I believe that she has a sinus infection. There is no evidence of an ear infection. Begin Omnicef 300 mg by mouth twice a day for 10 days. Her ENT doctor has already called out prednisone for possible Mnire's exacerbation which I believe may also help her sinus infection. Recheck if no better in 1 week.

## 2016-12-22 ENCOUNTER — Telehealth: Payer: Self-pay | Admitting: Family Medicine

## 2016-12-22 DIAGNOSIS — J328 Other chronic sinusitis: Secondary | ICD-10-CM

## 2016-12-22 NOTE — Telephone Encounter (Signed)
Pt is still having issues with being sick and per pickard she was to call back if it wasn't gone after completing antibiotics.

## 2016-12-23 NOTE — Addendum Note (Signed)
Addended by: Legrand RamsWILLIS, SANDY B on: 12/23/2016 10:08 AM   Modules accepted: Orders

## 2016-12-23 NOTE — Telephone Encounter (Signed)
Patient aware of providers recommendations and CT ordered. Pt would like to know if there is anything else she can do in the meantime for her sinuses? She is using Flonase and Claritin.

## 2016-12-23 NOTE — Telephone Encounter (Signed)
Continue that until we have ct results back.  May not be sinus infection.

## 2016-12-23 NOTE — Telephone Encounter (Signed)
I would proceed with ct of the sinuses to eval chronic sinusitis.

## 2017-01-04 ENCOUNTER — Telehealth: Payer: Self-pay | Admitting: Family Medicine

## 2017-01-04 NOTE — Telephone Encounter (Signed)
Spoke to pt and she states that she talked to her ENT yesterday while her child was being seen and he told her that whatever she had has cleared but did put her on another allergy medication and she has a f/u appt with him in 1 month.       ----- Message ----- From: Salley Scarleturham, Kawanta F, MD Sent: 01/02/2017  2:11 PM To: Ricard DillonSandy B Foster Frericks Subject: FW: CT denied                  CT scan is not covered by insurance at this time, send to ENT to evaluate in office recommend continuing allergy meds until then  ----- Message ----- From: Roddie McHarris, Abbie R, CMA Sent: 01/02/2017  9:50 AM To: Donita BrooksWarren T Pickard, MD Subject: CT denied                    [facility]. Recommending 4 week trial of antibiotics and or allergy med. After they have followed recommendations, if sinus problem has returned within 2 months of treatment then could request CT of sinus again. Please advise.

## 2017-01-12 ENCOUNTER — Encounter: Payer: Self-pay | Admitting: Internal Medicine

## 2017-01-23 ENCOUNTER — Encounter: Payer: Self-pay | Admitting: Internal Medicine

## 2017-01-23 ENCOUNTER — Ambulatory Visit (AMBULATORY_SURGERY_CENTER): Payer: Managed Care, Other (non HMO) | Admitting: Internal Medicine

## 2017-01-23 VITALS — BP 128/76 | HR 77 | Temp 98.4°F | Resp 12 | Ht 64.0 in | Wt 186.0 lb

## 2017-01-23 DIAGNOSIS — Z1211 Encounter for screening for malignant neoplasm of colon: Secondary | ICD-10-CM | POA: Diagnosis not present

## 2017-01-23 DIAGNOSIS — Z1212 Encounter for screening for malignant neoplasm of rectum: Secondary | ICD-10-CM

## 2017-01-23 DIAGNOSIS — Z800 Family history of malignant neoplasm of digestive organs: Secondary | ICD-10-CM

## 2017-01-23 MED ORDER — SODIUM CHLORIDE 0.9 % IV SOLN: 500.0000 mL | INTRAVENOUS | Status: DC

## 2017-01-23 NOTE — Op Note (Signed)
[facility] Patient Name: Candice Jones Procedure Date: 01/23/2017 2:26 PM MRN: [id number] Endoscopist: Iva Boop , MD Age: 41 Referring MD:  Date of Birth: 1975-11-24 Gender: Female Account #: [id number] Procedure:                Colonoscopy Indications:              Screening in patient at increased risk: Colorectal                            cancer in sister before age 88 Medicines:                Propofol per Anesthesia, Monitored Anesthesia Care Procedure:                Pre-Anesthesia Assessment:                           - Prior to the procedure, a History and Physical                            was performed, and patient medications and                            allergies were reviewed. The patient's tolerance of                            previous anesthesia was also reviewed. The risks                            and benefits of the procedure and the sedation                            options and risks were discussed with the patient.                            All questions were answered, and informed consent                            was obtained. Prior Anticoagulants: The patient has                            taken no previous anticoagulant or antiplatelet                            agents. ASA Grade Assessment: II - A patient with                            mild systemic disease. After reviewing the risks                            and benefits, the patient was deemed in                            satisfactory condition to undergo the procedure.  After obtaining informed consent, the colonoscope                            was passed under direct vision. Throughout the                            procedure, the patient's blood pressure, pulse, and                            oxygen saturations were monitored continuously. The                            Colonoscope was introduced through the anus and                            advanced  to the the cecum, identified by                            appendiceal orifice and ileocecal valve. The                            colonoscopy was performed without difficulty. The                            patient tolerated the procedure well. The quality                            of the bowel preparation was good. The ileocecal                            valve, appendiceal orifice, and rectum were                            photographed. The bowel preparation used was                            Miralax. Scope In: 2:32:50 PM Scope Out: 2:45:32 PM Scope Withdrawal Time: 0 hours 9 minutes 47 seconds  Total Procedure Duration: 0 hours 12 minutes 42 seconds  Findings:                 The perianal and digital rectal examinations were                            normal.                           The entire examined colon appeared normal on direct                            and retroflexion views. Complications:            No immediate complications. Estimated Blood Loss:     Estimated blood loss: none. Impression:               - The entire examined colon is normal on direct and  retroflexion views.                           - No specimens collected.                           - Family history of colon cancer sister dx age 44 -                            no genetic testing done to our knowledge Recommendation:           - Patient has a contact number available for                            emergencies. The signs and symptoms of potential                            delayed complications were discussed with the                            patient. Return to normal activities tomorrow.                            Written discharge instructions were provided to the                            patient.                           - Continue present medications.                           - Resume previous diet.                           - Repeat colonoscopy in 5 years for  screening                            purposes. Iva Boop, MD 01/23/2017 2:52:04 PM This report has been signed electronically.

## 2017-01-23 NOTE — Patient Instructions (Addendum)
   No polyps, no cancer seen!  Your next routine colonoscopy should be in 5 years - 2023.  I appreciate the opportunity to care for you. Iva Booparl E. Zahira Brummond, MD, FACG  YOU HAD AN ENDOSCOPIC PROCEDURE TODAY AT THE [facility]:   Refer to the procedure report that was given to you for any specific questions about what was found during the examination.  If the procedure report does not answer your questions, please call your gastroenterologist to clarify.  If you requested that your care partner not be given the details of your procedure findings, then the procedure report has been included in a sealed envelope for you to review at your convenience later.  YOU SHOULD EXPECT: Some feelings of bloating in the abdomen. Passage of more gas than usual.  Walking can help get rid of the air that was put into your GI tract during the procedure and reduce the bloating. If you had a lower endoscopy (such as a colonoscopy or flexible sigmoidoscopy) you may notice spotting of blood in your stool or on the toilet paper. If you underwent a bowel prep for your procedure, you may not have a normal bowel movement for a few days.  Please Note:  You might notice some irritation and congestion in your nose or some drainage.  This is from the oxygen used during your procedure.  There is no need for concern and it should clear up in a day or so.  SYMPTOMS TO REPORT IMMEDIATELY:   Following lower endoscopy (colonoscopy or flexible sigmoidoscopy):  Excessive amounts of blood in the stool  Significant tenderness or worsening of abdominal pains  Swelling of the abdomen that is new, acute  Fever of 100F or higher For urgent or emergent issues, a gastroenterologist can be reached at any hour by calling [phone].   DIET:  We do recommend a small meal at first, but then you may proceed to your regular diet.  Drink plenty of fluids but you should avoid alcoholic beverages for 24 hours.  ACTIVITY:  You  should plan to take it easy for the rest of today and you should NOT DRIVE or use heavy machinery until tomorrow (because of the sedation medicines used during the test).    FOLLOW UP: Our staff will call the number listed on your records the next business day following your procedure to check on you and address any questions or concerns that you may have regarding the information given to you following your procedure. If we do not reach you, we will leave a message.  However, if you are feeling well and you are not experiencing any problems, there is no need to return our call.  We will assume that you have returned to your regular daily activities without incident.  If any biopsies were taken you will be contacted by phone or by letter within the next 1-3 weeks.  Please call us at [phone] if you have not heard about the biopsies in 3 weeks.    SIGNATURES/CONFIDENTIALITY: You and/or your care partner have signed paperwork which will be entered into your electronic medical record.  These signatures attest to the fact that that the information above on your After Visit Summary has been reviewed and is understood.  Full responsibility of the confidentiality of this discharge information lies with you and/or your care-partner.

## 2017-01-23 NOTE — Progress Notes (Signed)
Report to PACU, RN, vss, BBS= Clear.  

## 2017-01-24 ENCOUNTER — Telehealth: Payer: Self-pay

## 2017-01-24 ENCOUNTER — Encounter: Payer: Self-pay | Admitting: Internal Medicine

## 2017-01-24 NOTE — Telephone Encounter (Signed)
  Follow up Call-  Call back number 01/23/2017  Post procedure Call Back phone  # [phone]  Permission to leave phone message Yes  Some recent data might be hidden     Patient questions:  Do you have a fever, pain , or abdominal swelling? No. Pain Score  0 *  Have you tolerated food without any problems? Yes.    Have you been able to return to your normal activities? Yes.    Do you have any questions about your discharge instructions: Diet   No. Medications  No. Follow up visit  No.  Do you have questions or concerns about your Care? No.  Actions: * If pain score is 4 or above: No action needed, pain <4.

## 2017-02-02 ENCOUNTER — Encounter: Payer: Self-pay | Admitting: Internal Medicine

## 2017-02-02 DIAGNOSIS — Z800 Family history of malignant neoplasm of digestive organs: Secondary | ICD-10-CM | POA: Insufficient documentation

## 2017-05-08 ENCOUNTER — Ambulatory Visit (INDEPENDENT_AMBULATORY_CARE_PROVIDER_SITE_OTHER): Payer: BLUE CROSS/BLUE SHIELD

## 2017-05-08 DIAGNOSIS — Z23 Encounter for immunization: Secondary | ICD-10-CM | POA: Diagnosis not present

## 2017-09-05 ENCOUNTER — Telehealth: Payer: Self-pay

## 2017-09-05 NOTE — Telephone Encounter (Signed)
Patient called complaining of a sore throat, headache, fever greater than 100, sweats,and a cough that started Sunday. Patient was advised to take otc medication theraflu to see if that helps with her symptoms. I scheduled the patient an appointment for 09/06/17 however patient was informed to go to urgent care or ER if her symptoms worsened. Patient verbalizes understanding

## 2017-09-06 ENCOUNTER — Encounter: Payer: Self-pay | Admitting: Physician Assistant

## 2017-09-06 ENCOUNTER — Encounter: Payer: Self-pay | Admitting: Family Medicine

## 2017-09-06 ENCOUNTER — Other Ambulatory Visit: Payer: Self-pay

## 2017-09-06 ENCOUNTER — Ambulatory Visit: Payer: BC Managed Care – PPO | Admitting: Physician Assistant

## 2017-09-06 VITALS — BP 116/78 | HR 114 | Temp 98.8°F | Resp 18 | Wt 197.4 lb

## 2017-09-06 DIAGNOSIS — R52 Pain, unspecified: Secondary | ICD-10-CM

## 2017-09-06 DIAGNOSIS — R509 Fever, unspecified: Secondary | ICD-10-CM | POA: Diagnosis not present

## 2017-09-06 DIAGNOSIS — J988 Other specified respiratory disorders: Secondary | ICD-10-CM

## 2017-09-06 DIAGNOSIS — B9789 Other viral agents as the cause of diseases classified elsewhere: Secondary | ICD-10-CM

## 2017-09-06 LAB — INFLUENZA A AND B AG, IMMUNOASSAY
INFLUENZA A ANTIGEN: NOT DETECTED
INFLUENZA B ANTIGEN: NOT DETECTED

## 2017-09-06 NOTE — Progress Notes (Signed)
Patient ID: Candice Jones MRN: [id number], DOB: 07/27/1976, 42 y.o. Date of Encounter: 09/06/2017, 9:40 AM    Chief Complaint:  Chief Complaint  Patient presents with  . Chills  . Headache  . low grade fever     HPI: 42 y.o. year old female presents with above.  States that yesterday at 10:00 AM is when symptoms started all of a sudden.  Says that she had to leave work yesterday at 12:00 because all the sudden symptoms were so bad. Had drainage pouring out of her nose and down her throat.  Says "it was literally dripping out like a faucet"--  dripping out of her nose. Says that last night she felt hot, cold, hot, cold. Says that "even her teeth hurt ".  Says that her head hurts and feels like it has pressure. Says that she just feels terrible.  Feels weak like she can hardly stay awake. Temperature was 99.8 yesterday and 99.3 this morning.  Temperature here reading 98.8.  Has had no sore throat. Has had no cough. Has felt a little nauseous.  No vomiting.  No diarrhea.  No abdominal pain.    Home Meds:   Outpatient Medications Prior to Visit  Medication Sig Dispense Refill  . fluticasone (FLONASE) 50 MCG/ACT nasal spray Place 1 spray into both nostrils daily.    Marland Kitchen. levonorgestrel-ethinyl estradiol (SEASONALE,INTROVALE,JOLESSA) 0.15-0.03 MG tablet Take 1 tablet by mouth daily.    Marland Kitchen. loratadine (CLARITIN) 10 MG tablet Take 10 mg by mouth daily.    . Probiotic Product (ALIGN) 4 MG CAPS Take 1 capsule (4 mg total) by mouth daily.    Marland Kitchen. triamterene-hydrochlorothiazide (DYAZIDE) 37.5-25 MG per capsule Take 1 capsule by mouth daily. For meniere's dz  5  . fluticasone (VERAMYST) 27.5 MCG/SPRAY nasal spray Place 2 sprays into the nose daily.     Facility-Administered Medications Prior to Visit  Medication Dose Route Frequency Provider Last Rate Last Dose  . 0.9 %  sodium chloride infusion  500 mL Intravenous Continuous Iva BoopGessner, Carl E, MD        Allergies:  Allergies  Allergen  Reactions  . Augmentin [Amoxicillin-Pot Clavulanate] Nausea And Vomiting    Vomiting   . Ciprofloxacin Hcl     Arm turned red  . Codeine Other (See Comments)    Makes pain worse  . Erythromycin     Rash   . Sulfur     rash      Review of Systems: See HPI for pertinent ROS. All other ROS negative.    Physical Exam: Blood pressure 116/78, pulse (!) 114, temperature 98.8 F (37.1 C), temperature source Oral, resp. rate 18, weight 89.5 kg (197 lb 6.4 oz), SpO2 98 %., Body mass index is 33.88 kg/m. General:  WF. Appears in no acute distress. HEENT: Normocephalic, atraumatic, eyes without discharge, sclera non-icteric, nares are without discharge. Bilateral auditory canals clear, TM's are without perforation, pearly grey and translucent with reflective cone of light bilaterally. Oral cavity moist, posterior pharynx without exudate, erythema, peritonsillar abscess.  Neck: Supple. No thyromegaly. No lymphadenopathy. Lungs: Clear bilaterally to auscultation without wheezes, rales, or rhonchi. Breathing is unlabored. Heart: Regular rhythm. No murmurs, rubs, or gallops. Msk:  Strength and tone normal for age. Extremities/Skin: Warm and dry.  Neuro: Alert and oriented X 3. Moves all extremities spontaneously. Gait is normal. CNII-XII grossly in tact. Psych:  Responds to questions appropriately with a normal affect.      ASSESSMENT AND PLAN:  42  y.o. year old female with  1. Viral respiratory infection Flu test negative. Not clinically consistent with flu.  No high fever.  No significant cough or sore throat. Suspect another type of viral illness. She is to use Tylenol and Motrin to help with all the aches and pains low-grade fever. Can use over-the-counter decongestant as well. Follow-up if symptoms worsen significantly or fever increases greater than 100 or if symptoms persist greater than 7-10 days. Note given to cover her leaving work early yesterday and she will be out today  through Friday with plans to return Monday.  Follow-up if needed.  2. Low grade fever - Influenza A and B Ag, Immunoassay  3. Body aches - Influenza A and B Ag, Immunoassay   Signed, [address] [place], [place], [facility] 09/06/2017 9:40 AM

## 2018-01-30 ENCOUNTER — Ambulatory Visit: Payer: BC Managed Care – PPO | Admitting: Family Medicine

## 2018-01-30 ENCOUNTER — Encounter: Payer: Self-pay | Admitting: Family Medicine

## 2018-01-30 VITALS — BP 120/70 | HR 96 | Temp 98.3°F | Resp 16 | Ht 65.0 in | Wt 202.0 lb

## 2018-01-30 DIAGNOSIS — E119 Type 2 diabetes mellitus without complications: Secondary | ICD-10-CM | POA: Diagnosis not present

## 2018-01-30 MED ORDER — METFORMIN HCL 500 MG PO TABS: 500.0000 mg | ORAL_TABLET | Freq: Two times a day (BID) | ORAL | 3 refills | Status: DC

## 2018-01-30 NOTE — Progress Notes (Signed)
Subjective:    Patient ID: Candice Jones, female    DOB: February 11, 1976, 42 y.o.   MRN: [id number]  HPI Patient recently saw GYN who performed lab work and found her fasting blood sugar to be greater than 150.  Follow-up hemoglobin A1c was found to be 6.9.  Her LDL cholesterol was also elevated greater than 130.  She is here today to discuss these lab results.  She is gained 16 pounds since last time I saw her a year ago.  However she states that she has been trying to eat a low carbohydrate diet over the last several months.  She is very little bread rice potatoes or pasta.  She is drinking no soda no juice.  She is avoiding junk food.  She eats very few if any sweets or sugar or cakes or cookies.  She is trying to eat more protein and more salads.  She is not getting any regular exercise other than just normal daily activity.  She does report some polyuria and some polydipsia she denies any blurry vision.  She denies any neuropathy in her feet. Past Medical History:  Diagnosis Date  . Adenomyosis   . Asthma   . Gall stones   . Gestational diabetes mellitus   . Hyperlipidemia   . IBS (irritable bowel syndrome)   . Kidney stones   . Meniere's syndrome   . Vertigo    Past Surgical History:  Procedure Laterality Date  . CESAREAN SECTION  08/1999, 05/2003  . CHOLECYSTECTOMY  2011  . CYSTECTOMY  1997   left wrist   . ENDOMETRIAL ABLATION  01/2010  . HEMORRHOID BANDING  2015  . MINOR HEMORRHOIDECTOMY  04/2014   thrombosed  . WISDOM TOOTH EXTRACTION  1996/1997   Current Outpatient Medications on File Prior to Visit  Medication Sig Dispense Refill  . fluticasone (FLONASE) 50 MCG/ACT nasal spray Place 1 spray into both nostrils daily.    Marland Kitchen levonorgestrel-ethinyl estradiol (SEASONALE,INTROVALE,JOLESSA) 0.15-0.03 MG tablet Take 1 tablet by mouth daily.    Marland Kitchen loratadine (CLARITIN) 10 MG tablet Take 10 mg by mouth daily.    . Probiotic Product (ALIGN) 4 MG CAPS Take 1 capsule (4 mg total) by mouth  daily.    Marland Kitchen triamterene-hydrochlorothiazide (DYAZIDE) 37.5-25 MG per capsule Take 1 capsule by mouth daily. For meniere's dz  5   Current Facility-Administered Medications on File Prior to Visit  Medication Dose Route Frequency Provider Last Rate Last Dose  . 0.9 %  sodium chloride infusion  500 mL Intravenous Continuous Iva Boop, MD       Allergies  Allergen Reactions  . Augmentin [Amoxicillin-Pot Clavulanate] Nausea And Vomiting    Vomiting   . Ciprofloxacin Hcl     Arm turned red  . Codeine Other (See Comments)    Makes pain worse  . Erythromycin     Rash   . Sulfur     rash   Social History   Socioeconomic History  . Marital status: Married    Spouse name: Not on file  . Number of children: Not on file  . Years of education: Not on file  . Highest education level: Not on file  Occupational History    Employer: [employer]  Social Needs  . Financial resource strain: Not on file  . Food insecurity:    Worry: Not on file    Inability: Not on file  . Transportation needs:    Medical: Not on file  Non-medical: Not on file  Tobacco Use  . Smoking status: Never Smoker  . Smokeless tobacco: Never Used  Substance and Sexual Activity  . Alcohol use: No  . Drug use: No  . Sexual activity: Yes  Lifestyle  . Physical activity:    Days per week: Not on file    Minutes per session: Not on file  . Stress: Not on file  Relationships  . Social connections:    Talks on phone: Not on file    Gets together: Not on file    Attends religious service: Not on file    Active member of club or organization: Not on file    Attends meetings of clubs or organizations: Not on file    Relationship status: Not on file  . Intimate partner violence:    Fear of current or ex partner: Not on file    Emotionally abused: Not on file    Physically abused: Not on file    Forced sexual activity: Not on file  Other Topics Concern  . Not on file  Social History  Narrative   Married, 2 sons and 1 daughter   Substitute te[employer]   No caffeine      Review of Systems  Constitutional: Negative.   All other systems reviewed and are negative.      Objective:   Physical Exam  Constitutional: She appears well-developed and well-nourished. No distress.  Cardiovascular: Normal rate, regular rhythm and normal heart sounds.  Pulmonary/Chest: Effort normal and breath sounds normal. No stridor. No respiratory distress. She has no wheezes.  Musculoskeletal: She exhibits no edema.  Skin: She is not diaphoretic.  Vitals reviewed.         Assessment & Plan:  Controlled type 2 diabetes mellitus without complication, without long-term current use of insulin (HCC)  Spent more than 25 minutes today with the patient discussing her laboratory findings and the implications.  She is apparently developed type 2 diabetes mellitus.  We discussed treatment options including lifestyle changes such as 10 to 15 pounds weight loss, 30 minutes a day of aerobic exercise, a low carbohydrate diet.  She states that she would like to try to increase her exercise however she has been unsuccessful in losing weight and she states that she is eating a well-balanced diet now for several months.  She is emphatic that her diet is not the problem.  Therefore she believes that she will likely need medication to assist in lowering her blood sugar.  We will start her on metformin 500 mg p.o. twice daily and recheck the patient back in 3 months.  At that time repeat a fasting lipid panel along with a hemoglobin A1c as well as a urine microalbumin.  Patient will require a statin due to an elevated ten-year risk of cardiovascular disease.  We will also likely recommend putting the patient on a low-dose ACE inhibitor however patient would like to take medication addition slowly

## 2018-02-09 ENCOUNTER — Telehealth: Payer: Self-pay

## 2018-02-09 MED ORDER — BLOOD GLUCOSE MONITORING SUPPL KIT: PACK | 0 refills | Status: DC

## 2018-02-09 MED ORDER — FREESTYLE LANCETS MISC: 12 refills | Status: AC

## 2018-02-09 MED ORDER — GLUCOSE BLOOD VI STRP: ORAL_STRIP | 12 refills | Status: DC

## 2018-02-09 NOTE — Telephone Encounter (Signed)
Patient called in requesting a glucose meter to check her blood sugar. Patient recently started metformin and states she has been sweating on top of lips and back of her neck and having headaches and feeling hungry.   I informed patient I will send in a meter for her to check her blood sugar and for her to check fasting blood sugar keep a log and if she feel her blood sugar drops  to eat something high in sugar and follow up with PCP if symptoms do not improve.   Patient was also instructed to go to Urgent Care if symptoms got worse over the weekend or if her readings are lower than 90. Patient verbalizes understanding

## 2018-03-30 LAB — HM DIABETES EYE EXAM

## 2018-04-03 ENCOUNTER — Encounter: Payer: Self-pay | Admitting: *Deleted

## 2018-05-09 ENCOUNTER — Ambulatory Visit (INDEPENDENT_AMBULATORY_CARE_PROVIDER_SITE_OTHER): Payer: BC Managed Care – PPO | Admitting: Family Medicine

## 2018-05-09 ENCOUNTER — Other Ambulatory Visit: Payer: BC Managed Care – PPO

## 2018-05-09 DIAGNOSIS — Z0184 Encounter for antibody response examination: Secondary | ICD-10-CM

## 2018-05-09 DIAGNOSIS — R7301 Impaired fasting glucose: Secondary | ICD-10-CM

## 2018-05-09 DIAGNOSIS — Z23 Encounter for immunization: Secondary | ICD-10-CM | POA: Diagnosis not present

## 2018-05-09 DIAGNOSIS — Z789 Other specified health status: Secondary | ICD-10-CM

## 2018-05-09 DIAGNOSIS — Z13220 Encounter for screening for lipoid disorders: Secondary | ICD-10-CM

## 2018-05-10 ENCOUNTER — Encounter: Payer: Self-pay | Admitting: Family Medicine

## 2018-05-11 LAB — COMPREHENSIVE METABOLIC PANEL
AG Ratio: 1.7 (calc) (ref 1.0–2.5)
ALBUMIN MSPROF: 3.8 g/dL (ref 3.6–5.1)
ALT: 17 U/L (ref 6–29)
AST: 18 U/L (ref 10–30)
Alkaline phosphatase (APISO): 58 U/L (ref 33–115)
BUN: 11 mg/dL (ref 7–25)
CO2: 28 mmol/L (ref 20–32)
CREATININE: 0.72 mg/dL (ref 0.50–1.10)
Calcium: 9.2 mg/dL (ref 8.6–10.2)
Chloride: 104 mmol/L (ref 98–110)
GLUCOSE: 110 mg/dL — AB (ref 65–99)
Globulin: 2.3 g/dL (calc) (ref 1.9–3.7)
POTASSIUM: 3.9 mmol/L (ref 3.5–5.3)
SODIUM: 141 mmol/L (ref 135–146)
TOTAL PROTEIN: 6.1 g/dL (ref 6.1–8.1)
Total Bilirubin: 0.4 mg/dL (ref 0.2–1.2)

## 2018-05-11 LAB — CBC WITH DIFFERENTIAL/PLATELET
BASOS PCT: 0.5 %
Basophils Absolute: 37 cells/uL (ref 0–200)
EOS PCT: 0.8 %
Eosinophils Absolute: 58 cells/uL (ref 15–500)
HEMATOCRIT: 45.6 % — AB (ref 35.0–45.0)
HEMOGLOBIN: 15.7 g/dL — AB (ref 11.7–15.5)
LYMPHS ABS: 2095 {cells}/uL (ref 850–3900)
MCH: 29.8 pg (ref 27.0–33.0)
MCHC: 34.4 g/dL (ref 32.0–36.0)
MCV: 86.7 fL (ref 80.0–100.0)
MPV: 9.4 fL (ref 7.5–12.5)
Monocytes Relative: 6.8 %
NEUTROS PCT: 63.2 %
Neutro Abs: 4614 cells/uL (ref 1500–7800)
Platelets: 349 10*3/uL (ref 140–400)
RBC: 5.26 10*6/uL — AB (ref 3.80–5.10)
RDW: 12.1 % (ref 11.0–15.0)
TOTAL LYMPHOCYTE: 28.7 %
WBC mixed population: 496 cells/uL (ref 200–950)
WBC: 7.3 10*3/uL (ref 3.8–10.8)

## 2018-05-11 LAB — TEST AUTHORIZATION

## 2018-05-11 LAB — LIPID PANEL
CHOL/HDL RATIO: 4.7 (calc) (ref <5.0)
CHOLESTEROL: 178 mg/dL (ref <200)
HDL: 38 mg/dL — ABNORMAL LOW (ref >50)
LDL Cholesterol (Calc): 113 mg/dL (calc) — ABNORMAL HIGH
NON-HDL CHOLESTEROL (CALC): 140 mg/dL — AB (ref <130)
Triglycerides: 154 mg/dL — ABNORMAL HIGH (ref <150)

## 2018-05-11 LAB — MEASLES/MUMPS/RUBELLA IMMUNITY
Mumps IgG: 141 AU/mL
RUBELLA: 2.52 {index}
Rubeola IgG: 81.7 AU/mL

## 2018-05-11 LAB — HEPATITIS B SURFACE ANTIBODY, QUANTITATIVE

## 2018-05-11 LAB — HEMOGLOBIN A1C W/OUT EAG: Hgb A1c MFr Bld: 6 % of total Hgb — ABNORMAL HIGH (ref <5.7)

## 2018-05-11 LAB — VARICELLA ZOSTER ANTIBODY, IGG: VARICELLA IGG: 1420 {index}

## 2018-05-14 ENCOUNTER — Ambulatory Visit: Payer: BC Managed Care – PPO | Admitting: Family Medicine

## 2018-05-14 ENCOUNTER — Encounter: Payer: Self-pay | Admitting: Family Medicine

## 2018-05-14 VITALS — BP 128/80 | HR 108 | Temp 98.4°F | Resp 16 | Ht 65.0 in | Wt 190.0 lb

## 2018-05-14 DIAGNOSIS — E119 Type 2 diabetes mellitus without complications: Secondary | ICD-10-CM | POA: Diagnosis not present

## 2018-05-14 DIAGNOSIS — Z23 Encounter for immunization: Secondary | ICD-10-CM

## 2018-05-14 MED ORDER — METFORMIN HCL ER 500 MG PO TB24: 1000.0000 mg | ORAL_TABLET | Freq: Every day | ORAL | 11 refills | Status: DC

## 2018-05-14 MED ORDER — ATORVASTATIN CALCIUM 10 MG PO TABS: 10.0000 mg | ORAL_TABLET | Freq: Every day | ORAL | 3 refills | Status: DC

## 2018-05-14 NOTE — Progress Notes (Signed)
 Subjective:    Patient ID: Candice Jones, female    DOB: 03/10/1976, 42 y.o.   MRN: [id number]  HPI  02/07/18 Patient recently saw GYN who performed lab work and found her fasting blood sugar to be greater than 150.  Follow-up hemoglobin A1c was found to be 6.9.  Her LDL cholesterol was also elevated greater than 130.  She is here today to discuss these lab results.  She is gained 16 pounds since last time I saw her a year ago.  However she states that she has been trying to eat a low carbohydrate diet over the last several months.  She is very little bread rice potatoes or pasta.  She is drinking no soda no juice.  She is avoiding junk food.  She eats very few if any sweets or sugar or cakes or cookies.  She is trying to eat more protein and more salads.  She is not getting any regular exercise other than just normal daily activity.  She does report some polyuria and some polydipsia she denies any blurry vision.  She denies any neuropathy in her feet.  At that time, my plan was: Spent more than 25 minutes today with the patient discussing her laboratory findings and the implications.  She is apparently developed type 2 diabetes mellitus.  We discussed treatment options including lifestyle changes such as 10 to 15 pounds weight loss, 30 minutes a day of aerobic exercise, a low carbohydrate diet.  She states that she would like to try to increase her exercise however she has been unsuccessful in losing weight and she states that she is eating a well-balanced diet now for several months.  She is emphatic that her diet is not the problem.  Therefore she believes that she will likely need medication to assist in lowering her blood sugar.  We will start her on metformin 500 mg p.o. twice daily and recheck the patient back in 3 months.  At that time repeat a fasting lipid panel along with a hemoglobin A1c as well as a urine microalbumin.  Patient will require a statin due to an elevated ten-year risk of  cardiovascular disease.  We will also likely recommend putting the patient on a low-dose ACE inhibitor however patient would like to take medication addition slowly  05/14/18 Patient has been taking metformin 500 mg twice a day.  She forgets the second dose quite often.  However she is worked very hard on her diet and exercise and her hemoglobin A1c has fallen from 6.9-6.0.  Her LDL cholesterol however is still elevated at 113 and her HDL is still low at 38.  I calculated her 10% risk of coronary artery disease and found to be 2.1%.  However because of her diabetes, the patient is recommended to be on a moderate intensity statin.  Her blood pressure today is well controlled at 128/80.  She also had several labs drawn to check for immunization status.  Her antibodies to hepatitis B surface antigen are undetectable.  Therefore she would like to repeat that series.  She is also due for Pneumovax 23 because of her diabetes however she prefers to defer this at the present time. Past Medical History:  Diagnosis Date  . Adenomyosis   . Asthma   . Gall stones   . Gestational diabetes mellitus   . Hyperlipidemia   . IBS (irritable bowel syndrome)   . Kidney stones   . Meniere's syndrome   . Vertigo      Past Surgical History:  Procedure Laterality Date  . CESAREAN SECTION  08/1999, 05/2003  . CHOLECYSTECTOMY  2011  . CYSTECTOMY  1997   left wrist   . ENDOMETRIAL ABLATION  01/2010  . HEMORRHOID BANDING  2015  . MINOR HEMORRHOIDECTOMY  04/2014   thrombosed  . WISDOM TOOTH EXTRACTION  1996/1997   Current Outpatient Medications on File Prior to Visit  Medication Sig Dispense Refill  . Blood Glucose Monitoring Suppl KIT Use to check fasting glucose daily ICD10: R73.01.Dispense based on insurance preference 1 each 0  . fluticasone (FLONASE) 50 MCG/ACT nasal spray Place 1 spray into both nostrils daily.    Marland Kitchen glucose blood test strip Use with glucose meter to check blood sugar.ICD10: R73.01.Dispense based  on insurance preference 100 each 12  . Lancets (FREESTYLE) lancets Use to check blood sugar ICD10: R73.01.Dispense based on insurance preference 100 each 12  . levonorgestrel-ethinyl estradiol (SEASONALE,INTROVALE,JOLESSA) 0.15-0.03 MG tablet Take 1 tablet by mouth daily.    Marland Kitchen loratadine (CLARITIN) 10 MG tablet Take 10 mg by mouth daily.    . metFORMIN (GLUCOPHAGE) 500 MG tablet Take 1 tablet (500 mg total) by mouth 2 (two) times daily with a meal. 180 tablet 3  . Probiotic Product (ALIGN) 4 MG CAPS Take 1 capsule (4 mg total) by mouth daily.    Marland Kitchen triamterene-hydrochlorothiazide (DYAZIDE) 37.5-25 MG per capsule Take 1 capsule by mouth daily. For meniere's dz  5   Current Facility-Administered Medications on File Prior to Visit  Medication Dose Route Frequency Provider Last Rate Last Dose  . 0.9 %  sodium chloride infusion  500 mL Intravenous Continuous Gatha Mayer, MD       Allergies  Allergen Reactions  . Augmentin [Amoxicillin-Pot Clavulanate] Nausea And Vomiting    Vomiting   . Ciprofloxacin Hcl     Arm turned red  . Codeine Other (See Comments)    Makes pain worse  . Erythromycin     Rash   . Sulfur     rash   Social History   Socioeconomic History  . Marital status: Married    Spouse name: Not on file  . Number of children: Not on file  . Years of education: Not on file  . Highest education level: Not on file  Occupational History    Employer: [employer]  Social Needs  . Financial resource strain: Not on file  . Food insecurity:    Worry: Not on file    Inability: Not on file  . Transportation needs:    Medical: Not on file    Non-medical: Not on file  Tobacco Use  . Smoking status: Never Smoker  . Smokeless tobacco: Never Used  Substance and Sexual Activity  . Alcohol use: No  . Drug use: No  . Sexual activity: Yes  Lifestyle  . Physical activity:    Days per week: Not on file    Minutes per session: Not on file  . Stress: Not on file    Relationships  . Social connections:    Talks on phone: Not on file    Gets together: Not on file    Attends religious service: Not on file    Active member of club or organization: Not on file    Attends meetings of clubs or organizations: Not on file    Relationship status: Not on file  . Intimate partner violence:    Fear of current or ex partner: Not on file  Emotionally abused: Not on file    Physically abused: Not on file    Forced sexual activity: Not on file  Other Topics Concern  . 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   No caffeine      Review of Systems  Constitutional: Negative.   All other systems reviewed and are negative.      Objective:   Physical Exam  Constitutional: She appears well-developed and well-nourished. No distress.  Cardiovascular: Normal rate, regular rhythm and normal heart sounds.  Pulmonary/Chest: Effort normal and breath sounds normal. No stridor. No respiratory distress. She has no wheezes.  Musculoskeletal: She exhibits no edema.  Skin: She is not diaphoretic.  Vitals reviewed.   Lab on 05/09/2018  Component Date Value Ref Range Status  . WBC 05/09/2018 7.3  3.8 - 10.8 Thousand/uL Final  . RBC 05/09/2018 5.26* 3.80 - 5.10 Million/uL Final  . Hemoglobin 05/09/2018 15.7* 11.7 - 15.5 g/dL Final  . HCT 05/09/2018 45.6* 35.0 - 45.0 % Final  . MCV 05/09/2018 86.7  80.0 - 100.0 fL Final  . MCH 05/09/2018 29.8  27.0 - 33.0 pg Final  . MCHC 05/09/2018 34.4  32.0 - 36.0 g/dL Final  . RDW 05/09/2018 12.1  11.0 - 15.0 % Final  . Platelets 05/09/2018 349  140 - 400 Thousand/uL Final  . MPV 05/09/2018 9.4  7.5 - 12.5 fL Final  . Neutro Abs 05/09/2018 4,614  1,500 - 7,800 cells/uL Final  . Lymphs Abs 05/09/2018 2,095  850 - 3,900 cells/uL Final  . WBC mixed population 05/09/2018 496  200 - 950 cells/uL Final  . Eosinophils Absolute 05/09/2018 58  15  - 500 cells/uL Final  . Basophils Absolute 05/09/2018 37  0 - 200 cells/uL Final  . Neutrophils Relative % 05/09/2018 63.2  % Final  . Total Lymphocyte 05/09/2018 28.7  % Final  . Monocytes Relative 05/09/2018 6.8  % Final  . Eosinophils Relative 05/09/2018 0.8  % Final  . Basophils Relative 05/09/2018 0.5  % Final  . Cholesterol 05/09/2018 178  <200 mg/dL Final  . HDL 05/09/2018 38* >50 mg/dL Final  . Triglycerides 05/09/2018 154* <150 mg/dL Final  . LDL Cholesterol (Calc) 05/09/2018 113* mg/dL (calc) Final   Comment: Reference range: <100 . Desirable range <100 mg/dL for primary prevention;   <70 mg/dL for patients with CHD or diabetic patients  with > or = 2 CHD risk factors. . LDL-C is now calculated using the Martin-Hopkins  calculation, which is a validated novel method providing  better accuracy than the Friedewald equation in the  estimation of LDL-C.  Martin SS et al. JAMA. 2013;310(19): 2061-2068  (http://education.[facility].com/faq/FAQ164)   . Total CHOL/HDL Ratio 05/09/2018 4.7  <5.0 (calc) Final  . Non-HDL Cholesterol (Calc) 05/09/2018 140* <130 mg/dL (calc) Final   Comment: For patients with diabetes plus 1 major ASCVD risk  factor, treating to a non-HDL-C goal of <100 mg/dL  (LDL-C of <70 mg/dL) is considered a therapeutic  option.   . Glucose, Bld 05/09/2018 110* 65 - 99 mg/dL Final   Comment: .            Fasting reference interval . For someone without known diabetes, a glucose value between 100 and 125 mg/dL is consistent with prediabetes and should be confirmed with a follow-up test. .   . BUN 05/09/2018 11  7 - 25 mg/dL Final  .   Creat 05/09/2018 0.72  0.50 - 1.10 mg/dL Final  . BUN/Creatinine Ratio 05/09/2018 NOT APPLICABLE  6 - 22 (calc) Final  . Sodium 05/09/2018 141  135 - 146 mmol/L Final  . Potassium 05/09/2018 3.9  3.5 - 5.3 mmol/L Final  . Chloride 05/09/2018 104  98 - 110 mmol/L Final  . CO2 05/09/2018 28  20 - 32 mmol/L Final  .  Calcium 05/09/2018 9.2  8.6 - 10.2 mg/dL Final  . Total Protein 05/09/2018 6.1  6.1 - 8.1 g/dL Final  . Albumin 05/09/2018 3.8  3.6 - 5.1 g/dL Final  . Globulin 05/09/2018 2.3  1.9 - 3.7 g/dL (calc) Final  . AG Ratio 05/09/2018 1.7  1.0 - 2.5 (calc) Final  . Total Bilirubin 05/09/2018 0.4  0.2 - 1.2 mg/dL Final  . Alkaline phosphatase (APISO) 05/09/2018 58  33 - 115 U/L Final  . AST 05/09/2018 18  10 - 30 U/L Final  . ALT 05/09/2018 17  6 - 29 U/L Final  . Rubeola IgG 05/09/2018 81.70  AU/mL Final   Comment: AU/mL            Interpretation -----            -------------- <25.00           Negative 25.00-29.99      Equivocal >29.99           Positive . A positive result indicates that the patient has antibody to measles virus. It does not differentiate  between an active or past infection. The clinical  diagnosis must be interpreted in conjunction with  clinical signs and symptoms of the patient.   . Mumps IgG 05/09/2018 141.00  AU/mL Final   Comment:  AU/mL           Interpretation -------         ---------------- <9.00             Negative 9.00-10.99        Equivocal >10.99            Positive A positive result indicates that the patient has  antibody to mumps virus. It does not differentiate between an  active or past infection. The clinical diagnosis must be interpreted in conjunction with clinical signs and symptoms of the patient. .   . Rubella 05/09/2018 2.52  index Final   Comment:     Index            Interpretation     -----            --------------       <0.90            Not consistent with Immunity     0.90-0.99        Equivocal     > or = 1.00      Consistent with Immunity  . The presence of rubella IgG antibody suggests  immunization or past or current infection with rubella virus.   . Varicella IgG 05/09/2018 1,420.00  index Final   Comment:        Index               Interpretation      ---------         ----------------------     <135.00             Negative - Antibody not detected     135.00 - 164.99    Equivocal     >   or = 165.00      Positive - Antibody detected .     A positive result indicates that the patient     has antibody to VZV but does not differentiate     between an active or past infection.      The clinical diagnosis must be interpreted in      conjunction with the clinical signs and symptoms of      the patient. This assay reliably measures immunity     due to previous infection but may not be      sensitive enough to detect antibodies induced by     vaccination. Thus, a negative result in a vaccinated     individual does not necessarily indicate     susceptibility to VZV infection. A more sensitive     test for vaccination-induced immunity is Varicella     Zoster Virus Antibody Immunity Screen, ACIF.   Marland Kitchen Hepatitis B-Post 05/09/2018 <5* > OR = 10 mIU/mL Final   Comment: . Patient does not have immunity to hepatitis B virus. . For additional information, please refer to http://education.[facility].com/faq/FAQ105 (This link is being provided for informational/ educational purposes only).   . Hgb A1c MFr Bld 05/09/2018 6.0* <5.7 % of total Hgb Final   Comment: For someone without known diabetes, a hemoglobin  A1c value between 5.7% and 6.4% is consistent with prediabetes and should be confirmed with a  follow-up test. . For someone with known diabetes, a value <7% indicates that their diabetes is well controlled. A1c targets should be individualized based on duration of diabetes, age, comorbid conditions, and other considerations. . This assay result is consistent with an increased risk of diabetes. . Currently, no consensus exists regarding use of hemoglobin A1c for diagnosis of diabetes for children. .   . TEST NAME: 05/09/2018 HEMOGLOBIN A1c   Final  . TEST CODE: 05/09/2018 [id number]   Final  . CLIENT CONTACT: 05/09/2018 KIM WILDS   Final  . REPORT ALWAYS MESSAGE SIGNATURE 05/09/2018    Final    Comment: . The laboratory testing on this patient was verbally requested or confirmed by the ordering physician or his or her authorized representative after contact with an employee of [employer]. Federal regulations require that we maintain on file written authorization for all laboratory testing.  Accordingly we are asking that the ordering physician or his or her authorized representative sign a copy of this report and promptly return it to the client service representative. . . Signature:____________________________________________________ . Please fax this signed page to [phone] or return it via your [employer] courier.          Assessment & Plan:  Need for prophylactic vaccination against hepatitis A and hepatitis B in adult - Plan: Hepatitis A hepatitis B combined vaccine IM  Controlled type 2 diabetes mellitus without complication, without long-term current use of insulin ([place])  I am ecstatic about her blood sugar control.  I congratulated the patient on her changes.  Switch metformin to Glucophage extended release 500 mg twice a day.  Recheck labs in 6 months.  We discussed her cholesterol and her ten-year risk of cardiovascular disease.  Also recommended a statin due to her diabetes based on ACC guidelines.  Therefore we will begin the patient on Lipitor 20 mg a day.  Patient is also due for urine microalbumin.  She defers this today.  She states she would like to check that in 6 months.  She also defers adding an  ACE inhibitor at the present time.  She started the hepatitis B series today.  She will consider Pneumovax 23 in 6 months at her follow-up. 

## 2018-06-05 ENCOUNTER — Encounter: Payer: Self-pay | Admitting: Family Medicine

## 2018-06-12 ENCOUNTER — Encounter: Payer: Self-pay | Admitting: Family Medicine

## 2018-06-14 ENCOUNTER — Ambulatory Visit: Payer: BC Managed Care – PPO | Admitting: Family Medicine

## 2018-06-14 ENCOUNTER — Encounter: Payer: Self-pay | Admitting: Family Medicine

## 2018-06-14 VITALS — BP 120/82 | HR 101 | Temp 98.4°F | Ht 65.0 in | Wt 191.0 lb

## 2018-06-14 DIAGNOSIS — M25511 Pain in right shoulder: Secondary | ICD-10-CM

## 2018-06-14 DIAGNOSIS — G8929 Other chronic pain: Secondary | ICD-10-CM

## 2018-06-14 NOTE — Progress Notes (Signed)
Subjective:    Patient ID: Candice Jones, female    DOB: [birth year], 42 y.o.   MRN: [id number]  Medication Refill   Shoulder Pain    5 months ago, the patient was internally rotating her arm to get a bag from behind her car seat when she felt a twinge of pain in her right lateral shoulder.  Patient has had pain in her right shoulder now for the last 5 months.  She has pain with internal rotation such as to put her cell phone in her back pocket.  She has no pain with abduction greater than 90 degrees.  She has a negative empty can sign.  She has a negative Hawkins sign.  She does have pain with internal rotation however she has no pain with resisted internal rotation.  She has no pain with resisted external rotation.  She has negative Spurling sign.  Reflexes, normal strength, and normal sensation Past Medical History:  Diagnosis Date  . Adenomyosis   . Asthma   . Gall stones   . Gestational diabetes mellitus   . Hyperlipidemia   . IBS (irritable bowel syndrome)   . Kidney stones   . Meniere's syndrome   . Vertigo    Past Surgical History:  Procedure Laterality Date  . CESAREAN SECTION  08/1999, 05/2003  . CHOLECYSTECTOMY  2011  . CYSTECTOMY  1997   left wrist   . ENDOMETRIAL ABLATION  01/2010  . HEMORRHOID BANDING  2015  . MINOR HEMORRHOIDECTOMY  04/2014   thrombosed  . WISDOM TOOTH EXTRACTION  1996/1997   Current Outpatient Medications on File Prior to Visit  Medication Sig Dispense Refill  . Blood Glucose Monitoring Suppl KIT Use to check fasting glucose daily ICD10: R73.01.Dispense based on insurance preference 1 each 0  . Lancets (FREESTYLE) lancets Use to check blood sugar ICD10: R73.01.Dispense based on insurance preference 100 each 12  . levonorgestrel-ethinyl estradiol (SEASONALE,INTROVALE,JOLESSA) 0.15-0.03 MG tablet Take 1 tablet by mouth daily.    Marland Kitchen loratadine (CLARITIN) 10 MG tablet Take 10 mg by mouth daily.    . metFORMIN (GLUCOPHAGE XR) 500 MG 24 hr tablet Take 2  tablets (1,000 mg total) by mouth daily with breakfast. 30 tablet 11  . triamterene-hydrochlorothiazide (DYAZIDE) 37.5-25 MG per capsule Take 1 capsule by mouth daily. For meniere's dz  5  . fluticasone (FLONASE) 50 MCG/ACT nasal spray Place 1 spray into both nostrils daily.    Marland Kitchen glucose blood test strip Use with glucose meter to check blood sugar.ICD10: R73.01.Dispense based on insurance preference (Patient not taking: Reported on 06/14/2018) 100 each 12   Current Facility-Administered Medications on File Prior to Visit  Medication Dose Route Frequency Provider Last Rate Last Dose  . 0.9 %  sodium chloride infusion  500 mL Intravenous Continuous Gatha Mayer, MD       Allergies  Allergen Reactions  . Augmentin [Amoxicillin-Pot Clavulanate] Nausea And Vomiting    Vomiting   . Ciprofloxacin Hcl     Arm turned red  . Codeine Other (See Comments)    Makes pain worse  . Erythromycin     Rash   . Sulfur     rash   Social History   Socioeconomic History  . Marital status: Married    Spouse name: Not on file  . Number of children: Not on file  . Years of education: Not on file  . Highest education level: Not on file  Occupational History    Employer: [employer]  Social Needs  . Financial resource strain: Not on file  . Food insecurity:    Worry: Not on file    Inability: Not on file  . Transportation needs:    Medical: Not on file    Non-medical: Not on file  Tobacco Use  . Smoking status: Never Smoker  . Smokeless tobacco: Never Used  Substance and Sexual Activity  . Alcohol use: No  . Drug use: No  . Sexual activity: Yes  Lifestyle  . Physical activity:    Days per week: Not on file    Minutes per session: Not on file  . Stress: Not on file  Relationships  . Social connections:    Talks on phone: Not on file    Gets together: Not on file    Attends religious service: Not on file    Active member of club or organization: Not on file    Attends  meetings of clubs or organizations: Not on file    Relationship status: Not on file  . Intimate partner violence:    Fear of current or ex partner: Not on file    Emotionally abused: Not on file    Physically abused: Not on file    Forced sexual activity: Not on file  Other Topics Concern  . 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   No caffeine      Review of Systems  Constitutional: Negative.   All other systems reviewed and are negative.      Objective:   Physical Exam  Constitutional: She appears well-developed and well-nourished. No distress.  Cardiovascular: Normal rate, regular rhythm and normal heart sounds.  Pulmonary/Chest: Effort normal and breath sounds normal. No stridor. No respiratory distress. She has no wheezes.  Musculoskeletal:       Right shoulder: She exhibits decreased range of motion and pain. She exhibits no tenderness, no bony tenderness, no spasm and normal strength.  Skin: She is not diaphoretic.  Vitals reviewed.        Assessment & Plan:  Chronic right shoulder pain - Plan: Ambulatory referral to Physical Therapy  I suspect the patient has tendinitis in her infraspinatus muscle/rotator cuff given the fact that stretching with internal rotation exacerbates the pain.  I recommended physical therapy.  I see no evidence of a rotator cuff tear.

## 2018-06-22 ENCOUNTER — Encounter: Payer: Self-pay | Admitting: Family Medicine

## 2018-06-22 MED ORDER — FLUCONAZOLE 150 MG PO TABS: 150.0000 mg | ORAL_TABLET | Freq: Once | ORAL | 0 refills | Status: AC

## 2018-06-27 ENCOUNTER — Encounter: Payer: Self-pay | Admitting: Physical Therapy

## 2018-06-27 ENCOUNTER — Ambulatory Visit: Payer: BC Managed Care – PPO | Attending: Family Medicine | Admitting: Physical Therapy

## 2018-06-27 DIAGNOSIS — M25611 Stiffness of right shoulder, not elsewhere classified: Secondary | ICD-10-CM

## 2018-06-27 DIAGNOSIS — M6281 Muscle weakness (generalized): Secondary | ICD-10-CM | POA: Diagnosis present

## 2018-06-27 DIAGNOSIS — G8929 Other chronic pain: Secondary | ICD-10-CM | POA: Diagnosis present

## 2018-06-27 DIAGNOSIS — M25511 Pain in right shoulder: Secondary | ICD-10-CM | POA: Insufficient documentation

## 2018-06-27 NOTE — Therapy (Signed)
[facility] [address] [place], [place], [address] Phone: [phone]   Fax:  [phone]  Physical Therapy Evaluation  Patient Details  Name: Candice Jones MRN: [id number] Date of Birth: 07/26/1976 Referring Provider (PT): Lynnea FerrierWarren Pickard, MD   Encounter Date: 06/27/2018  PT End of Session - 06/27/18 1035    Visit Number  1    Number of Visits  13    PT Start Time  0845    PT Stop Time  0932    PT Time Calculation (min)  47 min    Activity Tolerance  Patient tolerated treatment well    Behavior During Therapy  [facility] for tasks assessed/performed       Past Medical History:  Diagnosis Date  . Adenomyosis   . Asthma   . Gall stones   . Gestational diabetes mellitus   . Hyperlipidemia   . IBS (irritable bowel syndrome)   . Kidney stones   . Meniere's syndrome   . Vertigo     Past Surgical History:  Procedure Laterality Date  . CESAREAN SECTION  08/1999, 05/2003  . CHOLECYSTECTOMY  2011  . CYSTECTOMY  1997   left wrist   . ENDOMETRIAL ABLATION  01/2010  . HEMORRHOID BANDING  2015  . MINOR HEMORRHOIDECTOMY  04/2014   thrombosed  . WISDOM TOOTH EXTRACTION  1996/1997    There were no vitals filed for this visit.   Subjective Assessment - 06/27/18 0851    Subjective  Pt reporting back in June 2019 pt was reaching back in her car to get something out of her bag and she felt a painful sensation. Pt reporting it has been getting worse. Pt reporting that over the counter meds helped for a while but don't seem to be helping anymore.      Limitations  Lifting    How long can you sit comfortably?  unlimited    How long can you stand comfortably?  unlimited    How long can you walk comfortably?  unlimited    Patient Stated Goals  I want my shoulder to stop hurting so I can work and sleep without pain    Currently in Pain?  Yes    Pain Score  3    6/10 pain with reaching back   Pain Orientation  Right    Pain Descriptors /  Indicators  Aching;Discomfort    Pain Type  Chronic pain    Pain Onset  More than a month ago    Aggravating Factors   lifting things, reaching behind my back, reaching out to side    Pain Relieving Factors  resting    Effect of Pain on Daily Activities  unable to lye on my back without pain         [facility] - 06/27/18 0001      Assessment   Medical Diagnosis  R shoulder pain    Referring Provider (PT)  Lynnea FerrierWarren Pickard, MD    Onset Date/Surgical Date  01/01/18    Hand Dominance  Right    Next MD Visit  January 2020    Prior Therapy  no      Precautions   Precautions  None      Restrictions   Weight Bearing Restrictions  No      Balance Screen   Has the patient fallen in the past 6 months  No    Is the patient reluctant to leave their home because of a  fear of falling?   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    Leisure  reading      Cognition   Overall Cognitive Status  Within Functional Limits for tasks assessed      Observation/Other Assessments   Focus on Therapeutic Outcomes (FOTO)   42% limitation      Posture/Postural Control   Posture/Postural Control  Postural limitations    Postural Limitations  Rounded Shoulders;Forward head;Posterior pelvic tilt      ROM / Strength   AROM / PROM / Strength  AROM;Strength      AROM   AROM Assessment Site  Shoulder    Right/Left Shoulder  Right;Left    Right Shoulder Flexion  152 Degrees    Right Shoulder ABduction  120 Degrees   with pain   Right Shoulder Internal Rotation  --   thumb to L3   Right Shoulder External Rotation  74 Degrees   with pain   Left Shoulder Flexion  168 Degrees    Left Shoulder ABduction  155 Degrees    Left Shoulder External Rotation  78  Degrees    Left Shoulder Horizontal ABduction  --   thumb to T6     Strength   Strength Assessment Site  Shoulder    Right/Left Shoulder  Right;Left    Right Shoulder Flexion  3-/5    Right Shoulder Extension  3-/5    Right Shoulder Internal Rotation  3-/5    Right Shoulder External Rotation  3-/5    Left Shoulder Flexion  5/5    Left Shoulder Extension  4+/5    Left Shoulder Internal Rotation  4+/5    Left Shoulder External Rotation  4+/5      Palpation   Palpation comment  active trigger points noted in bilateral levator musculature      Special Tests    Special Tests  Rotator Cuff Impingement    Rotator Cuff Impingment tests  Empty Can test;Full Can test      Empty Can test   Findings  Positive    Side  Right      Full Can test   Findings  Positive    Side  Right      Ambulation/Gait   Gait Comments  Pt amb with decresed arm swing on the R UE                Objective measurements completed on examination: See above findings.              PT Education - 06/27/18 1034    Education Details  HEP and posture correction    Person(s) Educated  Patient    Methods  Explanation;Demonstration;Verbal cues;Handout    Comprehension  Verbalized understanding;Returned demonstration;Need further instruction          PT Long Term Goals - 06/27/18 1042      PT LONG TERM GOAL #1   Title  Pt will be independent in her HEP and progression.     Baseline  issued initial program at eval    Time  6    Period  Weeks    Status  New  Target Date  08/08/18      PT LONG TERM GOAL #2   Title  Pt will improve her FOTO from 42 %limitation to  32 %limitation.     Baseline  42 % limitation at eval 0n 06/27/18    Time  6    Period  Weeks    Status  New    Target Date  08/08/18      PT LONG TERM GOAL #3   Title  Pt will be able to fasten her bra with pain </= 2/10.     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     Time  6    Period  Weeks    Status  New    Target Date  08/08/18      PT LONG TERM GOAL #5   Title  Pt will improve her strength in R UE to grossly >/= 4+/5 in order to improve functional mobility.     Baseline  -3/5 with pain    Time  6    Period  Weeks    Status  New    Target Date  08/08/18             Plan - 06/27/18 1036    Clinical Impression Statement  Pt presenting today for PT evaluation of R shoulder pain which has been going on since reaching back in her car in June 2019. Pt reporting no pain at rest, 3/10 pain with movments and 6/10 pain when reaching behind her back to fasten her bra. Pt with positive empty can test and positive full can test. Pt with painful ER, IR and Abduction of R shoulder. Active trigger points noted in bilateral levator scapular muscluature. Pt reporting difficutly sleeping and lying on her back without pain. Pt was instructed in beginning HEP and self soft tissue mobilization using a tennis ball. Pt also presenting with limited ROM compared to left UE and pt with decreased strength in R UE to grossly 3/5. Skilled PT needed to progress pt toward her PLOF.     Clinical Presentation  Stable    Clinical Decision Making  Low    Rehab Potential  Excellent    PT Frequency  2x / week    PT Duration  6 weeks    PT Treatment/Interventions  ADLs/Self Care Home Management;Cryotherapy;Electrical Stimulation;Iontophoresis 4mg /ml Dexamethasone;Moist Heat;Ultrasound;Therapeutic exercise;Therapeutic activities;Functional mobility training;Patient/family education;Manual techniques;Passive range of motion;Dry needling;Taping    PT Next Visit Plan  shoulder ROM, strengthening, postural exercises, cervical ROM and stretching     PT Home Exercise Plan  AAROM shoulder flexion and ER, IR stretching with towel     Consulted and Agree with Plan of  Care  Patient       Patient will benefit from skilled therapeutic intervention in order to improve the following deficits and impairments:  Pain, Postural dysfunction, Decreased activity tolerance, Decreased range of motion, Decreased strength, Impaired UE functional use  Visit Diagnosis: Chronic right shoulder pain  Muscle weakness (generalized)  Stiffness of right shoulder, not elsewhere classified     Problem List Patient Active Problem List   Diagnosis Date Noted  . Family history of colon cancer - sister < 81  02/02/2017  . Internal hemorrhoids with bleeding and prolapse 06/24/2014  . IBS (irritable bowel syndrome) 06/24/2014  . Endometriosis 03/03/2013  . Elevated fasting glucose 03/01/2013  . Gallstones 05/12/2011    Sharmon Leyden, PT 06/27/2018, 10:56 AM  [facility] [address] [place], [place], [address] Phone: [phone]   Fax:  [phone]  Name: Candice Jones MRN: [id number] Date of Birth: Mar 15, 1976

## 2018-07-10 ENCOUNTER — Encounter: Payer: Self-pay | Admitting: Physical Therapy

## 2018-07-10 ENCOUNTER — Ambulatory Visit: Payer: BC Managed Care – PPO | Attending: Family Medicine | Admitting: Physical Therapy

## 2018-07-10 DIAGNOSIS — M25511 Pain in right shoulder: Secondary | ICD-10-CM | POA: Insufficient documentation

## 2018-07-10 DIAGNOSIS — M6281 Muscle weakness (generalized): Secondary | ICD-10-CM | POA: Insufficient documentation

## 2018-07-10 DIAGNOSIS — M25611 Stiffness of right shoulder, not elsewhere classified: Secondary | ICD-10-CM | POA: Insufficient documentation

## 2018-07-10 DIAGNOSIS — G8929 Other chronic pain: Secondary | ICD-10-CM | POA: Diagnosis present

## 2018-07-10 NOTE — Therapy (Signed)
[facility] [address] [place], [place], [address] Phone: [phone]   Fax:  [phone]  Physical Therapy Treatment  Patient Details  Name: Candice Jones MRN: [id number] Date of Birth: 06/27/1976 Referring Provider (PT): Lynnea FerrierWarren Pickard, MD   Encounter Date: 07/10/2018  PT End of Session - 07/10/18 1547    Visit Number  2    Number of Visits  13    PT Start Time  0345    PT Stop Time  0423    PT Time Calculation (min)  38 min       Past Medical History:  Diagnosis Date  . Adenomyosis   . Asthma   . Gall stones   . Gestational diabetes mellitus   . Hyperlipidemia   . IBS (irritable bowel syndrome)   . Kidney stones   . Meniere's syndrome   . Vertigo     Past Surgical History:  Procedure Laterality Date  . CESAREAN SECTION  08/1999, 05/2003  . CHOLECYSTECTOMY  2011  . CYSTECTOMY  1997   left wrist   . ENDOMETRIAL ABLATION  01/2010  . HEMORRHOID BANDING  2015  . MINOR HEMORRHOIDECTOMY  04/2014   thrombosed  . WISDOM TOOTH EXTRACTION  1996/1997    There were no vitals filed for this visit.  Subjective Assessment - 07/10/18 1545    Subjective  Was doing better. Driving may have aggravated the shoulder. Better than it was.     Currently in Pain?  No/denies                       [facility]/Exercise - 07/10/18 0001      Exercises   Exercises  Shoulder      Shoulder Exercises: Supine   Horizontal ABduction  15 reps;Theraband    Theraband Level (Shoulder Horizontal ABduction)  Level 2 (Red)    Other Supine Exercises  supine cane flexion , ER       Shoulder Exercises: Standing   External Rotation  20 reps    Theraband Level (Shoulder External Rotation)  Level 2 (Red)    Internal Rotation  20 reps;Theraband    Theraband Level (Shoulder Internal Rotation)  Level 2 (Red)    Row Limitations  pain    Retraction  10 reps    Retraction Limitations  scap sets 5 seconds       Shoulder  Exercises: Pulleys   Flexion  2 minutes    Scaption  1 minute      Shoulder Exercises: Stretch   Corner Stretch  3 reps;20 seconds    Internal Rotation Stretch  20 seconds    Wall Stretch - Flexion  5 reps    Wall Stretch - ABduction  5 reps             PT Education - 07/10/18 1633    Education Details  HEP    Person(s) Educated  Patient    Methods  Explanation;Handout    Comprehension  Verbalized understanding          PT Long Term Goals - 06/27/18 1042      PT LONG TERM GOAL #1   Title  Pt will be independent in her HEP and progression.     Baseline  issued initial program at eval    Time  6    Period  Weeks    Status  New    Target Date  08/08/18  PT LONG TERM GOAL #2   Title  Pt will improve her FOTO from 42 %limitation to  32 %limitation.     Baseline  42 % limitation at eval 0n 06/27/18    Time  6    Period  Weeks    Status  New    Target Date  08/08/18      PT LONG TERM GOAL #3   Title  Pt will be able to fasten her bra with pain </= 2/10.     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     Time  6    Period  Weeks    Status  New    Target Date  08/08/18      PT LONG TERM GOAL #5   Title  Pt will improve her strength in R UE to grossly >/= 4+/5 in order to improve functional mobility.     Baseline  -3/5 with pain    Time  6    Period  Weeks    Status  New    Target Date  08/08/18            Plan - 07/10/18 1547    Clinical Impression Statement  Pt reports she is able to place her phone in her back pocket now without pain. Increased pain with shoulder rolls, better with scap setting. Able to begin gentle GHJ strengthening with cues to keep pain free ROM.     PT Next Visit Plan  shoulder ROM, strengthening, postural exercises, cervical ROM and stretching     PT Home  Exercise Plan  AAROM shoulder flexion and ER, IR stretching with towel , shoulder IR red band, ER red band    Consulted and Agree with Plan of Care  Patient       Patient will benefit from skilled therapeutic intervention in order to improve the following deficits and impairments:  Pain, Postural dysfunction, Decreased activity tolerance, Decreased range of motion, Decreased strength, Impaired UE functional use  Visit Diagnosis: Chronic right shoulder pain  Muscle weakness (generalized)  Stiffness of right shoulder, not elsewhere classified     Problem List Patient Active Problem List   Diagnosis Date Noted  . Family history of colon cancer - sister < 60 02/02/2017  . Internal hemorrhoids with bleeding and prolapse 06/24/2014  . IBS (irritable bowel syndrome) 06/24/2014  . Endometriosis 03/03/2013  . Elevated fasting glucose 03/01/2013  . Gallstones 05/12/2011    Candice Jones, PTA 07/10/2018, 4:34 PM  [facility] [address] [place], [place], [address] Phone: [phone]   Fax:  [phone]  Name: Candice Jones MRN: [id number] Date of Birth: 10/16/1975

## 2018-07-13 ENCOUNTER — Encounter

## 2018-07-16 ENCOUNTER — Encounter: Payer: Self-pay | Admitting: Physical Therapy

## 2018-07-16 ENCOUNTER — Ambulatory Visit: Payer: BC Managed Care – PPO | Admitting: Physical Therapy

## 2018-07-16 DIAGNOSIS — M25511 Pain in right shoulder: Principal | ICD-10-CM

## 2018-07-16 DIAGNOSIS — M25611 Stiffness of right shoulder, not elsewhere classified: Secondary | ICD-10-CM

## 2018-07-16 DIAGNOSIS — M6281 Muscle weakness (generalized): Secondary | ICD-10-CM

## 2018-07-16 DIAGNOSIS — G8929 Other chronic pain: Secondary | ICD-10-CM

## 2018-07-16 NOTE — Therapy (Signed)
[place], [place], [address] Phone: [phone]   Fax:  [phone]  Physical Therapy Treatment  Patient Details  Name: Candice Jones MRN: [id number] Date of Birth: 07-29-1976 Referring Provider (PT): Jenna Luo, MD   Encounter Date: 07/16/2018  PT End of Session - 07/16/18 1725    Visit Number  3    Number of Visits  13    PT Start Time  4944    PT Stop Time  1727    PT Time Calculation (min)  53 min    Activity Tolerance  Patient tolerated treatment well    Behavior During Therapy  [facility] for tasks assessed/performed       Past Medical History:  Diagnosis Date  . Adenomyosis   . Asthma   . Gall stones   . Gestational diabetes mellitus   . Hyperlipidemia   . IBS (irritable bowel syndrome)   . Kidney stones   . Meniere's syndrome   . Vertigo     Past Surgical History:  Procedure Laterality Date  . CESAREAN SECTION  08/1999, 05/2003  . CHOLECYSTECTOMY  2011  . CYSTECTOMY  1997   left wrist   . ENDOMETRIAL ABLATION  01/2010  . HEMORRHOID BANDING  2015  . MINOR HEMORRHOIDECTOMY  04/2014   thrombosed  . WISDOM TOOTH EXTRACTION  1996/1997    There were no vitals filed for this visit.  Subjective Assessment - 07/16/18 1636    Subjective  has been doing the exercises.   Shoulder is a lot better,  she can put  her phone  in back pocket,  thumb can now touch the bra srrap.      Currently in Pain?  Yes    Pain Score  --   up to 3-4/10   Pain Location  Shoulder    Pain Orientation  Right    Pain Descriptors / Indicators  Aching    Aggravating Factors   cold weather,  reaching behind my back    Pain Relieving Factors  resting,   Exercise    Effect of Pain on Daily Activities  limits sleeping on back,  uses left a lot.                         [facility] Adult PT Treatment/Exercise - 07/16/18 0001      Self-Care   Self-Care  --   anatomy,  model used to show impingement.  Posture  important     Shoulder Exercises: Supine   Horizontal ABduction  10 reps    Theraband Level (Shoulder Horizontal ABduction)  Level 2 (Red)    Flexion  10 reps    Flexion Limitations  narrow grip  red     Other Supine Exercises  sash  red band  painful posterior shoulder  Teres area so stopped.  AROM  X 5   cued  able to do without pain    Other Supine Exercises  head press, X 5 seconds,  5 reps,  shpoulder press % XZ 5 seconds   supine with wedge and 1 pillow and bolster for legs.        Shoulder Exercises: Standing   External Rotation  10 reps    Theraband Level (Shoulder External Rotation)  Level 2 (Red)    External Rotation Limitations  rolled towel at rib/ elbow helped decrease pain.     Internal Rotation  10 reps    Theraband  Level (Shoulder Internal Rotation)  Level 2 (Red)   with rolled towel.       Shoulder Exercises: Stretch   Other Shoulder Stretches  Self mobs ,  IR stretch with rolled towel   10 X with cues to increase IR reach able to reach above bra line.        Cryotherapy   Number Minutes Cryotherapy  10 Minutes    Cryotherapy Location  Shoulder   posterior   Type of Cryotherapy  --   cold pack     Manual Therapy   Manual Therapy  Soft tissue mobilization    Manual therapy comments  NEUROMUSCULAR TRIGGER POINT RELEASE  TERES POSTERIOR SHOULDER,  GENTLE DISTRACTION  TISSUE SOFTENED.               PT Education - 07/16/18 1724    Education Details  exercise form  ,, anatomy  impingement  info    Person(s) Educated  Patient    Methods  Explanation;Demonstration;Tactile cues;Verbal cues    Comprehension  Verbalized understanding;Returned demonstration          PT Long Term Goals - 07/16/18 1733      PT LONG TERM GOAL #1   Title  Pt will be independent in her HEP and progression.     Baseline  minor cues    Time  6    Period  Weeks    Status  On-going      PT LONG TERM GOAL #2   Title  Pt will improve her FOTO from 42 %limitation to  32  %limitation.     Time  6    Period  Weeks    Status  Unable to assess      PT LONG TERM GOAL #3   Title  Pt will be able to fasten her bra with pain </= 2/10.     Baseline  able to do this morning.  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     Baseline  pain varies    Time  6    Period  Weeks    Status  On-going      PT LONG TERM GOAL #5   Title  Pt will improve her strength in R UE to grossly >/= 4+/5 in order to improve functional mobility.     Time  6    Period  Weeks    Status  Unable to assess            Plan - 07/16/18 1731    Clinical Impression Statement  ROM IR able to reach beyond bra lain.  She was able to hook bra this morning. LTG#3 partially met. Exercise focus today.  She needs to cancel visit on the 23rd.     PT Next Visit Plan  shoulder ROM, strengthening, postural exercises, cervical ROM and stretching ,  consider prone exercises.     PT Home Exercise Plan  AAROM shoulder flexion and ER, IR stretching with towel , shoulder IR red band, ER red band    Consulted and Agree with Plan of Care  Patient       Patient will benefit from skilled therapeutic intervention in order to  improve the following deficits and impairments:     Visit Diagnosis: Chronic right shoulder pain  Stiffness of right shoulder, not elsewhere classified  Muscle weakness (generalized)     Problem List Patient Active Problem List   Diagnosis Date Noted  . Family history of colon cancer - sister < 60 02/02/2017  . Internal hemorrhoids with bleeding and prolapse 06/24/2014  . IBS (irritable bowel syndrome) 06/24/2014  . Endometriosis 03/03/2013  . Elevated fasting glucose 03/01/2013  . Gallstones 05/12/2011    HARRIS,KAREN PTA 07/16/2018, 5:37 PM  [facility] [address] [place], [place], [address] Phone: [phone]   Fax:  [phone]  Name: Candice Jones MRN: [id number] Date of Birth: Jan 12, 1976

## 2018-07-18 ENCOUNTER — Ambulatory Visit: Payer: BC Managed Care – PPO | Admitting: Physical Therapy

## 2018-07-18 ENCOUNTER — Encounter: Payer: Self-pay | Admitting: Physical Therapy

## 2018-07-18 DIAGNOSIS — M25611 Stiffness of right shoulder, not elsewhere classified: Secondary | ICD-10-CM

## 2018-07-18 DIAGNOSIS — M6281 Muscle weakness (generalized): Secondary | ICD-10-CM

## 2018-07-18 DIAGNOSIS — M25511 Pain in right shoulder: Secondary | ICD-10-CM | POA: Diagnosis not present

## 2018-07-18 DIAGNOSIS — G8929 Other chronic pain: Secondary | ICD-10-CM

## 2018-07-18 NOTE — Patient Instructions (Signed)
Decompression exercises added to HEP from exercise drawer  All issued  5 x 5 seconds each except 1st exercise which is 5 to 15 minutes hold.  Daily as needed for posture correction

## 2018-07-18 NOTE — Therapy (Addendum)
[place], [place], [address] Phone: [phone]   Fax:  [phone]  Physical Therapy Treatment Discharge  Patient Details  Name: Candice Jones MRN: [id number] Date of Birth: [birth year] Referring Provider (PT): Jenna Luo, MD   Encounter Date: 07/18/2018  PT End of Session - 07/18/18 1729    Visit Number  4    Number of Visits  13    PT Start Time  1630    PT Stop Time  8338    PT Time Calculation (min)  45 min    Activity Tolerance  Patient tolerated treatment well    Behavior During Therapy  [facility] for tasks assessed/performed       Past Medical History:  Diagnosis Date  . Adenomyosis   . Asthma   . Gall stones   . Gestational diabetes mellitus   . Hyperlipidemia   . IBS (irritable bowel syndrome)   . Kidney stones   . Meniere's syndrome   . Vertigo     Past Surgical History:  Procedure Laterality Date  . CESAREAN SECTION  08/1999, 05/2003  . CHOLECYSTECTOMY  2011  . CYSTECTOMY  1997   left wrist   . ENDOMETRIAL ABLATION  01/2010  . HEMORRHOID BANDING  2015  . MINOR HEMORRHOIDECTOMY  04/2014   thrombosed  . WISDOM TOOTH EXTRACTION  1996/1997    There were no vitals filed for this visit.  Subjective Assessment - 07/18/18 1644    Subjective  No pain since last visit.  Trigger point release really helped a lot.  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  No/denies    Pain Score  0-No pain    Pain Location  Shoulder    Pain Orientation  Right;Anterior    Pain Descriptors / Indicators  Sore;Tightness    Pain Type  Chronic pain    Aggravating Factors   flat on bed anterior ahoulder biceps  area    Pain Relieving Factors  PT manual                       [facility] Adult PT Treatment/Exercise - 07/18/18 0001      Shoulder Exercises: Supine   Horizontal ABduction  10 reps    Theraband Level (Shoulder Horizontal  ABduction)  Level 3 (Green)    Flexion  10 reps    Theraband Level (Shoulder Flexion)  Level 2 (Red)    Flexion Limitations  narrow grip  red    shakey   Other Supine Exercises  sash  red band  painful posterior shoulder  Teres area so stopped.  AROM  X 5   cued  able to do without pain    Other Supine Exercises  shoulder press 5 X,  head press X 5,  leg lengthener X 5 and leg press X 5,   % seconds hold each.  1 pillow needed under right LEG for comfort.  HEP posture improved with decreased thoracic curve visable.      Shoulder Exercises: Stretch   Corner Stretch  3 reps;20 seconds    Corner Stretch Limitations  in doorway.  Able to do without pain             PT Education - 07/18/18 1723    Education Details  HEP,  How to progress exercises.    Person(s) Educated  Patient  Methods  Explanation;Demonstration;Verbal cues;Tactile cues;Handout    Comprehension  Returned demonstration;Verbalized understanding          PT Long Term Goals - 07/18/18 1731      PT LONG TERM GOAL #1   Title  Pt will be independent in her HEP and progression.     Baseline  independent with exercises issued so far    Time  6    Period  Weeks    Status  On-going      PT LONG TERM GOAL #2   Title  Pt will improve her FOTO from 42 %limitation to  32 %limitation.     Time  6    Period  Weeks    Status  Unable to assess      PT LONG TERM GOAL #3   Title  Pt will be able to fasten her bra with pain </= 2/10.     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     Baseline  No pain with ADL at work.    Time  6    Period  Weeks    Status  Achieved      PT LONG TERM GOAL #5   Title  Pt will improve her strength in R UE to grossly >/= 4+/5 in order to improve functional mobility.     Baseline  addressed  with exercise    Time  6    Period  Weeks    Status  Unable to assess            Plan - 07/18/18 1729    Clinical Impression Statement  Patient has met all but strength goals.  HEP was progressed for strength and posture correction.  She will cancel next appointment and work on HEP to build strength and will return for the last appointment.  She is pleased with the progress.     PT Next Visit Plan  shoulder ROM, strengthening, postural exercises, cervical ROM and stretching ,  consider prone exercises.     PT Home Exercise Plan  AAROM shoulder flexion and ER, IR stretching with towel , shoulder IR red band, ER red band/ green band.  decompression series.     Consulted and Agree with Plan of Care  Patient       Patient will benefit from skilled therapeutic intervention in order to improve the following deficits and impairments:     Visit Diagnosis: Chronic right shoulder pain  Stiffness of right shoulder, not elsewhere classified  Muscle weakness (generalized)     Problem List Patient Active Problem List   Diagnosis Date Noted  . Family history of colon cancer - sister < 60 02/02/2017  . Internal hemorrhoids with bleeding and prolapse 06/24/2014  . IBS (irritable bowel syndrome) 06/24/2014  . Endometriosis 03/03/2013  . Elevated fasting glucose 03/01/2013  . Gallstones 05/12/2011   Kearney Hard, PT 08/22/18 8:39 AM   PHYSICAL THERAPY DISCHARGE SUMMARY  Visits from Start of Care: 4  Current functional level related to goals / functional outcomes: See above, strength goals met   Remaining deficits: See notes   Education / Equipment: HEP  Plan: Patient agrees to  discharge.  Patient goals were partially met. Patient is being discharged due to being pleased with the current functional level.  ?????      Tria Noguera  PTA 07/18/2018, 5:33 PM  [facility] [address] [place], [place], [address] Phone:  [phone]   Fax:  [phone]  Name: Candice Jones MRN: [id number] Date of Birth: 07-Oct-1975

## 2018-07-23 ENCOUNTER — Encounter: Payer: BC Managed Care – PPO | Admitting: Physical Therapy

## 2018-07-26 ENCOUNTER — Ambulatory Visit: Payer: BC Managed Care – PPO | Admitting: Physical Therapy

## 2018-07-30 ENCOUNTER — Ambulatory Visit: Payer: BC Managed Care – PPO

## 2018-08-24 ENCOUNTER — Telehealth: Payer: Self-pay | Admitting: Family Medicine

## 2018-08-24 ENCOUNTER — Ambulatory Visit: Payer: BC Managed Care – PPO | Admitting: Family Medicine

## 2018-08-24 ENCOUNTER — Encounter: Payer: Self-pay | Admitting: Family Medicine

## 2018-08-24 VITALS — BP 120/78 | HR 98 | Temp 98.0°F | Resp 16 | Ht 65.0 in | Wt 187.4 lb

## 2018-08-24 DIAGNOSIS — Z20828 Contact with and (suspected) exposure to other viral communicable diseases: Secondary | ICD-10-CM | POA: Diagnosis not present

## 2018-08-24 DIAGNOSIS — J209 Acute bronchitis, unspecified: Secondary | ICD-10-CM | POA: Diagnosis not present

## 2018-08-24 DIAGNOSIS — J0100 Acute maxillary sinusitis, unspecified: Secondary | ICD-10-CM | POA: Diagnosis not present

## 2018-08-24 LAB — INFLUENZA A AND B AG, IMMUNOASSAY
INFLUENZA A ANTIGEN: NOT DETECTED
INFLUENZA B ANTIGEN: NOT DETECTED

## 2018-08-24 MED ORDER — PREDNISONE 20 MG PO TABS: 40.0000 mg | ORAL_TABLET | Freq: Every day | ORAL | 0 refills | Status: AC

## 2018-08-24 MED ORDER — FLUCONAZOLE 150 MG PO TABS: 150.0000 mg | ORAL_TABLET | ORAL | 0 refills | Status: DC | PRN

## 2018-08-24 MED ORDER — DOXYCYCLINE MONOHYDRATE 100 MG PO CAPS: 100.0000 mg | ORAL_CAPSULE | Freq: Two times a day (BID) | ORAL | 0 refills | Status: AC

## 2018-08-24 MED ORDER — ALBUTEROL SULFATE HFA 108 (90 BASE) MCG/ACT IN AERS: 2.0000 | INHALATION_SPRAY | RESPIRATORY_TRACT | 0 refills | Status: DC | PRN

## 2018-08-24 NOTE — Progress Notes (Signed)
Patient ID: UNDRA TREMBATH, female    DOB: [birth year], 43 y.o.   MRN: [id number]  PCP: Susy Frizzle, MD  Chief Complaint  Patient presents with  . URI    Has c/o nasal congestion, cough. Onset 1 week.    Subjective:   Francessca JALAN BODI is a 43 y.o. female, presents to clinic with CC of: URI   This is a new problem. The current episode started 1 to 4 weeks ago (possibly started 2 weeks ago). The problem has been gradually worsening (double worsening acutely worse 1 week). There has been no fever (99.5). Associated symptoms include congestion, coughing, headaches, a plugged ear sensation, rhinorrhea, sinus pain, sneezing, a sore throat and wheezing. Pertinent negatives include no abdominal pain, chest pain, diarrhea, ear pain, joint pain, joint swelling, neck pain, rash or vomiting.  Cough  This is a new problem. The current episode started in the past 7 days. The problem has been gradually worsening. The cough is productive of sputum (productive of sputum in am, and the rest of the day dry). Associated symptoms include chills, a fever (low grade "99 but high for me"), headaches, myalgias, nasal congestion, postnasal drip, rhinorrhea, a sore throat, shortness of breath (little bit - tight in chest), sweats and wheezing. Pertinent negatives include no chest pain, ear congestion, ear pain, heartburn, hemoptysis, rash or weight loss. There is no history of COPD, emphysema or pneumonia.  She teaches in elementary school - she wants to be tested for flu - had 8 kids out sick today.    PMHx of asthma, bronchitis, sinusitis and deviated septum.  No smoking hx    Patient Active Problem List   Diagnosis Date Noted  . Family history of colon cancer - sister < 60 02/02/2017  . Internal hemorrhoids with bleeding and prolapse 06/24/2014  . IBS (irritable bowel syndrome) 06/24/2014  . Endometriosis 03/03/2013  . Elevated fasting glucose 03/01/2013  . Gallstones 05/12/2011     Prior to Admission  medications   Medication Sig Start Date End Date Taking? Authorizing Provider  Blood Glucose Monitoring Suppl KIT Use to check fasting glucose daily ICD10: R73.01.Dispense based on insurance preference 02/09/18  Yes Jenna Luo T, MD  glucose blood test strip Use with glucose meter to check blood sugar.ICD10: R73.01.Dispense based on insurance preference 02/09/18  Yes Susy Frizzle, MD  Lancets (FREESTYLE) lancets Use to check blood sugar ICD10: R73.01.Dispense based on insurance preference 02/09/18  Yes Susy Frizzle, MD  levonorgestrel-ethinyl estradiol (SEASONALE,INTROVALE,JOLESSA) 0.15-0.03 MG tablet Take 1 tablet by mouth daily.   Yes [provider]  loratadine (CLARITIN) 10 MG tablet Take 10 mg by mouth daily.   Yes [provider]  metFORMIN (GLUCOPHAGE XR) 500 MG 24 hr tablet Take 2 tablets (1,000 mg total) by mouth daily with breakfast. 05/14/18  Yes Susy Frizzle, MD     Allergies  Allergen Reactions  . Augmentin [Amoxicillin-Pot Clavulanate] Nausea And Vomiting    Vomiting   . Ciprofloxacin Hcl     Arm turned red  . Codeine Other (See Comments)    Makes pain worse  . Erythromycin     Rash   . Sulfur     rash     Review of Systems  Constitutional: Positive for chills and fever (low grade "99 but high for me"). Negative for weight loss.  HENT: Positive for congestion, postnasal drip, rhinorrhea, sinus pain, sneezing and sore throat. Negative for ear pain.   Eyes: Negative.  Respiratory: Positive for cough, shortness of breath (little bit - tight in chest) and wheezing. Negative for hemoptysis.   Cardiovascular: Negative.  Negative for chest pain.  Gastrointestinal: Negative.  Negative for abdominal pain, diarrhea, heartburn and vomiting.  Endocrine: Negative.   Genitourinary: Negative.   Musculoskeletal: Positive for myalgias. Negative for joint pain and neck pain.  Skin: Negative.  Negative for rash.  Allergic/Immunologic: Negative.     Neurological: Positive for headaches.  Hematological: Negative.   Psychiatric/Behavioral: Negative.   All other systems reviewed and are negative.      Objective:    Vitals:   08/24/18 1432  BP: 120/78  Pulse: 98  Resp: 16  Temp: 98 F (36.7 C)  TempSrc: Oral  SpO2: 98%  Weight: 187 lb 6 oz (85 kg)  Height: 5' 5" (1.651 m)      Physical Exam Vitals signs and nursing note reviewed.  Constitutional:      General: She is not in acute distress.    Appearance: She is well-developed. She is not ill-appearing, toxic-appearing or diaphoretic.  HENT:     Head: Normocephalic and atraumatic.     Right Ear: Hearing, tympanic membrane, ear canal and external ear normal.     Left Ear: Hearing, tympanic membrane, ear canal and external ear normal.     Nose: Septal deviation, nasal tenderness, mucosal edema, congestion and rhinorrhea present.     Right Sinus: No maxillary sinus tenderness or frontal sinus tenderness.     Left Sinus: Maxillary sinus tenderness present. No frontal sinus tenderness.     Mouth/Throat:     Mouth: Mucous membranes are not pale.     Pharynx: Uvula midline. No oropharyngeal exudate or uvula swelling.     Tonsils: No tonsillar abscesses.  Eyes:     General:        Right eye: No discharge.        Left eye: No discharge.     Conjunctiva/sclera: Conjunctivae normal.     Pupils: Pupils are equal, round, and reactive to light.  Neck:     Musculoskeletal: Normal range of motion and neck supple.     Trachea: No tracheal deviation.  Cardiovascular:     Rate and Rhythm: Normal rate and regular rhythm.     Pulses: Normal pulses.     Heart sounds: Normal heart sounds. No murmur. No friction rub. No gallop.   Pulmonary:     Effort: Pulmonary effort is normal. No respiratory distress.     Breath sounds: Normal breath sounds. No stridor. No wheezing, rhonchi or rales.  Chest:     Chest wall: No tenderness.  Abdominal:     General: Bowel sounds are normal. There  is no distension.     Palpations: Abdomen is soft.  Musculoskeletal: Normal range of motion.  Skin:    General: Skin is warm and dry.     Coloration: Skin is not pale.     Findings: No rash.  Neurological:     Mental Status: She is alert.     Motor: No abnormal muscle tone.     Coordination: Coordination normal.  Psychiatric:        Behavior: Behavior normal.           Assessment & Plan:   PT with 2 weeks URI sx, she has hx of sinus infections, did have acute worsening of sx 1 weeks ago, feels generally ill and has developed some chest tightness with cough.  Hx and exam consistent with  ABS - tx with abx Wheeze/cough tx with OTC cough meds, prednisone, albuterol    ICD-10-CM   1. Acute maxillary sinusitis, recurrence not specified J01.00 doxycycline (MONODOX) 100 MG capsule  2. Acute bronchitis, unspecified organism J20.9 predniSONE (DELTASONE) 20 MG tablet    albuterol (PROVENTIL HFA;VENTOLIN HFA) 108 (90 Base) MCG/ACT inhaler  3. 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    Flu negative  Diflucan rx for hx of yeast infections with antibiotic use  Delsa Grana, PA-C 08/24/18 2:39 PM

## 2018-08-27 ENCOUNTER — Encounter: Payer: Self-pay | Admitting: Family Medicine

## 2018-08-29 NOTE — Telephone Encounter (Signed)
error 

## 2018-10-16 ENCOUNTER — Encounter: Payer: Self-pay | Admitting: Family Medicine

## 2018-11-08 ENCOUNTER — Other Ambulatory Visit: Payer: Self-pay

## 2018-11-08 ENCOUNTER — Other Ambulatory Visit: Payer: BC Managed Care – PPO

## 2018-11-08 DIAGNOSIS — E785 Hyperlipidemia, unspecified: Secondary | ICD-10-CM

## 2018-11-08 DIAGNOSIS — E119 Type 2 diabetes mellitus without complications: Secondary | ICD-10-CM

## 2018-11-09 ENCOUNTER — Other Ambulatory Visit: Payer: BC Managed Care – PPO

## 2018-11-09 LAB — COMPREHENSIVE METABOLIC PANEL
AG Ratio: 1.8 (calc) (ref 1.0–2.5)
ALT: 17 U/L (ref 6–29)
AST: 14 U/L (ref 10–30)
Albumin: 4.2 g/dL (ref 3.6–5.1)
Alkaline phosphatase (APISO): 65 U/L (ref 31–125)
BUN: 10 mg/dL (ref 7–25)
CO2: 24 mmol/L (ref 20–32)
Calcium: 9.3 mg/dL (ref 8.6–10.2)
Chloride: 106 mmol/L (ref 98–110)
Creat: 0.65 mg/dL (ref 0.50–1.10)
Globulin: 2.3 g/dL (calc) (ref 1.9–3.7)
Glucose, Bld: 123 mg/dL — ABNORMAL HIGH (ref 65–99)
Potassium: 3.9 mmol/L (ref 3.5–5.3)
Sodium: 142 mmol/L (ref 135–146)
Total Bilirubin: 0.6 mg/dL (ref 0.2–1.2)
Total Protein: 6.5 g/dL (ref 6.1–8.1)

## 2018-11-09 LAB — CBC WITH DIFFERENTIAL/PLATELET
Absolute Monocytes: 555 cells/uL (ref 200–950)
Basophils Absolute: 52 cells/uL (ref 0–200)
Basophils Relative: 0.7 %
Eosinophils Absolute: 59 cells/uL (ref 15–500)
Eosinophils Relative: 0.8 %
HCT: 43.3 % (ref 35.0–45.0)
Hemoglobin: 15.1 g/dL (ref 11.7–15.5)
Lymphs Abs: 2538 cells/uL (ref 850–3900)
MCH: 30 pg (ref 27.0–33.0)
MCHC: 34.9 g/dL (ref 32.0–36.0)
MCV: 86.1 fL (ref 80.0–100.0)
MPV: 9.5 fL (ref 7.5–12.5)
Monocytes Relative: 7.5 %
Neutro Abs: 4196 cells/uL (ref 1500–7800)
Neutrophils Relative %: 56.7 %
Platelets: 360 10*3/uL (ref 140–400)
RBC: 5.03 10*6/uL (ref 3.80–5.10)
RDW: 12.3 % (ref 11.0–15.0)
Total Lymphocyte: 34.3 %
WBC: 7.4 10*3/uL (ref 3.8–10.8)

## 2018-11-09 LAB — LIPID PANEL
Cholesterol: 178 mg/dL (ref <200)
HDL: 39 mg/dL — ABNORMAL LOW (ref >=50)
LDL Cholesterol (Calc): 116 mg/dL (calc) — ABNORMAL HIGH
Non-HDL Cholesterol (Calc): 139 mg/dL (calc) — ABNORMAL HIGH (ref <130)
Total CHOL/HDL Ratio: 4.6 (calc) (ref <5.0)
Triglycerides: 122 mg/dL (ref <150)

## 2018-11-09 LAB — HEMOGLOBIN A1C
Hgb A1c MFr Bld: 5.9 % of total Hgb — ABNORMAL HIGH (ref <5.7)
Mean Plasma Glucose: 123 (calc)
eAG (mmol/L): 6.8 (calc)

## 2018-11-12 ENCOUNTER — Encounter: Payer: Self-pay | Admitting: Family Medicine

## 2018-11-12 ENCOUNTER — Other Ambulatory Visit: Payer: Self-pay

## 2018-11-12 ENCOUNTER — Ambulatory Visit (INDEPENDENT_AMBULATORY_CARE_PROVIDER_SITE_OTHER): Payer: BC Managed Care – PPO | Admitting: Family Medicine

## 2018-11-12 ENCOUNTER — Ambulatory Visit: Payer: BC Managed Care – PPO | Admitting: Family Medicine

## 2018-11-12 VITALS — BP 126/82 | HR 102 | Temp 98.7°F | Resp 14 | Ht 65.0 in | Wt 182.0 lb

## 2018-11-12 DIAGNOSIS — E119 Type 2 diabetes mellitus without complications: Secondary | ICD-10-CM | POA: Diagnosis not present

## 2018-11-12 MED ORDER — AMOXICILLIN 875 MG PO TABS: 875.0000 mg | ORAL_TABLET | Freq: Two times a day (BID) | ORAL | 0 refills | Status: DC

## 2018-11-12 MED ORDER — PRAVASTATIN SODIUM 20 MG PO TABS: 20.0000 mg | ORAL_TABLET | Freq: Every day | ORAL | 3 refills | Status: DC

## 2018-11-12 NOTE — Progress Notes (Signed)
Subjective:    Patient ID: Candice Jones, female    DOB: [birth year], 43 y.o.   MRN: [id number]  HPI  Wt Readings from Last 3 Encounters:  11/12/18 182 lb (82.6 kg)  08/24/18 187 lb 6 oz (85 kg)  06/14/18 191 lb (86.6 kg)    01/30/18 Patient recently saw GYN who performed lab work and found her fasting blood sugar to be greater than 150.  Follow-up hemoglobin A1c was found to be 6.9.  Her LDL cholesterol was also elevated greater than 130.  She is here today to discuss these lab results.  She is gained 16 pounds since last time I saw her a year ago.  However she states that she has been trying to eat a low carbohydrate diet over the last several months.  She is very little bread rice potatoes or pasta.  She is drinking no soda no juice.  She is avoiding junk food.  She eats very few if any sweets or sugar or cakes or cookies.  She is trying to eat more protein and more salads.  She is not getting any regular exercise other than just normal daily activity.  She does report some polyuria and some polydipsia she denies any blurry vision.  She denies any neuropathy in her feet.  At that time, my plan was: Spent more than 25 minutes today with the patient discussing her laboratory findings and the implications.  She is apparently developed type 2 diabetes mellitus.  We discussed treatment options including lifestyle changes such as 10 to 15 pounds weight loss, 30 minutes a day of aerobic exercise, a low carbohydrate diet.  She states that she would like to try to increase her exercise however she has been unsuccessful in losing weight and she states that she is eating a well-balanced diet now for several months.  She is emphatic that her diet is not the problem.  Therefore she believes that she will likely need medication to assist in lowering her blood sugar.  We will start her on metformin 500 mg p.o. twice daily and recheck the patient back in 3 months.  At that time repeat a fasting lipid panel along with a  hemoglobin A1c as well as a urine microalbumin.  Patient will require a statin due to an elevated ten-year risk of cardiovascular disease.  We will also likely recommend putting the patient on a low-dose ACE inhibitor however patient would like to take medication addition slowly  11/12/18 Patient has lost 9 pounds since her office visit in January.  Her hemoglobin A1c remains well controlled and is down from 6.0-5.9.  She denies any polyuria, polydipsia, or blurry vision.  She denies any chest pain shortness of breath or dyspnea on exertion.  Blood pressure today is well controlled at 126/82.  Recently she has been dealing with an abscessed tooth and is currently taking Keflex which is upsetting her stomach.  In the past she has taken amoxicillin and tolerated that well and would like to switch antibiotics.  She is not taking a statin having discontinued Lipitor due to myalgias.  Her lab work is listed below Lab on 11/08/2018  Component Date Value Ref Range Status   WBC 11/08/2018 7.4  3.8 - 10.8 Thousand/uL Final   RBC 11/08/2018 5.03  3.80 - 5.10 Million/uL Final   Hemoglobin 11/08/2018 15.1  11.7 - 15.5 g/dL Final   HCT 11/08/2018 43.3  35.0 - 45.0 % Final   MCV 11/08/2018 86.1  80.0 -  100.0 fL Final   MCH 11/08/2018 30.0  27.0 - 33.0 pg Final   MCHC 11/08/2018 34.9  32.0 - 36.0 g/dL Final   RDW 11/08/2018 12.3  11.0 - 15.0 % Final   Platelets 11/08/2018 360  140 - 400 Thousand/uL Final   MPV 11/08/2018 9.5  7.5 - 12.5 fL Final   Neutro Abs 11/08/2018 4,196  1,500 - 7,800 cells/uL Final   Lymphs Abs 11/08/2018 2,538  850 - 3,900 cells/uL Final   Absolute Monocytes 11/08/2018 555  200 - 950 cells/uL Final   Eosinophils Absolute 11/08/2018 59  15 - 500 cells/uL Final   Basophils Absolute 11/08/2018 52  0 - 200 cells/uL Final   Neutrophils Relative % 11/08/2018 56.7  % Final   Total Lymphocyte 11/08/2018 34.3  % Final   Monocytes Relative 11/08/2018 7.5  % Final    Eosinophils Relative 11/08/2018 0.8  % Final   Basophils Relative 11/08/2018 0.7  % Final   Glucose, Bld 11/08/2018 123* 65 - 99 mg/dL Final   Comment: .            Fasting reference interval . For someone without known diabetes, a glucose value between 100 and 125 mg/dL is consistent with prediabetes and should be confirmed with a follow-up test. .    BUN 11/08/2018 10  7 - 25 mg/dL Final   Creat 11/08/2018 0.65  0.50 - 1.10 mg/dL Final   BUN/Creatinine Ratio 20/23/3435 NOT APPLICABLE  6 - 22 (calc) Final   Sodium 11/08/2018 142  135 - 146 mmol/L Final   Potassium 11/08/2018 3.9  3.5 - 5.3 mmol/L Final   Chloride 11/08/2018 106  98 - 110 mmol/L Final   CO2 11/08/2018 24  20 - 32 mmol/L Final   Calcium 11/08/2018 9.3  8.6 - 10.2 mg/dL Final   Total Protein 11/08/2018 6.5  6.1 - 8.1 g/dL Final   Albumin 11/08/2018 4.2  3.6 - 5.1 g/dL Final   Globulin 11/08/2018 2.3  1.9 - 3.7 g/dL (calc) Final   AG Ratio 11/08/2018 1.8  1.0 - 2.5 (calc) Final   Total Bilirubin 11/08/2018 0.6  0.2 - 1.2 mg/dL Final   Alkaline phosphatase (APISO) 11/08/2018 65  31 - 125 U/L Final   AST 11/08/2018 14  10 - 30 U/L Final   ALT 11/08/2018 17  6 - 29 U/L Final   Cholesterol 11/08/2018 178  <200 mg/dL Final   HDL 11/08/2018 39* > OR = 50 mg/dL Final   Triglycerides 11/08/2018 122  <150 mg/dL Final   LDL Cholesterol (Calc) 11/08/2018 116* mg/dL (calc) Final   Comment: Reference range: <100 . Desirable range <100 mg/dL for primary prevention;   <70 mg/dL for patients with CHD or diabetic patients  with > or = 2 CHD risk factors. Marland Kitchen LDL-C is now calculated using the Martin-Hopkins  calculation, which is a validated novel method providing  better accuracy than the Friedewald equation in the  estimation of LDL-C.  Cresenciano Genre et al. Annamaria Helling. 6861;683(72): 2061-2068  (http://education.[facility].com/faq/FAQ164)    Total CHOL/HDL Ratio 11/08/2018 4.6  <5.0 (calc) Final   Non-HDL  Cholesterol (Calc) 11/08/2018 139* <130 mg/dL (calc) Final   Comment: For patients with diabetes plus 1 major ASCVD risk  factor, treating to a non-HDL-C goal of <100 mg/dL  (LDL-C of <70 mg/dL) is considered a therapeutic  option.    Hgb A1c MFr Bld 11/08/2018 5.9* <5.7 % of total Hgb Final   Comment: For someone without known diabetes, a  hemoglobin  A1c value between 5.7% and 6.4% is consistent with prediabetes and should be confirmed with a  follow-up test. . For someone with known diabetes, a value <7% indicates that their diabetes is well controlled. A1c targets should be individualized based on duration of diabetes, age, comorbid conditions, and other considerations. . This assay result is consistent with an increased risk of diabetes. . Currently, no consensus exists regarding use of hemoglobin A1c for diagnosis of diabetes for children. .    Mean Plasma Glucose 11/08/2018 123  (calc) Final   eAG (mmol/L) 11/08/2018 6.8  (calc) Final    Past Medical History:  Diagnosis Date   Adenomyosis    Asthma    Gall stones    Gestational diabetes mellitus    Hyperlipidemia    IBS (irritable bowel syndrome)    Kidney stones    Meniere's syndrome    Vertigo    Past Surgical History:  Procedure Laterality Date   CESAREAN SECTION  08/1999, 05/2003   CHOLECYSTECTOMY  2011   CYSTECTOMY  1997   left wrist    ENDOMETRIAL ABLATION  01/2010   HEMORRHOID BANDING  2015   MINOR HEMORRHOIDECTOMY  04/2014   thrombosed   [place] EXTRACTION  1996/1997   Current Outpatient Medications on File Prior to Visit  Medication Sig Dispense Refill   albuterol (PROVENTIL HFA;VENTOLIN HFA) 108 (90 Base) MCG/ACT inhaler Inhale 2 puffs into the lungs every 4 (four) hours as needed for wheezing or shortness of breath. 1 Inhaler 0   Blood Glucose Monitoring Suppl KIT Use to check fasting glucose daily ICD10: R73.01.Dispense based on insurance preference 1 each 0    fluconazole (DIFLUCAN) 150 MG tablet Take 1 tablet (150 mg total) by mouth every 3 (three) days as needed (for vaginal itching/yeast infection sx). 2 tablet 0   glucose blood test strip Use with glucose meter to check blood sugar.ICD10: R73.01.Dispense based on insurance preference 100 each 12   Lancets (FREESTYLE) lancets Use to check blood sugar ICD10: R73.01.Dispense based on insurance preference 100 each 12   levonorgestrel-ethinyl estradiol (SEASONALE,INTROVALE,JOLESSA) 0.15-0.03 MG tablet Take 1 tablet by mouth daily.     loratadine (CLARITIN) 10 MG tablet Take 10 mg by mouth daily.     metFORMIN (GLUCOPHAGE XR) 500 MG 24 hr tablet Take 2 tablets (1,000 mg total) by mouth daily with breakfast. 30 tablet 11   Current Facility-Administered Medications on File Prior to Visit  Medication Dose Route Frequency Provider Last Rate Last Dose   0.9 %  sodium chloride infusion  500 mL Intravenous Continuous Gatha Mayer, MD       Allergies  Allergen Reactions   Augmentin [Amoxicillin-Pot Clavulanate] Nausea And Vomiting    Vomiting    Ciprofloxacin Hcl     Arm turned red   Codeine Other (See Comments)    Makes pain worse   Erythromycin     Rash    Sulfur     rash   Social History   Socioeconomic History   Marital status: Married    Spouse name: Not on file   Number of children: Not on file   Years of education: Not on file   Highest education level: Not on file  Occupational History    Employer: [employer] resource strain: Not on file   Food insecurity:    Worry: Not on file    Inability: Not on file   Transportation needs:  Medical: Not on file    Non-medical: Not on file  Tobacco Use   Smoking status: Never Smoker   Smokeless tobacco: Never Used  Substance and Sexual Activity   Alcohol use: No   Drug use: No   Sexual activity: Yes  Lifestyle   Physical activity:    Days per week: Not on file     Minutes per session: Not on file   Stress: Not on file  Relationships   Social connections:    Talks on phone: Not on file    Gets together: Not on file    Attends religious service: Not on file    Active member of club or organization: Not on file    Attends meetings of clubs or organizations: Not on file    Relationship status: Not on file   Intimate partner violence:    Fear of current or ex partner: Not on file    Emotionally abused: Not on file    Physically abused: Not on file    Forced sexual activity: Not on file  Other Topics Concern   Not on file  Social History Narrative   Married, 2 sons and 1 daughter   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   No caffeine      Review of Systems  Constitutional: Negative.   All other systems reviewed and are negative.      Objective:   Physical Exam Vitals signs reviewed.  Constitutional:      General: She is not in acute distress.    Appearance: She is well-developed. She is not diaphoretic.  Cardiovascular:     Rate and Rhythm: Normal rate and regular rhythm.     Heart sounds: Normal heart sounds.  Pulmonary:     Effort: Pulmonary effort is normal. No respiratory distress.     Breath sounds: Normal breath sounds. No stridor. No wheezing.           Assessment & Plan:  Controlled type 2 diabetes mellitus without complication, without long-term current use of insulin (HCC)  Diabetes and blood pressure are well controlled however I will add pravastatin 20 mg a day to her medications primarily due to her diabetes and the fact her LDL cholesterol is greater than 100.  Continue to encourage the patient to work on weight loss.  She is lost 9 pounds since January and I congratulated her on that.  The remainder of her lab work is excellent.  I did switch her Keflex to amoxicillin for her abscessed tooth.  She has an appointment to see her dentist later today.

## 2018-11-13 ENCOUNTER — Other Ambulatory Visit: Payer: Self-pay | Admitting: *Deleted

## 2018-11-13 MED ORDER — METFORMIN HCL ER 500 MG PO TB24: 1000.0000 mg | ORAL_TABLET | Freq: Every day | ORAL | 11 refills | Status: DC

## 2019-02-12 ENCOUNTER — Encounter: Payer: Self-pay | Admitting: Family Medicine

## 2019-02-14 ENCOUNTER — Ambulatory Visit: Payer: BC Managed Care – PPO | Admitting: Family Medicine

## 2019-05-21 ENCOUNTER — Ambulatory Visit (INDEPENDENT_AMBULATORY_CARE_PROVIDER_SITE_OTHER): Payer: BC Managed Care – PPO | Admitting: Family Medicine

## 2019-05-21 ENCOUNTER — Other Ambulatory Visit: Payer: Self-pay

## 2019-05-21 ENCOUNTER — Encounter: Payer: Self-pay | Admitting: Family Medicine

## 2019-05-21 VITALS — BP 130/80 | HR 96 | Temp 98.3°F | Resp 16 | Ht 65.0 in | Wt 191.0 lb

## 2019-05-21 DIAGNOSIS — E119 Type 2 diabetes mellitus without complications: Secondary | ICD-10-CM

## 2019-05-21 DIAGNOSIS — Z23 Encounter for immunization: Secondary | ICD-10-CM | POA: Diagnosis not present

## 2019-05-21 NOTE — Progress Notes (Signed)
Subjective:    Patient ID: Candice Jones, female    DOB: [birth year], 43 y.o.   MRN: [id number]  HPI  Patient is a 43 year old Caucasian female who has a history of type 2 diabetes mellitus.  She is currently on Metformin.  She has not been checking her blood sugars recently however she denies any evidence of hypoglycemia.  She denies any polyuria, polydipsia, or blurry vision.  She is on Maxide for Mnire's disease not for hypertension.  However she denies any chest pain shortness of breath or dyspnea on exertion.  She also has a history of hyperlipidemia for which she takes pravastatin.  She denies any myalgias or right upper quadrant pain.  She denies any neuropathy in her feet.  She is due for her flu shot today.  She is also due for Pneumovax 23.  She is requesting a note for work.  The patient works as a Pharmacist, hospital.  According to CDC guidelines, she is at high risk potentially for complications from [id number] were she to contract the virus.  This is due to her type 2 diabetes mellitus.  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  However she needs a work note for documentation. Past Medical History:  Diagnosis Date  . Adenomyosis   . Asthma   . Gall stones   . Gestational diabetes mellitus   . Hyperlipidemia   . IBS (irritable bowel syndrome)   . Kidney stones   . Meniere's syndrome   . Vertigo    Past Surgical History:  Procedure Laterality Date  . CESAREAN SECTION  08/1999, 05/2003  . CHOLECYSTECTOMY  2011  . CYSTECTOMY  1997   left wrist   . ENDOMETRIAL ABLATION  01/2010  . HEMORRHOID BANDING  2015  . MINOR HEMORRHOIDECTOMY  04/2014   thrombosed  . WISDOM TOOTH EXTRACTION  1996/1997   Current Outpatient Medications on File Prior to Visit  Medication Sig Dispense Refill  . albuterol (PROVENTIL HFA;VENTOLIN HFA) 108 (90 Base) MCG/ACT inhaler Inhale 2 puffs into the lungs every 4 (four) hours as needed for wheezing or shortness of breath. 1  Inhaler 0  . Blood Glucose Monitoring Suppl KIT Use to check fasting glucose daily ICD10: R73.01.Dispense based on insurance preference 1 each 0  . glucose blood test strip Use with glucose meter to check blood sugar.ICD10: R73.01.Dispense based on insurance preference 100 each 12  . Lancets (FREESTYLE) lancets Use to check blood sugar ICD10: R73.01.Dispense based on insurance preference 100 each 12  . levonorgestrel-ethinyl estradiol (SEASONALE,INTROVALE,JOLESSA) 0.15-0.03 MG tablet Take 1 tablet by mouth daily.    Marland Kitchen loratadine (CLARITIN) 10 MG tablet Take 10 mg by mouth daily.    . metFORMIN (GLUCOPHAGE XR) 500 MG 24 hr tablet Take 2 tablets (1,000 mg total) by mouth daily with breakfast. 60 tablet 11  . pravastatin (PRAVACHOL) 20 MG tablet Take 1 tablet (20 mg total) by mouth daily. 90 tablet 3  . triamterene-hydrochlorothiazide (DYAZIDE) 37.5-25 MG capsule Take 1 capsule by mouth daily.      Current Facility-Administered Medications on File Prior to Visit  Medication Dose Route Frequency Provider Last Rate Last Dose  . 0.9 %  sodium chloride infusion  500 mL Intravenous Continuous Gatha Mayer, MD       Allergies  Allergen Reactions  . Augmentin [Amoxicillin-Pot Clavulanate] Nausea And Vomiting    Vomiting   . Ciprofloxacin Hcl     Arm turned red  . Codeine Other (  See Comments)    Makes pain worse  . Erythromycin     Rash   . Sulfur     rash   Social History   Socioeconomic History  . Marital status: Married    Spouse name: Not on file  . Number of children: Not on file  . Years of education: Not on file  . Highest education level: Not on file  Occupational History    Employer: [employer]  Social Needs  . Financial resource strain: Not on file  . Food insecurity    Worry: Not on file    Inability: Not on file  . Transportation needs    Medical: Not on file    Non-medical: Not on file  Tobacco Use  . Smoking status: Never Smoker  . Smokeless tobacco:  Never Used  Substance and Sexual Activity  . Alcohol use: No  . Drug use: No  . Sexual activity: Yes  Lifestyle  . Physical activity    Days per week: Not on file    Minutes per session: Not on file  . Stress: Not on file  Relationships  . Social Herbalist on phone: Not on file    Gets together: Not on file    Attends religious service: Not on file    Active member of club or organization: Not on file    Attends meetings of clubs or organizations: Not on file    Relationship status: Not on file  . Intimate partner violence    Fear of current or ex partner: Not on file    Emotionally abused: Not on file    Physically abused: Not on file    Forced sexual activity: Not on file  Other Topics Concern  . 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   All other systems reviewed and are negative.      Objective:   Physical Exam Vitals signs reviewed.  Constitutional:      General: She is not in acute distress.    Appearance: She is well-developed. She is not diaphoretic.  Cardiovascular:     Rate and Rhythm: Normal rate and regular rhythm.     Heart sounds: Normal heart sounds.  Pulmonary:     Effort: Pulmonary effort is normal. No respiratory distress.     Breath sounds: Normal breath sounds. No stridor. No wheezing.           Assessment & Plan:  Controlled type 2 diabetes mellitus without complication, without long-term current use of insulin ([place]) - Plan: Hemoglobin A1c, COMPLETE METABOLIC PANEL WITH GFR, Microalbumin, urine  Patient's blood pressure today is well controlled.  I will check a hemoglobin A1c.  Goal hemoglobin A1c is less than 6.5.  Diabetic foot exam is up-to-date.  The patient is not fasting today and therefore cannot check her cholesterol.  She did receive her flu shot today along with  Pneumovax 23.  I did write the patient a note outlining the fact that she is considered high risk for complications due to her type 2 diabetes mellitus and that she should be allowed to work remotely if possible.

## 2019-05-21 NOTE — Addendum Note (Signed)
Addended by: Shary Decamp B on: 05/21/2019 05:00 PM   Modules accepted: Orders

## 2019-05-22 LAB — COMPLETE METABOLIC PANEL WITH GFR
AG Ratio: 1.7 (calc) (ref 1.0–2.5)
ALT: 14 U/L (ref 6–29)
AST: 14 U/L (ref 10–30)
Albumin: 4.1 g/dL (ref 3.6–5.1)
Alkaline phosphatase (APISO): 69 U/L (ref 31–125)
BUN: 12 mg/dL (ref 7–25)
CO2: 26 mmol/L (ref 20–32)
Calcium: 9.7 mg/dL (ref 8.6–10.2)
Chloride: 99 mmol/L (ref 98–110)
Creat: 0.63 mg/dL (ref 0.50–1.10)
GFR, Est African American: 126 mL/min/{1.73_m2} (ref >=60)
GFR, Est Non African American: 109 mL/min/{1.73_m2} (ref >=60)
Globulin: 2.4 g/dL (calc) (ref 1.9–3.7)
Glucose, Bld: 125 mg/dL — ABNORMAL HIGH (ref 65–99)
Potassium: 3.4 mmol/L — ABNORMAL LOW (ref 3.5–5.3)
Sodium: 138 mmol/L (ref 135–146)
Total Bilirubin: 0.3 mg/dL (ref 0.2–1.2)
Total Protein: 6.5 g/dL (ref 6.1–8.1)

## 2019-05-22 LAB — HEMOGLOBIN A1C
Hgb A1c MFr Bld: 6 % of total Hgb — ABNORMAL HIGH (ref <5.7)
Mean Plasma Glucose: 126 (calc)
eAG (mmol/L): 7 (calc)

## 2019-05-22 LAB — MICROALBUMIN, URINE: Microalb, Ur: 0.5 mg/dL

## 2019-05-23 ENCOUNTER — Encounter: Payer: Self-pay | Admitting: Family Medicine

## 2019-07-29 ENCOUNTER — Encounter: Payer: Self-pay | Admitting: Family Medicine

## 2019-07-30 ENCOUNTER — Other Ambulatory Visit: Payer: Self-pay | Admitting: Family Medicine

## 2019-07-30 MED ORDER — BLOOD GLUCOSE MONITORING SUPPL KIT: PACK | 0 refills | Status: DC

## 2019-08-01 ENCOUNTER — Ambulatory Visit: Payer: Self-pay | Admitting: Family Medicine

## 2019-08-05 ENCOUNTER — Ambulatory Visit: Payer: BC Managed Care – PPO | Admitting: Family Medicine

## 2019-08-05 ENCOUNTER — Encounter: Payer: Self-pay | Admitting: Family Medicine

## 2019-08-05 VITALS — BP 118/92 | HR 122 | Temp 96.6°F | Resp 16 | Ht 65.0 in | Wt 191.0 lb

## 2019-08-05 DIAGNOSIS — R800 Isolated proteinuria: Secondary | ICD-10-CM

## 2019-08-05 LAB — URINALYSIS, ROUTINE W REFLEX MICROSCOPIC
Bilirubin Urine: NEGATIVE
Glucose, UA: NEGATIVE
Hgb urine dipstick: NEGATIVE
Ketones, ur: NEGATIVE
Leukocytes,Ua: NEGATIVE
Nitrite: NEGATIVE
Protein, ur: NEGATIVE
Specific Gravity, Urine: 1.015 (ref 1.001–1.03)
pH: 7 (ref 5.0–8.0)

## 2019-08-05 MED ORDER — LOSARTAN POTASSIUM 25 MG PO TABS: 25.0000 mg | ORAL_TABLET | Freq: Every day | ORAL | 5 refills | Status: DC

## 2019-08-05 NOTE — Progress Notes (Signed)
Subjective:    Patient ID: Candice Jones, female    DOB: [birth year], 44 y.o.   MRN: [id number]  HPI  Patient recently saw her gynecologist for her annual exam.  At that time her urinalysis showed bilirubin as well as +1 protein.  She was referred to me for further testing.  Patient does have a past medical history of diabetes.  She is not on an ACE or an ARB due to a history of hypotension.  She takes Maxide due to her history of Mnire's disease and therefore has elected to continue this rather than switching to an ACE inhibitor or an angiotensin receptor blocker.  Today's urinalysis shows no protein, no blood, no bilirubin, no nitrites, no leukocyte esterase.  Past Medical History:  Diagnosis Date  . Adenomyosis   . Asthma   . Gall stones   . Gestational diabetes mellitus   . Hyperlipidemia   . IBS (irritable bowel syndrome)   . Kidney stones   . Meniere's syndrome   . Vertigo    Past Surgical History:  Procedure Laterality Date  . CESAREAN SECTION  08/1999, 05/2003  . CHOLECYSTECTOMY  2011  . CYSTECTOMY  1997   left wrist   . ENDOMETRIAL ABLATION  01/2010  . HEMORRHOID BANDING  2015  . MINOR HEMORRHOIDECTOMY  04/2014   thrombosed  . WISDOM TOOTH EXTRACTION  1996/1997   Current Outpatient Medications on File Prior to Visit  Medication Sig Dispense Refill  . albuterol (PROVENTIL HFA;VENTOLIN HFA) 108 (90 Base) MCG/ACT inhaler Inhale 2 puffs into the lungs every 4 (four) hours as needed for wheezing or shortness of breath. 1 Inhaler 0  . Blood Glucose Monitoring Suppl KIT Use to check fasting glucose daily ICD10: R73.01.Dispense based on insurance preference 1 kit 0  . glucose blood test strip Use with glucose meter to check blood sugar.ICD10: R73.01.Dispense based on insurance preference 100 each 12  . Lancets (FREESTYLE) lancets Use to check blood sugar ICD10: R73.01.Dispense based on insurance preference 100 each 12  . levonorgestrel-ethinyl estradiol  (SEASONALE,INTROVALE,JOLESSA) 0.15-0.03 MG tablet Take 1 tablet by mouth daily.    Marland Kitchen loratadine (CLARITIN) 10 MG tablet Take 10 mg by mouth daily.    . metFORMIN (GLUCOPHAGE XR) 500 MG 24 hr tablet Take 2 tablets (1,000 mg total) by mouth daily with breakfast. 60 tablet 11  . pravastatin (PRAVACHOL) 20 MG tablet Take 1 tablet (20 mg total) by mouth daily. 90 tablet 3  . triamterene-hydrochlorothiazide (DYAZIDE) 37.5-25 MG capsule Take 1 capsule by mouth daily.      Current Facility-Administered Medications on File Prior to Visit  Medication Dose Route Frequency Provider Last Rate Last Admin  . 0.9 %  sodium chloride infusion  500 mL Intravenous Continuous Gatha Mayer, MD       Allergies  Allergen Reactions  . Augmentin [Amoxicillin-Pot Clavulanate] Nausea And Vomiting    Vomiting   . Ciprofloxacin Hcl     Arm turned red  . Codeine Other (See Comments)    Makes pain worse  . Erythromycin     Rash   . Sulfur     rash   Social History   Socioeconomic History  . Marital status: Married    Spouse name: Not on file  . Number of children: Not on file  . Years of education: Not on file  . Highest education level: Not on file  Occupational History    Employer: [employer]  Tobacco Use  . Smoking status:  Never Smoker  . Smokeless tobacco: Never Used  Substance and Sexual Activity  . Alcohol use: No  . Drug use: No  . Sexual activity: Yes  Other Topics Concern  . 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   No caffeine   Social Determinants of Health   Financial Resource Strain:   . Difficulty of Paying Living Expenses: Not on file  Food Insecurity:   . Worried About Charity fundraiser in the Last Year: Not on file  . Ran Out of Food in the Last Year: Not on file  Transportation Needs:   . Lack of Transportation (Medical): Not on file  . Lack of Transportation  (Non-Medical): Not on file  Physical Activity:   . Days of Exercise per Week: Not on file  . Minutes of Exercise per Session: Not on file  Stress:   . Feeling of Stress : Not on file  Social Connections:   . Frequency of Communication with Friends and Family: Not on file  . Frequency of Social Gatherings with Friends and Family: Not on file  . Attends Religious Services: Not on file  . Active Member of Clubs or Organizations: Not on file  . Attends Archivist Meetings: Not on file  . Marital Status: Not on file  Intimate Partner Violence:   . Fear of Current or Ex-Partner: Not on file  . Emotionally Abused: Not on file  . Physically Abused: Not on file  . Sexually Abused: Not on file      Review of Systems  Constitutional: Negative.   All other systems reviewed and are negative.      Objective:   Physical Exam Vitals reviewed.  Constitutional:      General: She is not in acute distress.    Appearance: She is well-developed. She is not diaphoretic.  Cardiovascular:     Rate and Rhythm: Normal rate and regular rhythm.     Heart sounds: Normal heart sounds.  Pulmonary:     Effort: Pulmonary effort is normal. No respiratory distress.     Breath sounds: Normal breath sounds. No stridor. No wheezing.           Assessment & Plan:  Isolated proteinuria without specific morphologic lesion - Plan: Urinalysis, Routine w reflex microscopic  Urinalysis from her gynecologist appears to be transient proteinuria l.  Follow-up protein level today is normal.  We discussed performing a 24-hour collection however based on this urinalysis I doubt she would have nephrotic range proteinuria.  We discussed beginning an angiotensin receptor blocker given her history of diabetes for renal protection.  Risk includes hypotension as well as hyperkalemia.  Patient would like to try low-dose losartan 25 mg a day and then recheck a BMP in 1 month to monitor potassium levels.  At that time  we will repeat her hemoglobin A1c and I will also check a urine microalbumin

## 2019-09-14 ENCOUNTER — Other Ambulatory Visit: Payer: Self-pay | Admitting: Family Medicine

## 2019-12-04 ENCOUNTER — Encounter: Payer: Self-pay | Admitting: Family Medicine

## 2019-12-04 ENCOUNTER — Ambulatory Visit: Payer: BC Managed Care – PPO | Admitting: Family Medicine

## 2019-12-04 ENCOUNTER — Other Ambulatory Visit: Payer: Self-pay

## 2019-12-04 VITALS — BP 112/70 | HR 110 | Temp 98.2°F | Resp 16 | Ht 65.0 in | Wt 196.0 lb

## 2019-12-04 DIAGNOSIS — J0100 Acute maxillary sinusitis, unspecified: Secondary | ICD-10-CM

## 2019-12-04 DIAGNOSIS — H8101 Meniere's disease, right ear: Secondary | ICD-10-CM

## 2019-12-04 MED ORDER — PREDNISONE 10 MG PO TABS: ORAL_TABLET | ORAL | 0 refills | Status: DC

## 2019-12-04 MED ORDER — AMOXICILLIN 875 MG PO TABS: 875.0000 mg | ORAL_TABLET | Freq: Two times a day (BID) | ORAL | 0 refills | Status: DC

## 2019-12-04 MED ORDER — FLUCONAZOLE 150 MG PO TABS: ORAL_TABLET | ORAL | 1 refills | Status: DC

## 2019-12-04 NOTE — Progress Notes (Signed)
   Subjective:    Patient ID: Candice Jones, female    DOB: April 09, 1976, 44 y.o.   MRN: [id number]  Patient presents for R Ear Pain (x3 days- sinus pressure to ear, teeth, roaring in ear and popping)   She has history of meneiers of disease, followed by Dr Suszanne Conners, ENT.   Monday started with ocean sounds in her right ear, felt similar to her previous menisures, now ear is sensitive to toch and side of face felt, hot and teeth painful Saturday had a reflux episode into her sinus and back of throat before the symptoms    She has underlying allergies on xyzal   DM- last A1C 6%, on metformin in OCT      Review Of Systems:  GEN- denies fatigue, fever, weight loss,weakness, recent illness HEENT- denies eye drainage, change in vision, nasal discharge, CVS- denies chest pain, palpitations RESP- denies SOB, cough, wheeze ABD- denies N/V, change in stools, abd pain GU- denies dysuria, hematuria, dribbling, incontinence MSK- denies joint pain, muscle aches, injury Neuro- denies headache, dizziness, syncope, seizure activity       Objective:    BP 112/70   Pulse (!) 110   Temp 98.2 F (36.8 C) (Temporal)   Resp 16   Ht 5\' 5"  (1.651 m)   Wt 196 lb (88.9 kg)   SpO2 98%   BMI 32.62 kg/m  GEN- NAD, alert and oriented x3 HEENT- PERRL, EOMI, non injected sclera, pink conjunctiva, MMM, oropharynx clear, TM Clear no effusion, canal clear, TTP Right maxillary sinus, mild swelling ant right ear , and post auricular  Neck- Supple, no thyromegaly, shotty submandibular LAD  CVS- RR HR 100, no murmur RESP-CTAB EXT- No edema Pulses- Radial 2+        Assessment & Plan:      Problem List Items Addressed This Visit    None    Visit Diagnoses    Acute maxillary sinusitis, recurrence not specified    -  Primary   Based on history with sinus issues, menieres and infection, will go ahead and start amoxicillin, given prednisone taper, continue allergy meds   Relevant Medications   levocetirizine  (XYZAL) 5 MG tablet   amoxicillin (AMOXIL) 875 MG tablet   fluconazole (DIFLUCAN) 150 MG tablet   predniSONE (DELTASONE) 10 MG tablet   Meniere disease, right          Note: This dictation was prepared with Dragon dictation along with smaller phrase technology. Any transcriptional errors that result from this process are unintentional.

## 2019-12-04 NOTE — Patient Instructions (Signed)
F/U as needed

## 2019-12-31 ENCOUNTER — Other Ambulatory Visit: Payer: Self-pay | Admitting: Family Medicine

## 2020-01-23 ENCOUNTER — Encounter: Payer: Self-pay | Admitting: Family Medicine

## 2020-02-11 ENCOUNTER — Other Ambulatory Visit: Payer: Self-pay

## 2020-02-11 ENCOUNTER — Telehealth (INDEPENDENT_AMBULATORY_CARE_PROVIDER_SITE_OTHER): Payer: BC Managed Care – PPO | Admitting: Nurse Practitioner

## 2020-02-11 ENCOUNTER — Encounter: Payer: Self-pay | Admitting: Nurse Practitioner

## 2020-02-11 DIAGNOSIS — Z889 Allergy status to unspecified drugs, medicaments and biological substances status: Secondary | ICD-10-CM

## 2020-02-11 DIAGNOSIS — J0100 Acute maxillary sinusitis, unspecified: Secondary | ICD-10-CM | POA: Diagnosis not present

## 2020-02-11 DIAGNOSIS — J209 Acute bronchitis, unspecified: Secondary | ICD-10-CM | POA: Diagnosis not present

## 2020-02-11 DIAGNOSIS — R05 Cough: Secondary | ICD-10-CM

## 2020-02-11 DIAGNOSIS — R059 Cough, unspecified: Secondary | ICD-10-CM

## 2020-02-11 MED ORDER — BENZONATATE 100 MG PO CAPS: 100.0000 mg | ORAL_CAPSULE | Freq: Two times a day (BID) | ORAL | 0 refills | Status: DC | PRN

## 2020-02-11 MED ORDER — ALBUTEROL SULFATE HFA 108 (90 BASE) MCG/ACT IN AERS: 2.0000 | INHALATION_SPRAY | RESPIRATORY_TRACT | 0 refills | Status: AC | PRN

## 2020-02-11 MED ORDER — FLUTICASONE PROPIONATE 50 MCG/ACT NA SUSP: 2.0000 | Freq: Every day | NASAL | 0 refills | Status: DC

## 2020-02-11 MED ORDER — DOXYCYCLINE HYCLATE 100 MG PO TABS: 100.0000 mg | ORAL_TABLET | Freq: Two times a day (BID) | ORAL | 0 refills | Status: DC

## 2020-02-11 NOTE — Progress Notes (Signed)
Virtual Visit via Telephone Note  I connected with Candice Jones on 02/11/20 at 10:15 AM EST by telephone and verified that I am speaking with the correct person using two identifiers.   I discussed the limitations, risks, security and privacy concerns of performing an evaluation and management service by telephone and the availability of in person appointments. I also discussed with the patient that there may be a patient responsible charge related to this service. The patient expressed understanding and agreed to proceed.   History of Present Illness: Pt is a 44 year old female presenting for SXS of a illness for telephone visit being that she is out of town in [place]. She is on vacation. Sxs. Started on Friday of nasal congestion, sore throat, nasal drainage, antihistamine was taken and did help to dry nasal drainage some however then she began to experience  head congestion, she began to take OTC nyquil cold and flu that didn't help, Last night she developed chills, today she is diaphoretic on and off, she has had a cough that continues today that can be productive at times of yellow phlegm. Pt does report that she gets coughing spells until she becomes light headed and she has used albuterol inhaler in the past however does not have now. Over the past 2 days she has begin to experience maxillary sinus pressure with increased nasal mucous that has turned from clear to yellow, her lower/upper teeth are now hurting, increased sneezing which she contributes to traveling outside with h/o allergies however is taking her usual daily Zyzol and one spray daily Flonase. She denied contact with COVID positive persons, no others presently with similar symptoms. Spouse did have a cough last week but is now better.  Covid vaccine 08/2019, 09/2019   The pt needs prescriptions sent to her local pharmacy at her vacation destination.    Past Medical History:  Diagnosis Date   Adenomyosis    Asthma    Gall stones     Gestational diabetes mellitus    Hyperlipidemia    IBS (irritable bowel syndrome)    Kidney stones    Meniere's syndrome    Vertigo    Past Surgical History:  Procedure Laterality Date   CESAREAN SECTION  08/1999, 05/2003   CHOLECYSTECTOMY  2011   CYSTECTOMY  1997   left wrist    ENDOMETRIAL ABLATION  01/2010   HEMORRHOID BANDING  2015   MINOR HEMORRHOIDECTOMY  04/2014   thrombosed   [place] EXTRACTION  1996/1997   Current Outpatient Medications on File Prior to Visit  Medication Sig Dispense Refill   albuterol (PROVENTIL HFA;VENTOLIN HFA) 108 (90 Base) MCG/ACT inhaler Inhale 2 puffs into the lungs every 4 (four) hours as needed for wheezing or shortness of breath. (Patient not taking: Reported on 12/04/2019) 1 Inhaler 0   amoxicillin (AMOXIL) 875 MG tablet Take 1 tablet (875 mg total) by mouth 2 (two) times daily. 20 tablet 0   Blood Glucose Monitoring Suppl KIT Use to check fasting glucose daily ICD10: R73.01.Dispense based on insurance preference 1 kit 0   fluconazole (DIFLUCAN) 150 MG tablet Take 1 tablet then repeat in 3 days 2 tablet 1   glucose blood test strip Use with glucose meter to check blood sugar.ICD10: R73.01.Dispense based on insurance preference 100 each 12   JUNEL FE 1/20 1-20 MG-MCG tablet Take 1 tablet by mouth daily.     Lancets (FREESTYLE) lancets Use to check blood sugar ICD10: R73.01.Dispense based on insurance preference 100  each 12   levocetirizine (XYZAL) 5 MG tablet Take 5 mg by mouth every evening.     losartan (COZAAR) 25 MG tablet TAKE 1 TABLET BY MOUTH EVERY DAY 90 tablet 1   metFORMIN (GLUCOPHAGE-XR) 500 MG 24 hr tablet TAKE 2 TABLETS BY MOUTH EVERY DAY WITH BREAKFAST 180 tablet 3   pravastatin (PRAVACHOL) 20 MG tablet Take 1 tablet (20 mg total) by mouth daily. 90 tablet 3   predniSONE (DELTASONE) 10 MG tablet Take 97m x 2 days,249mx 2 days, 1024m 2 days 14 tablet 0   triamterene-hydrochlorothiazide (DYAZIDE) 37.5-25  MG capsule Take 1 capsule by mouth daily.      Current Facility-Administered Medications on File Prior to Visit  Medication Dose Route Frequency Provider Last Rate Last Admin   0.9 %  sodium chloride infusion  500 mL Intravenous Continuous GesGatha MayerD       Allergies  Allergen Reactions   Augmentin [Amoxicillin-Pot Clavulanate] Nausea And Vomiting    Vomiting    Ciprofloxacin Hcl     Arm turned red   Codeine Other (See Comments)    Makes pain worse   Erythromycin     Rash    Sulfur     rash   Social History   Socioeconomic History   Marital status: Married    Spouse name: Not on file   Number of children: Not on file   Years of education: Not on file   Highest education level: Not on file  Occupational History    Employer: [employer]   Smoking status: Never Smoker   Smokeless tobacco: Never Used  Vaping Use   Vaping Use: Never used  Substance and Sexual Activity   Alcohol use: No   Drug use: No   Sexual activity: Yes  Other Topics Concern   Not on file  Social History Narrative   Married, 2 sons and 1 daughter   [place] [employer] No caffeine   Social Determinants of Hea[employer] broadcast assistantrain:    Difficulty of Paying Living Expenses:   Food Insecurity:    Worried About Run[employer] fundraiser the Last Year:    RanArboriculturist the Last Year:   Transportation Needs:    Lac[employer]/video editoredical):    Lack of Transportation (Non-Medical):   Physical Activity:    Days of Exercise per Week:    Minutes of Exercise per Session:   Stress:    Feeling of Stress :   Social Connections:    Frequency of Communication with Friends and Family:    Frequency of Social Gatherings with Friends and Family:    Attends Religious Services:    Active Member of Clubs or Organizations:    Attends CluArchivistetings:    Marital Status:   Intimate  Partner Violence:    Fear of Current or Ex-Partner:    Emotionally Abused:    Physically Abused:    Sexually Abused:    Breast Cancer-relatedfamily history is not on file.   Observations/Objective: Able to speak full sentences, A/O x3.   Assessment and Plan: Acute maxillary sinusitis, recurrence not specified  Cough  Acute bronchitis, unspecified organism  H/O seasonal allergies For the cough Tessalon pearl RX, Albuterol inhaler for coughing spell/bronchospasm For sinusitis: allergies to antibiotics will cover with Doxycycline as pt prefers.  Seasonal allergies, increase Flonase two sprays daily for 5  days then resume previous dose, continue daily Zyzol   Follow Up Instructions:    I discussed the assessment and treatment plan with the patient. The patient was provided an opportunity to ask questions and all were answered. The patient agreed with the plan and demonstrated an understanding of the instructions.   The patient was advised to call back or seek an in-person evaluation if the symptoms worsen or if the condition fails to improve as anticipated.  I provided 15 minutes of non-face-to-face time during this encounter. END CALL 10:30  Annie Main, FNP

## 2020-03-04 ENCOUNTER — Other Ambulatory Visit: Payer: Self-pay | Admitting: Nurse Practitioner

## 2020-03-04 DIAGNOSIS — Z889 Allergy status to unspecified drugs, medicaments and biological substances status: Secondary | ICD-10-CM

## 2020-03-18 ENCOUNTER — Other Ambulatory Visit: Payer: Self-pay | Admitting: Nurse Practitioner

## 2020-03-18 DIAGNOSIS — Z889 Allergy status to unspecified drugs, medicaments and biological substances status: Secondary | ICD-10-CM

## 2020-04-15 ENCOUNTER — Encounter: Payer: Self-pay | Admitting: Family Medicine

## 2020-04-30 ENCOUNTER — Ambulatory Visit (INDEPENDENT_AMBULATORY_CARE_PROVIDER_SITE_OTHER): Payer: BC Managed Care – PPO | Admitting: *Deleted

## 2020-04-30 ENCOUNTER — Other Ambulatory Visit: Payer: Self-pay

## 2020-04-30 DIAGNOSIS — Z23 Encounter for immunization: Secondary | ICD-10-CM

## 2020-04-30 NOTE — Progress Notes (Signed)
Patient seen in office for Influenza Vaccination.   Patient requested HD Flu Shot as she is high risk. Advised that HD is only used per age >45yo.  Patient requested HD Flu shot anyway. Advised that her insurance may not cover injection and that price of injection is $ 47.11 and administration fee is $ 39. Patient is willing to pay out of pocket if not covered.   Tolerated IM administration well.   Immunization history updated.

## 2020-05-02 ENCOUNTER — Other Ambulatory Visit: Payer: Self-pay

## 2020-05-02 ENCOUNTER — Ambulatory Visit
Admission: RE | Admit: 2020-05-02 | Discharge: 2020-05-02 | Disposition: A | Discharge status: Home / Self Care | Payer: BC Managed Care – PPO | Source: Ambulatory Visit | Attending: Family Medicine | Admitting: Family Medicine

## 2020-05-02 VITALS — BP 137/83 | HR 100 | Temp 98.3°F | Resp 20

## 2020-05-02 DIAGNOSIS — K645 Perianal venous thrombosis: Secondary | ICD-10-CM

## 2020-05-02 DIAGNOSIS — K6289 Other specified diseases of anus and rectum: Secondary | ICD-10-CM

## 2020-05-02 NOTE — ED Triage Notes (Signed)
Pt presents with c/o hemorrhoid since Tuesday that she thinks may be thrombosed

## 2020-05-02 NOTE — Discharge Instructions (Signed)
Hemorrhoid lanced in office Perform sitz water baths Eat a diet high if fiber and drink plenty of water Continue with OTC medications as needed Follow up with PCP if symptoms persists Return or go to the ER if you have any new or worsening symptoms fever, chills, dizziness, lightheadedness, SOB, chest pain, nausea, vomiting, blood in stool, black stools, etc.

## 2020-05-02 NOTE — ED Provider Notes (Signed)
[facility]   [id number] 05/02/20 Arrival Time: 24  CC: Hemorrhoid  SUBJECTIVE:  Candice Jones is a 44 y.o. female who presents with complaint of hemorrhoid x 4 days ago.  Denies a precipitating event, trauma, straining, constipation or hard stool.  Localizes pain to rectum.  Has tried OTC medications without relief.  Worse with sitting.  Reports similar symptoms in the past that resolved with lancing.  Complains of nausea.    Denies fever, chills, vomiting, chest pain, SOB, diarrhea, constipation, hematochezia, melena, dysuria, difficulty urinating, increased frequency or urgency, flank pain, loss of bowel or bladder function.  No LMP recorded (lmp unknown). (Menstrual status: Oral contraceptives).  ROS: As per HPI.  All other pertinent ROS negative.     Past Medical History:  Diagnosis Date   Adenomyosis    Asthma    Gall stones    Gestational diabetes mellitus    Hyperlipidemia    IBS (irritable bowel syndrome)    Kidney stones    Meniere's syndrome    Vertigo    Past Surgical History:  Procedure Laterality Date   CESAREAN SECTION  08/1999, 05/2003   CHOLECYSTECTOMY  2011   CYSTECTOMY  1997   left wrist    ENDOMETRIAL ABLATION  01/2010   HEMORRHOID BANDING  2015   MINOR HEMORRHOIDECTOMY  04/2014   thrombosed   WISDOM TOOTH EXTRACTION  1996/1997   Allergies  Allergen Reactions   Augmentin [Amoxicillin-Pot Clavulanate] Nausea And Vomiting    Vomiting    Ciprofloxacin Hcl     Arm turned red   Codeine Other (See Comments)    Makes pain worse   Erythromycin     Rash    Sulfur     rash   Current Facility-Administered Medications on File Prior to Encounter  Medication Dose Route Frequency Provider Last Rate Last Admin   0.9 %  sodium chloride infusion  500 mL Intravenous Continuous Gatha Mayer, MD       Current Outpatient Medications on File Prior to Encounter  Medication Sig Dispense Refill   albuterol (VENTOLIN HFA) 108 (90  Base) MCG/ACT inhaler Inhale 2 puffs into the lungs every 4 (four) hours as needed for wheezing or shortness of breath. 18 g 0   Blood Glucose Monitoring Suppl KIT Use to check fasting glucose daily ICD10: R73.01.Dispense based on insurance preference 1 kit 0   fluticasone (FLONASE) 50 MCG/ACT nasal spray SPRAY 2 SPRAYS INTO EACH NOSTRIL EVERY DAY 48 mL 1   glucose blood test strip Use with glucose meter to check blood sugar.ICD10: R73.01.Dispense based on insurance preference 100 each 12   JUNEL FE 1/20 1-20 MG-MCG tablet Take 1 tablet by mouth daily.     Lancets (FREESTYLE) lancets Use to check blood sugar ICD10: R73.01.Dispense based on insurance preference 100 each 12   levocetirizine (XYZAL) 5 MG tablet Take 5 mg by mouth every evening.     losartan (COZAAR) 25 MG tablet TAKE 1 TABLET BY MOUTH EVERY DAY 90 tablet 1   metFORMIN (GLUCOPHAGE-XR) 500 MG 24 hr tablet TAKE 2 TABLETS BY MOUTH EVERY DAY WITH BREAKFAST 180 tablet 3   pravastatin (PRAVACHOL) 20 MG tablet Take 1 tablet (20 mg total) by mouth daily. 90 tablet 3   triamterene-hydrochlorothiazide (DYAZIDE) 37.5-25 MG capsule Take 1 capsule by mouth daily.      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 caffeine   Social Determinants of Radio broadcast assistant Strain:    Difficulty of Paying Living Expenses: Not on file  Food  Insecurity:    Worried About [place] in the Last Year: Not on file   Ran Out of Food in the Last Year: Not on file  Transportation Needs:    Lack of Transportation (Medical): Not on file   Lack of Transportation (Non-Medical): Not on file  Physical Activity:    Days of Exercise per Week: Not on file   Minutes of Exercise per Session: Not on file  Stress:    Feeling of Stress : Not on file  Social Connections:    Frequency of Communication with Friends and Family: Not on file   Frequency of Social Gatherings with Friends and Family: Not on file   Attends Religious Services: Not on file   Active Member of Clubs or Organizations: Not on file   Attends Archivist Meetings: Not on file   Marital Status: Not on file  Intimate Partner Violence:    Fear of Current or Ex-Partner: Not on file   Emotionally Abused: Not on file   Physically Abused: Not on file   Sexually Abused: Not on file   Family History  Problem Relation Age of Onset   High Cholesterol Mother    Hypertension Mother    Heart disease Mother    Diabetes Father    High Cholesterol Father    Colon cancer Sister        negative for Lynch syndrome   Diverticulitis Sister    Diabetes Maternal Grandmother    Heart disease Maternal Grandmother    Hypertension Maternal Grandmother    High Cholesterol Maternal Grandmother    Kidney cancer Maternal Grandmother    Heart disease Maternal Grandfather    High Cholesterol Maternal Grandfather    Cancer Maternal Grandfather    Diabetes Paternal Grandmother    Prostate cancer Paternal Grandfather    Lupus Sister      OBJECTIVE:  Vitals:   05/02/20 1155  BP: 137/83  Pulse: 100  Resp: 20  Temp: 98.3 F (36.8 C)  SpO2: 96%    General appearance: Alert; NAD HEENT: NCAT.  Oropharynx clear.  Lungs: clear to auscultation bilaterally without adventitious breath sounds Heart: regular rate and rhythm.   Abdomen: soft,  non-distended; normal active bowel sounds; non-tender to light and deep palpation; nontender at McBurney's point; negative Murphy's sign; negative rebound; no guarding Rectal: Jamey Reas RT present as chaperon: External exam positive for external thrombosed hemorrhoid in 12-3 o'clock position.  Residual hemorrhoid tag in 6 o'clock position.   Extremities: no edema; symmetrical with no gross deformities Skin: warm and dry Neurologic: normal gait Psychological: alert and cooperative; normal mood and affect  Procedure:  Verbal consent obtained. Area over hemorrhoid cleaned with betadine. Lidocaine 2%  with epinephrine used to obtain local anesthesia. The most fluctuant portion of the hemorrhoid was incised with a #11 blade scalpel. Hemorrhoid cavity explored and blood clot evacuated. Dressed with a clean gauze dressing. Minimal bleeding. No complications.  ASSESSMENT & PLAN:  1. Hemorrhoids, external, thrombosed   2. Rectal pain    Hemorrhoid lanced in office Perform sitz water baths Eat a diet high if fiber and drink plenty of water Continue with OTC medications as needed Follow up with PCP if symptoms persists Return or go to the ER if you have any new or worsening symptoms fever, chills, dizziness, lightheadedness, SOB, chest pain, nausea, vomiting, blood in stool, black stools, etc.    Reviewed expectations re: course of current medical issues. Questions answered. Outlined signs and symptoms indicating need for more acute intervention. Patient verbalized understanding. After Visit Summary given.   Lestine Box, PA-C 05/02/20 1259

## 2020-06-04 ENCOUNTER — Other Ambulatory Visit: Payer: Self-pay

## 2020-06-04 MED ORDER — LOSARTAN POTASSIUM 25 MG PO TABS: 25.0000 mg | ORAL_TABLET | Freq: Every day | ORAL | 1 refills | Status: DC

## 2020-07-27 ENCOUNTER — Encounter: Payer: Self-pay | Admitting: Family Medicine

## 2020-07-27 MED ORDER — FLUCONAZOLE 150 MG PO TABS
150.0000 mg | ORAL_TABLET | Freq: Once | ORAL | 0 refills | Status: AC
Start: 2020-07-27 — End: 2020-07-27

## 2020-08-14 ENCOUNTER — Other Ambulatory Visit: Payer: Self-pay

## 2020-08-14 DIAGNOSIS — Z20822 Contact with and (suspected) exposure to covid-19: Secondary | ICD-10-CM

## 2020-08-16 ENCOUNTER — Encounter: Payer: Self-pay | Admitting: Family Medicine

## 2020-08-16 LAB — SARS-COV-2, NAA 2 DAY TAT

## 2020-08-16 LAB — SPECIMEN STATUS REPORT

## 2020-08-16 LAB — NOVEL CORONAVIRUS, NAA: SARS-CoV-2, NAA: DETECTED — AB

## 2020-08-18 ENCOUNTER — Telehealth (INDEPENDENT_AMBULATORY_CARE_PROVIDER_SITE_OTHER): Payer: BC Managed Care – PPO | Admitting: Family Medicine

## 2020-08-18 ENCOUNTER — Other Ambulatory Visit: Payer: Self-pay

## 2020-08-18 ENCOUNTER — Ambulatory Visit: Payer: BC Managed Care – PPO | Admitting: Family Medicine

## 2020-08-18 DIAGNOSIS — U071 COVID-19: Secondary | ICD-10-CM | POA: Diagnosis not present

## 2020-08-18 MED ORDER — HYDROCOD POLST-CPM POLST ER 10-8 MG/5ML PO SUER: 5.0000 mL | Freq: Two times a day (BID) | ORAL | 0 refills | Status: DC | PRN

## 2020-08-18 NOTE — Progress Notes (Signed)
Subjective:    Patient ID: Candice Jones, female    DOB: [birth year], 45 y.o.   MRN: [id number]  HPI Patient is being seen as a phone visit.  She consents to be seen by phone.  She is at home.  I am at home.  Phone call began at 820.  Phone call concluded at 837.  Patient developed cold like symptoms Wed night.  Awoke Thursday morning with fever, headache, and severe sore throat.  Continues to have fever and chest congestion.   Occasional cough that keeps her awake at night.  Denies any sob, chest pain, pleurisy, or trouble breathing.  Mainly head congestion, scratchy throat, and pnd/cough.  Fully vaccinated. Past Medical History:  Diagnosis Date  . Adenomyosis   . Asthma   . Gall stones   . Gestational diabetes mellitus   . Hyperlipidemia   . IBS (irritable bowel syndrome)   . Kidney stones   . Meniere's syndrome   . Vertigo    Past Surgical History:  Procedure Laterality Date  . CESAREAN SECTION  08/1999, 05/2003  . CHOLECYSTECTOMY  2011  . CYSTECTOMY  1997   left wrist   . ENDOMETRIAL ABLATION  01/2010  . HEMORRHOID BANDING  2015  . MINOR HEMORRHOIDECTOMY  04/2014   thrombosed  . WISDOM TOOTH EXTRACTION  1996/1997   Current Outpatient Medications on File Prior to Visit  Medication Sig Dispense Refill  . albuterol (VENTOLIN HFA) 108 (90 Base) MCG/ACT inhaler Inhale 2 puffs into the lungs every 4 (four) hours as needed for wheezing or shortness of breath. 18 g 0  . Blood Glucose Monitoring Suppl KIT Use to check fasting glucose daily ICD10: R73.01.Dispense based on insurance preference 1 kit 0  . fluticasone (FLONASE) 50 MCG/ACT nasal spray SPRAY 2 SPRAYS INTO EACH NOSTRIL EVERY DAY 48 mL 1  . glucose blood test strip Use with glucose meter to check blood sugar.ICD10: R73.01.Dispense based on insurance preference 100 each 12  . JUNEL FE 1/20 1-20 MG-MCG tablet Take 1 tablet by mouth daily.    . Lancets (FREESTYLE) lancets Use to check blood sugar ICD10: R73.01.Dispense based on  insurance preference 100 each 12  . levocetirizine (XYZAL) 5 MG tablet Take 5 mg by mouth every evening.    Marland Kitchen losartan (COZAAR) 25 MG tablet Take 1 tablet (25 mg total) by mouth daily. 90 tablet 1  . metFORMIN (GLUCOPHAGE-XR) 500 MG 24 hr tablet TAKE 2 TABLETS BY MOUTH EVERY DAY WITH BREAKFAST 180 tablet 3  . pravastatin (PRAVACHOL) 20 MG tablet Take 1 tablet (20 mg total) by mouth daily. 90 tablet 3  . triamterene-hydrochlorothiazide (DYAZIDE) 37.5-25 MG capsule Take 1 capsule by mouth daily.      Current Facility-Administered Medications on File Prior to Visit  Medication Dose Route Frequency Provider Last Rate Last Admin  . 0.9 %  sodium chloride infusion  500 mL Intravenous Continuous Gatha Mayer, MD       Allergies  Allergen Reactions  . Augmentin [Amoxicillin-Pot Clavulanate] Nausea And Vomiting    Vomiting   . Ciprofloxacin Hcl     Arm turned red  . Codeine Other (See Comments)    Makes pain worse  . Elemental Sulfur     rash  . Erythromycin     Rash    Social History   Socioeconomic History  . Marital status: Married    Spouse name: Not on file  . Number of children: Not on file  . Years of education:  Not on file  . Highest education level: Not on file  Occupational History    Employer: [employer]  Tobacco Use  . Smoking status: Never Smoker  . Smokeless tobacco: Never Used  Vaping Use  . Vaping Use: Never used  Substance and Sexual Activity  . Alcohol use: No  . Drug use: No  . Sexual activity: Yes  Other Topics Concern  . 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  HENT: Positive for congestion, postnasal drip, rhinorrhea and sore throat. Negative for trouble swallowing.   Respiratory: Positive for cough. Negative for chest tightness, shortness of breath, wheezing and stridor.   Cardiovascular: Negative for chest pain.  Gastrointestinal: Negative for diarrhea, nausea and vomiting.       Objective:   Physical Exam        Assessment & Plan:  COVID-19  Symptomatic management only.  Use chloraseptic for sore throat.  Ibuprofen/tylenol for fever and pain.  Sudafed for congestion.  Mucinex for cough.  Did send in tussionex for cough if severe.  Go to er if sob or chest pain or trouble breathing.  Quarantine for 10 days and until fever subsides.

## 2020-08-23 ENCOUNTER — Encounter: Payer: Self-pay | Admitting: Family Medicine

## 2020-08-25 ENCOUNTER — Other Ambulatory Visit: Payer: Self-pay

## 2020-08-25 ENCOUNTER — Encounter: Payer: Self-pay | Admitting: Nurse Practitioner

## 2020-08-25 ENCOUNTER — Ambulatory Visit: Payer: BC Managed Care – PPO | Admitting: Nurse Practitioner

## 2020-08-25 VITALS — BP 132/80 | HR 117 | Temp 98.1°F

## 2020-08-25 DIAGNOSIS — J019 Acute sinusitis, unspecified: Secondary | ICD-10-CM | POA: Diagnosis not present

## 2020-08-25 DIAGNOSIS — B9689 Other specified bacterial agents as the cause of diseases classified elsewhere: Secondary | ICD-10-CM

## 2020-08-25 MED ORDER — GUAIFENESIN ER 600 MG PO TB12: 600.0000 mg | ORAL_TABLET | Freq: Two times a day (BID) | ORAL | 0 refills | Status: DC | PRN

## 2020-08-25 MED ORDER — AMOXICILLIN 500 MG PO CAPS: 500.0000 mg | ORAL_CAPSULE | Freq: Three times a day (TID) | ORAL | 0 refills | Status: DC

## 2020-08-25 NOTE — Patient Instructions (Signed)

## 2020-08-25 NOTE — Progress Notes (Signed)
Subjective:    Patient ID: Candice Jones, female    DOB: 1976/02/10, 45 y.o.   MRN: [id number]  HPI: Candice Jones is a 45 y.o. female presenting for ongoing COVID-19 symptoms.  Chief Complaint  Patient presents with  . Covid Positive   UPPER RESPIRATORY TRACT INFECTION Symptom onset: 08/12/2020 Positive test: 08/14/2020 Fever: yes; low grade last 99.9 Cough: yes; mostly dry; in the mornings with thick yellow sputum Shortness of breath: no Wheezing: no  Pain with inspiration: no Chest pain: no Chest tightness: no Chest congestion: no Nasal congestion: yes Runny nose: no Post nasal drip: yes Sneezing: yes Sore throat: no Swollen glands: yes Sinus pressure: yes Headache: yes Face pain: no Toothache: no Ear pain: no  Ear pressure: no  Eyes red/itching:no Eye drainage/crusting: no  Nausea: yes  Vomiting: no Diarrhea: yes  Hoarseness: yes Change in appetite: yes; decreased  Loss of taste/smell: no  Rash: yes; blister to left arm from smart watch Fatigue: yes Sick contacts: yes; is a Pharmacist, 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 Augmentin [Amoxicillin-Pot Clavulanate] Nausea And Vomiting    Vomiting   . Ciprofloxacin Hcl     Arm turned red  . Codeine Other (See Comments)    Makes pain worse  . Elemental Sulfur     rash  . Erythromycin     Rash     Outpatient Encounter Medications as of 08/25/2020  Medication Sig  . amoxicillin (AMOXIL) 500 MG capsule Take 1 capsule (500 mg total) by mouth 3 (three) times daily.  Marland Kitchen guaiFENesin (MUCINEX) 600 MG 12 hr tablet Take 1 tablet (600 mg total) by mouth 2 (two) times daily as needed for cough or to loosen phlegm.  . [DISCONTINUED] amoxicillin (AMOXIL)  500 MG capsule Take 1 capsule (500 mg total) by mouth 3 (three) times daily.  Marland Kitchen albuterol (VENTOLIN HFA) 108 (90 Base) MCG/ACT inhaler Inhale 2 puffs into the lungs every 4 (four) hours as needed for wheezing or shortness of breath.  . Blood Glucose Monitoring Suppl KIT Use to check fasting glucose daily ICD10: R73.01.Dispense based on insurance preference  . chlorpheniramine-HYDROcodone (TUSSIONEX PENNKINETIC ER) 10-8 MG/5ML SUER Take 5 mLs by mouth every 12 (twelve) hours as needed for cough.  . fluticasone (FLONASE) 50 MCG/ACT nasal spray SPRAY 2 SPRAYS INTO EACH NOSTRIL EVERY DAY  . glucose blood test strip Use with glucose meter to check blood sugar.ICD10: R73.01.Dispense based on insurance preference  . JUNEL FE 1/20 1-20 MG-MCG tablet Take 1 tablet by mouth daily.  . Lancets (FREESTYLE) lancets Use to check blood sugar ICD10: R73.01.Dispense based on insurance preference  . levocetirizine (XYZAL) 5 MG tablet Take 5 mg by mouth every evening.  Marland Kitchen losartan (COZAAR) 25 MG tablet Take 1 tablet (25 mg total) by mouth daily.  . metFORMIN (GLUCOPHAGE-XR) 500 MG 24 hr tablet TAKE 2 TABLETS BY MOUTH EVERY DAY WITH BREAKFAST  . pravastatin (PRAVACHOL) 20 MG tablet Take 1 tablet (20 mg total) by mouth daily.  Marland Kitchen triamterene-hydrochlorothiazide (DYAZIDE) 37.5-25 MG capsule Take 1 capsule by mouth daily.   . [DISCONTINUED] 0.9 %  sodium chloride infusion    No facility-administered encounter medications on file as of 08/25/2020.    Patient Active Problem List   Diagnosis Date Noted  .  Family history of colon cancer - sister < 60 02/02/2017  . Internal hemorrhoids with bleeding and prolapse 06/24/2014  . IBS (irritable bowel syndrome) 06/24/2014  . Endometriosis 03/03/2013  . Elevated fasting glucose 03/01/2013  . Gallstones 05/12/2011    Past Medical History:  Diagnosis Date  . Adenomyosis   . Asthma   . Gall stones   . Gestational diabetes mellitus   . Hyperlipidemia   . IBS (irritable  bowel syndrome)   . Kidney stones   . Meniere's syndrome   . Vertigo     Relevant past medical, surgical, family and social history reviewed and updated as indicated. Interim medical history since our last visit reviewed.  Review of Systems Per HPI unless specifically indicated above     Objective:    BP 132/80 (BP Location: Right Arm, Patient Position: Sitting, Cuff Size: Normal)   Pulse (!) 117   Temp 98.1 F (36.7 C) (Temporal)   SpO2 97%   Wt Readings from Last 3 Encounters:  12/04/19 196 lb (88.9 kg)  08/05/19 191 lb (86.6 kg)  05/21/19 191 lb (86.6 kg)    Physical Exam Vitals and nursing note reviewed.  Constitutional:      General: She is not in acute distress.    Appearance: Normal appearance. She is not toxic-appearing.  HENT:     Head: Normocephalic and atraumatic.     Right Ear: Ear canal and external ear normal. Tympanic membrane is erythematous. Tympanic membrane is not bulging.     Left Ear: Ear canal and external ear normal. Tympanic membrane is erythematous. Tympanic membrane is not bulging.     Nose: Congestion present. No rhinorrhea.     Right Turbinates: Swollen.     Left Turbinates: Swollen.     Right Sinus: Frontal sinus tenderness present. No maxillary sinus tenderness.     Left Sinus: Frontal sinus tenderness present. No maxillary sinus tenderness.     Mouth/Throat:     Mouth: Mucous membranes are moist.     Pharynx: Posterior oropharyngeal erythema present. No oropharyngeal exudate.  Eyes:     General: No scleral icterus.    Extraocular Movements: Extraocular movements intact.  Cardiovascular:     Rate and Rhythm: Regular rhythm. Tachycardia present.     Heart sounds: Normal heart sounds. No murmur heard.   Pulmonary:     Effort: Pulmonary effort is normal. No respiratory distress.     Breath sounds: Normal breath sounds. No wheezing, rhonchi or rales.  Abdominal:     General: Abdomen is flat. Bowel sounds are normal. There is no  distension.  Musculoskeletal:     Cervical back: Neck supple. No tenderness.  Lymphadenopathy:     Cervical: Cervical adenopathy present.  Skin:    General: Skin is warm and dry.     Coloration: Skin is not jaundiced or pale.     Findings: No erythema.  Neurological:     Mental Status: She is alert and oriented to person, place, and time.     Motor: No weakness.  Psychiatric:        Mood and Affect: Mood normal.        Behavior: Behavior normal.        Thought Content: Thought content normal.        Judgment: Judgment normal.     Results for orders placed or performed in visit on 08/14/20  Novel Coronavirus, NAA ([facility])   Specimen: Nasopharyngeal(NP) swabs in vial transport medium   Nasopharynge  Result  Value Ref Range   SARS-CoV-2, NAA Detected (A) Not Detected  SARS-COV-2, NAA 2 DAY TAT   Nasopharynge  Result Value Ref Range   SARS-CoV-2, NAA 2 DAY TAT Performed   Specimen status report  Result Value Ref Range   specimen status report Comment       Assessment & Plan:  1. Acute bacterial sinusitis Acute, ongoing.  Likely secondary to COVID-19 infection.  Will treat with amoxicillin three times daily x 7 days.  Also start guaifenesin around the clock to help thin secretions.  Push fluids.  Symptoms should gradually improve over next week, however post-viral cough may linger for up to 6 weeks after acute infection.  Note for work given.    - guaiFENesin (MUCINEX) 600 MG 12 hr tablet; Take 1 tablet (600 mg total) by mouth 2 (two) times daily as needed for cough or to loosen phlegm.  Dispense: 30 tablet; Refill: 0 - amoxicillin (AMOXIL) 500 MG capsule; Take 1 capsule (500 mg total) by mouth 3 (three) times daily.  Dispense: 21 capsule; Refill: 0   Follow up plan: Return if symptoms worsen or fail to improve.

## 2020-08-27 ENCOUNTER — Encounter: Payer: Self-pay | Admitting: Nurse Practitioner

## 2020-08-27 DIAGNOSIS — B9689 Other specified bacterial agents as the cause of diseases classified elsewhere: Secondary | ICD-10-CM

## 2020-08-27 DIAGNOSIS — J019 Acute sinusitis, unspecified: Secondary | ICD-10-CM

## 2020-08-27 MED ORDER — AMOXICILLIN 500 MG PO CAPS: 500.0000 mg | ORAL_CAPSULE | Freq: Three times a day (TID) | ORAL | 0 refills | Status: DC

## 2020-08-27 MED ORDER — FLUCONAZOLE 150 MG PO TABS: 150.0000 mg | ORAL_TABLET | Freq: Once | ORAL | 0 refills | Status: AC

## 2020-08-28 ENCOUNTER — Encounter: Payer: Self-pay | Admitting: Family Medicine

## 2020-09-05 ENCOUNTER — Ambulatory Visit
Admission: RE | Admit: 2020-09-05 | Discharge: 2020-09-05 | Disposition: A | Discharge status: Home / Self Care | Payer: BC Managed Care – PPO | Source: Ambulatory Visit | Attending: Family Medicine | Admitting: Family Medicine

## 2020-09-05 ENCOUNTER — Ambulatory Visit (INDEPENDENT_AMBULATORY_CARE_PROVIDER_SITE_OTHER): Payer: BC Managed Care – PPO

## 2020-09-05 ENCOUNTER — Other Ambulatory Visit: Payer: Self-pay

## 2020-09-05 VITALS — BP 118/81 | HR 119 | Temp 98.7°F | Resp 18

## 2020-09-05 DIAGNOSIS — R0602 Shortness of breath: Secondary | ICD-10-CM | POA: Diagnosis not present

## 2020-09-05 DIAGNOSIS — R5383 Other fatigue: Secondary | ICD-10-CM | POA: Diagnosis not present

## 2020-09-05 DIAGNOSIS — R6883 Chills (without fever): Secondary | ICD-10-CM

## 2020-09-05 DIAGNOSIS — R509 Fever, unspecified: Secondary | ICD-10-CM

## 2020-09-05 DIAGNOSIS — R059 Cough, unspecified: Secondary | ICD-10-CM

## 2020-09-05 DIAGNOSIS — R000 Tachycardia, unspecified: Secondary | ICD-10-CM

## 2020-09-05 DIAGNOSIS — J069 Acute upper respiratory infection, unspecified: Secondary | ICD-10-CM

## 2020-09-05 DIAGNOSIS — R519 Headache, unspecified: Secondary | ICD-10-CM

## 2020-09-05 DIAGNOSIS — R52 Pain, unspecified: Secondary | ICD-10-CM

## 2020-09-05 LAB — POCT INFLUENZA A/B
Influenza A, POC: NEGATIVE
Influenza B, POC: NEGATIVE

## 2020-09-05 NOTE — Discharge Instructions (Addendum)
Chest xray shows no pneumonia today  Flu test in office today is negative  We will check labs today to rule out other causes of symptoms  We will be in contact with you about abnormal results  I think that you have contracted another virus right after Covid, this could also still be lingering Covid  Follow up with this office or with primary care if symptoms are persisting.  Follow up in the ER for high fever, trouble swallowing, trouble breathing, other concerning symptoms.

## 2020-09-05 NOTE — ED Triage Notes (Signed)
covid positive on 08/14/2020, finished amoxicillin yesterday.  States she woke up this morning with chills, body aches.

## 2020-09-05 NOTE — ED Provider Notes (Signed)
[place]   [id number] 09/05/20 Arrival Time: 8295   CC: COVID symptoms  SUBJECTIVE: History from: patient.  Candice Jones is a 45 y.o. female who presents with cough, SOB, tachycardia, fatigue, fever, body aches, chills for the last 2 days. Reports that she recently had Covid and then was treated for a secondary infection with amoxicillin and finished this yesterday. Reports that her symptoms never completely resolved but have worsened over the last 24 hours. Medical hx of diabetes, HLD, asthma, meniere's disease and IBS. Has completed Covid vaccines and booster. Has not taken OTC medications for this. Symptoms are aggravated with activity. Denies sinus pain, rhinorrhea, sore throat, wheezing, chest pain, nausea, changes in bowel or bladder habits.    ROS: As per HPI.  All other pertinent ROS negative.     Past Medical History:  Diagnosis Date  . Adenomyosis   . Asthma   . Gall stones   . Gestational diabetes mellitus   . Hyperlipidemia   . IBS (irritable bowel syndrome)   . Kidney stones   . Meniere's syndrome   . Vertigo    Past Surgical History:  Procedure Laterality Date  . CESAREAN SECTION  08/1999, 05/2003  . CHOLECYSTECTOMY  2011  . CYSTECTOMY  1997   left wrist   . ENDOMETRIAL ABLATION  01/2010  . HEMORRHOID BANDING  2015  . MINOR HEMORRHOIDECTOMY  04/2014   thrombosed  . WISDOM TOOTH EXTRACTION  1996/1997   Allergies  Allergen Reactions  . Augmentin [Amoxicillin-Pot Clavulanate] Nausea And Vomiting    Vomiting   . Ciprofloxacin Hcl     Arm turned red  . Codeine Other (See Comments)    Makes pain worse  . Elemental Sulfur     rash  . Erythromycin     Rash    No current facility-administered medications on file prior to encounter.   Current Outpatient Medications on File Prior to Encounter  Medication Sig Dispense Refill  . albuterol (VENTOLIN HFA) 108 (90 Base) MCG/ACT inhaler Inhale 2 puffs into the lungs every 4 (four) hours as needed for  wheezing or shortness of breath. 18 g 0  . amoxicillin (AMOXIL) 500 MG capsule Take 1 capsule (500 mg total) by mouth 3 (three) times daily. 21 capsule 0  . Blood Glucose Monitoring Suppl KIT Use to check fasting glucose daily ICD10: R73.01.Dispense based on insurance preference 1 kit 0  . chlorpheniramine-HYDROcodone (TUSSIONEX PENNKINETIC ER) 10-8 MG/5ML SUER Take 5 mLs by mouth every 12 (twelve) hours as needed for cough. 140 mL 0  . fluticasone (FLONASE) 50 MCG/ACT nasal spray SPRAY 2 SPRAYS INTO EACH NOSTRIL EVERY DAY 48 mL 1  . glucose blood test strip Use with glucose meter to check blood sugar.ICD10: R73.01.Dispense based on insurance preference 100 each 12  . guaiFENesin (MUCINEX) 600 MG 12 hr tablet Take 1 tablet (600 mg total) by mouth 2 (two) times daily as needed for cough or to loosen phlegm. 30 tablet 0  . JUNEL FE 1/20 1-20 MG-MCG tablet Take 1 tablet by mouth daily.    . Lancets (FREESTYLE) lancets Use to check blood sugar ICD10: R73.01.Dispense based on insurance preference 100 each 12  . levocetirizine (XYZAL) 5 MG tablet Take 5 mg by mouth every evening.    Marland Kitchen losartan (COZAAR) 25 MG tablet Take 1 tablet (25 mg total) by mouth daily. 90 tablet 1  . metFORMIN (GLUCOPHAGE-XR) 500 MG 24 hr tablet TAKE 2 TABLETS BY MOUTH EVERY DAY WITH BREAKFAST 180 tablet  3  . pravastatin (PRAVACHOL) 20 MG tablet Take 1 tablet (20 mg total) by mouth daily. 90 tablet 3  . triamterene-hydrochlorothiazide (DYAZIDE) 37.5-25 MG capsule Take 1 capsule by mouth daily.      Social History   Socioeconomic History  . Marital status: Married    Spouse name: Not on file  . Number of children: Not on file  . Years of education: Not on file  . Highest education level: Not on file  Occupational History    Employer: [employer]  Tobacco Use  . Smoking status: Never Smoker  . Smokeless tobacco: Never Used  Vaping Use  . Vaping Use: Never used  Substance and Sexual Activity  . Alcohol use: No   . Drug use: No  . Sexual activity: Yes  Other Topics Concern  . 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   No caffeine   Social Determinants of Radio broadcast assistant Strain: Not on file  Food Insecurity: Not on file  Transportation Needs: Not on file  Physical Activity: Not on file  Stress: Not on file  Social Connections: Not on file  Intimate Partner Violence: Not on file   Family History  Problem Relation Age of Onset  . High Cholesterol Mother   . Hypertension Mother   . Heart disease Mother   . Diabetes Father   . High Cholesterol Father   . Colon cancer Sister        negative for Lynch syndrome  . Diverticulitis Sister   . Diabetes Maternal Grandmother   . Heart disease Maternal Grandmother   . Hypertension Maternal Grandmother   . High Cholesterol Maternal Grandmother   . Kidney cancer Maternal Grandmother   . Heart disease Maternal Grandfather   . High Cholesterol Maternal Grandfather   . Cancer Maternal Grandfather   . Diabetes Paternal Grandmother   . Prostate cancer Paternal Grandfather   . Lupus Sister     OBJECTIVE:  Vitals:   09/05/20 0956  BP: 118/81  Pulse: (!) 119  Resp: 18  Temp: 98.7 F (37.1 C)  TempSrc: Oral  SpO2: 96%     General appearance: alert; appears fatigued, but nontoxic; speaking in full sentences and tolerating own secretions HEENT: NCAT; Ears: EACs clear, TMs pearly gray; Eyes: PERRL.  EOM grossly intact. Sinuses: nontender; Nose: nares patent with clear rhinorrhea, Throat: oropharynx erythematous, cobblestoning present, tonsils non erythematous or enlarged, uvula midline  Neck: supple with LAD Lungs: unlabored respirations, symmetrical air entry; cough: mild; no respiratory distress; diminished lung sounds throughout, coarse lung sounds to bilateral upper lobes Heart: regular rate and rhythm.  Radial pulses 2+ symmetrical  bilaterally Skin: warm and dry Psychological: alert and cooperative; normal mood and affect  LABS:  Results for orders placed or performed during the hospital encounter of 09/05/20 (from the past 24 hour(s))  POCT Influenza A/B     Status: None   Collection Time: 09/05/20 10:18 AM  Result Value Ref Range   Influenza A, POC Negative Negative   Influenza B, POC Negative Negative     ASSESSMENT & PLAN:  1. URI with cough and congestion   2. Fever, unspecified fever cause   3. Other fatigue   4. Chills   5. Body aches   6. Nonintractable headache, unspecified chronicity pattern, unspecified headache type   7. Tachycardia    Flu test in office  today is negative Chest xray shows no pneumonia today CBC, CMP drawn today Will inform of abnormal results as needed and treat accordingly Appears to be viral at this time since symptoms got better and now are worse again, and that the antibiotic did not really help. Continue supportive care at home  Work note provided Get plenty of rest and push fluids Use OTC zyrtec for nasal congestion, runny nose, and/or sore throat Use OTC flonase for nasal congestion and runny nose Use medications daily for symptom relief Use OTC medications like ibuprofen or tylenol as needed fever or pain Call or go to the ED if you have any new or worsening symptoms such as fever, worsening cough, shortness of breath, chest tightness, chest pain, turning blue, changes in mental status.  Reviewed expectations re: course of current medical issues. Questions answered. Outlined signs and symptoms indicating need for more acute intervention. Patient verbalized understanding. After Visit Summary given.         Faustino Congress, NP 09/05/20 1459

## 2020-09-06 LAB — COMPREHENSIVE METABOLIC PANEL
ALT: 34 IU/L — ABNORMAL HIGH (ref 0–32)
AST: 35 IU/L (ref 0–40)
Albumin/Globulin Ratio: 1.7 (ref 1.2–2.2)
Albumin: 4.1 g/dL (ref 3.8–4.8)
Alkaline Phosphatase: 77 IU/L (ref 44–121)
BUN/Creatinine Ratio: 11 (ref 9–23)
BUN: 7 mg/dL (ref 6–24)
Bilirubin Total: 0.3 mg/dL (ref 0.0–1.2)
CO2: 22 mmol/L (ref 20–29)
Calcium: 8.7 mg/dL (ref 8.7–10.2)
Chloride: 99 mmol/L (ref 96–106)
Creatinine, Ser: 0.61 mg/dL (ref 0.57–1.00)
GFR calc Af Amer: 127 mL/min/{1.73_m2} (ref >59)
GFR calc non Af Amer: 110 mL/min/{1.73_m2} (ref >59)
Globulin, Total: 2.4 g/dL (ref 1.5–4.5)
Glucose: 172 mg/dL — ABNORMAL HIGH (ref 65–99)
Potassium: 3.4 mmol/L — ABNORMAL LOW (ref 3.5–5.2)
Sodium: 138 mmol/L (ref 134–144)
Total Protein: 6.5 g/dL (ref 6.0–8.5)

## 2020-09-06 LAB — CBC WITH DIFFERENTIAL/PLATELET
Basophils Absolute: 0 10*3/uL (ref 0.0–0.2)
Basos: 0 %
EOS (ABSOLUTE): 0 10*3/uL (ref 0.0–0.4)
Eos: 0 %
Hematocrit: 44.1 % (ref 34.0–46.6)
Hemoglobin: 15.4 g/dL (ref 11.1–15.9)
Immature Grans (Abs): 0 10*3/uL (ref 0.0–0.1)
Immature Granulocytes: 0 %
Lymphocytes Absolute: 1.6 10*3/uL (ref 0.7–3.1)
Lymphs: 24 %
MCH: 29.7 pg (ref 26.6–33.0)
MCHC: 34.9 g/dL (ref 31.5–35.7)
MCV: 85 fL (ref 79–97)
Monocytes Absolute: 0.7 10*3/uL (ref 0.1–0.9)
Monocytes: 10 %
Neutrophils Absolute: 4.4 10*3/uL (ref 1.4–7.0)
Neutrophils: 66 %
Platelets: 309 10*3/uL (ref 150–450)
RBC: 5.18 x10E6/uL (ref 3.77–5.28)
RDW: 12.8 % (ref 11.7–15.4)
WBC: 6.7 10*3/uL (ref 3.4–10.8)

## 2020-09-07 ENCOUNTER — Encounter: Payer: Self-pay | Admitting: Family Medicine

## 2020-09-10 ENCOUNTER — Other Ambulatory Visit: Payer: Self-pay | Admitting: Family Medicine

## 2020-09-10 ENCOUNTER — Encounter: Payer: Self-pay | Admitting: Family Medicine

## 2020-09-10 ENCOUNTER — Telehealth: Payer: Self-pay | Admitting: Family Medicine

## 2020-09-10 ENCOUNTER — Ambulatory Visit (INDEPENDENT_AMBULATORY_CARE_PROVIDER_SITE_OTHER): Payer: BC Managed Care – PPO | Admitting: Family Medicine

## 2020-09-10 ENCOUNTER — Other Ambulatory Visit: Payer: Self-pay

## 2020-09-10 VITALS — BP 128/84 | HR 128 | Temp 98.5°F | Resp 18 | Ht 65.0 in | Wt 187.0 lb

## 2020-09-10 DIAGNOSIS — R7989 Other specified abnormal findings of blood chemistry: Secondary | ICD-10-CM

## 2020-09-10 DIAGNOSIS — R071 Chest pain on breathing: Secondary | ICD-10-CM

## 2020-09-10 DIAGNOSIS — R000 Tachycardia, unspecified: Secondary | ICD-10-CM

## 2020-09-10 DIAGNOSIS — U071 COVID-19: Secondary | ICD-10-CM | POA: Diagnosis not present

## 2020-09-10 LAB — CBC WITH DIFFERENTIAL/PLATELET
Absolute Monocytes: 377 cells/uL (ref 200–950)
Basophils Absolute: 41 cells/uL (ref 0–200)
Basophils Relative: 0.9 %
Eosinophils Absolute: 9 cells/uL — ABNORMAL LOW (ref 15–500)
Eosinophils Relative: 0.2 %
HCT: 46.2 % — ABNORMAL HIGH (ref 35.0–45.0)
Hemoglobin: 16.7 g/dL — ABNORMAL HIGH (ref 11.7–15.5)
Lymphs Abs: 1555 cells/uL (ref 850–3900)
MCH: 30.3 pg (ref 27.0–33.0)
MCHC: 36.1 g/dL — ABNORMAL HIGH (ref 32.0–36.0)
MCV: 83.7 fL (ref 80.0–100.0)
MPV: 9.7 fL (ref 7.5–12.5)
Monocytes Relative: 8.2 %
Neutro Abs: 2617 cells/uL (ref 1500–7800)
Neutrophils Relative %: 56.9 %
Platelets: 222 10*3/uL (ref 140–400)
RBC: 5.52 10*6/uL — ABNORMAL HIGH (ref 3.80–5.10)
RDW: 12.8 % (ref 11.0–15.0)
Total Lymphocyte: 33.8 %
WBC: 4.6 10*3/uL (ref 3.8–10.8)

## 2020-09-10 LAB — COMPLETE METABOLIC PANEL WITH GFR
AG Ratio: 1.3 (calc) (ref 1.0–2.5)
ALT: 61 U/L — ABNORMAL HIGH (ref 6–29)
AST: 51 U/L — ABNORMAL HIGH (ref 10–35)
Albumin: 4.1 g/dL (ref 3.6–5.1)
Alkaline phosphatase (APISO): 112 U/L (ref 31–125)
BUN: 9 mg/dL (ref 7–25)
CO2: 23 mmol/L (ref 20–32)
Calcium: 9.2 mg/dL (ref 8.6–10.2)
Chloride: 101 mmol/L (ref 98–110)
Creat: 0.6 mg/dL (ref 0.50–1.10)
GFR, Est African American: 128 mL/min/{1.73_m2} (ref >=60)
GFR, Est Non African American: 110 mL/min/{1.73_m2} (ref >=60)
Globulin: 3.1 g/dL (calc) (ref 1.9–3.7)
Glucose, Bld: 171 mg/dL — ABNORMAL HIGH (ref 65–99)
Potassium: 3.8 mmol/L (ref 3.5–5.3)
Sodium: 137 mmol/L (ref 135–146)
Total Bilirubin: 0.6 mg/dL (ref 0.2–1.2)
Total Protein: 7.2 g/dL (ref 6.1–8.1)

## 2020-09-10 LAB — D-DIMER, QUANTITATIVE: D-Dimer, Quant: 1.19 mcg/mL FEU — ABNORMAL HIGH (ref <0.50)

## 2020-09-10 NOTE — Telephone Encounter (Signed)
Pt is schedule for a CT scan, but the order must be expedited or emergently put in for insurance purposes. Please call back if this can be done.  Cb#: [phone]

## 2020-09-10 NOTE — Telephone Encounter (Signed)
CTA is ordered STAT and scheduled for 8am on 09/11/2020.  Advised patient that this is the earliest imaging that can be done.

## 2020-09-10 NOTE — Progress Notes (Signed)
Subjective:    Patient ID: Candice Jones, female    DOB: 1976/04/30, 45 y.o.   MRN: [id number]  HPI  Patient is a 45 year old Caucasian female who was diagnosed with COVID January 14.  She went back to work last week although she was still having low-grade temperatures 99+.  On Thursday of last week, she became acutely ill.  She developed fevers greater than 100, headache, sinus pressure.  She now has cough and shortness of breath and also tachycardia.  She states that she feels that her heart is racing.  She states that she gets easily winded with minimal activity.  She states that she took a shower this morning prior to coming to this appointment and then had to take a break.  She feels worse than when she had COVID.  She reports diffuse body aches all over especially a headache.  The headache seems to be centered around her frontal and maxillary sinus areas however I am concerned by her resting tachycardia of a heart rate of 128 bpm.  Other than the tachycardia, her exam is significant only for some sinus tenderness Past Medical History:  Diagnosis Date  . Adenomyosis   . Asthma   . Gall stones   . Gestational diabetes mellitus   . Hyperlipidemia   . IBS (irritable bowel syndrome)   . Kidney stones   . Meniere's syndrome   . Vertigo    Past Surgical History:  Procedure Laterality Date  . CESAREAN SECTION  08/1999, 05/2003  . CHOLECYSTECTOMY  2011  . CYSTECTOMY  1997   left wrist   . ENDOMETRIAL ABLATION  01/2010  . HEMORRHOID BANDING  2015  . MINOR HEMORRHOIDECTOMY  04/2014   thrombosed  . WISDOM TOOTH EXTRACTION  1996/1997   Current Outpatient Medications on File Prior to Visit  Medication Sig Dispense Refill  . albuterol (VENTOLIN HFA) 108 (90 Base) MCG/ACT inhaler Inhale 2 puffs into the lungs every 4 (four) hours as needed for wheezing or shortness of breath. 18 g 0  . Blood Glucose Monitoring Suppl KIT Use to check fasting glucose daily ICD10: R73.01.Dispense based on insurance  preference 1 kit 0  . chlorpheniramine-HYDROcodone (TUSSIONEX PENNKINETIC ER) 10-8 MG/5ML SUER Take 5 mLs by mouth every 12 (twelve) hours as needed for cough. 140 mL 0  . fluticasone (FLONASE) 50 MCG/ACT nasal spray SPRAY 2 SPRAYS INTO EACH NOSTRIL EVERY DAY 48 mL 1  . glucose blood test strip Use with glucose meter to check blood sugar.ICD10: R73.01.Dispense based on insurance preference 100 each 12  . guaiFENesin (MUCINEX) 600 MG 12 hr tablet Take 1 tablet (600 mg total) by mouth 2 (two) times daily as needed for cough or to loosen phlegm. 30 tablet 0  . JUNEL FE 1/20 1-20 MG-MCG tablet Take 1 tablet by mouth daily.    . Lancets (FREESTYLE) lancets Use to check blood sugar ICD10: R73.01.Dispense based on insurance preference 100 each 12  . levocetirizine (XYZAL) 5 MG tablet Take 5 mg by mouth every evening.    Marland Kitchen losartan (COZAAR) 25 MG tablet Take 1 tablet (25 mg total) by mouth daily. 90 tablet 1  . metFORMIN (GLUCOPHAGE-XR) 500 MG 24 hr tablet TAKE 2 TABLETS BY MOUTH EVERY DAY WITH BREAKFAST 180 tablet 3  . pravastatin (PRAVACHOL) 20 MG tablet Take 1 tablet (20 mg total) by mouth daily. 90 tablet 3  . triamterene-hydrochlorothiazide (DYAZIDE) 37.5-25 MG capsule Take 1 capsule by mouth daily.      No current  facility-administered medications on file prior to visit.   Allergies  Allergen Reactions  . Augmentin [Amoxicillin-Pot Clavulanate] Nausea And Vomiting    Vomiting   . Ciprofloxacin Hcl     Arm turned red  . Codeine Other (See Comments)    Makes pain worse  . Elemental Sulfur     rash  . Erythromycin     Rash    Social History   Socioeconomic History  . Marital status: Married    Spouse name: Not on file  . Number of children: Not on file  . Years of education: Not on file  . Highest education level: Not on file  Occupational History    Employer: [employer]  Tobacco Use  . Smoking status: Never Smoker  . Smokeless tobacco: Never Used  Vaping Use  .  Vaping Use: Never used  Substance and Sexual Activity  . Alcohol use: No  . Drug use: No  . Sexual activity: Yes  Other Topics Concern  . 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   All other systems reviewed and are negative.      Objective:   Physical Exam Vitals reviewed.  Constitutional:      General: She is not in acute distress.    Appearance: She is well-developed. She is not diaphoretic.  HENT:     Right Ear: Tympanic membrane and ear canal normal.     Left Ear: Tympanic membrane and ear canal normal.     Nose: Congestion and rhinorrhea present.     Right Sinus: Maxillary sinus tenderness and frontal sinus tenderness present.     Left Sinus: Maxillary sinus tenderness and frontal sinus tenderness present.  Cardiovascular:     Rate and Rhythm: Regular rhythm. Tachycardia present.     Heart sounds: Normal heart sounds.  Pulmonary:     Effort: Pulmonary effort is normal. No respiratory distress.     Breath sounds: Normal breath sounds. No stridor. No wheezing.  Musculoskeletal:     Right lower leg: No edema.     Left lower leg: No edema.           Assessment & Plan:  COVID-19 - Plan: COMPLETE METABOLIC PANEL WITH GFR, D-dimer, quantitative (not at [facility]), CBC with Differential/Platelet, CANCELED: CBC with Differential/Platelet  Sinus tachycardia - Plan: COMPLETE METABOLIC PANEL WITH GFR, D-dimer, quantitative (not at [facility] CBC with Differential/Platelet, CANCELED: CBC with Differential/Platelet  Patient has profound sinus tachycardia along  with body aches and shortness of breath.  This occurred after Covid.  I will send a stat D-dimer.  I am concerned about a pulmonary embolism.  Was in the emergency room over the weekend where a chest x-ray and lab work was unremarkable.  Lungs today are clear however she does have profound tachycardia and she does not appear dehydrated and she denies anxiety.  Await the results of the D-dimer.  The other possibility is she is developing a bacterial sinus infection or early pneumonia.  Therefore if D-dimer is negative, I  would likely start the patient on Levaquin.

## 2020-09-11 ENCOUNTER — Ambulatory Visit
Admission: RE | Admit: 2020-09-11 | Discharge: 2020-09-11 | Disposition: A | Discharge status: Home / Self Care | Payer: BC Managed Care – PPO | Source: Ambulatory Visit | Attending: Family Medicine | Admitting: Family Medicine

## 2020-09-11 ENCOUNTER — Encounter: Payer: Self-pay | Admitting: Family Medicine

## 2020-09-11 DIAGNOSIS — R000 Tachycardia, unspecified: Secondary | ICD-10-CM

## 2020-09-11 DIAGNOSIS — R071 Chest pain on breathing: Secondary | ICD-10-CM

## 2020-09-11 DIAGNOSIS — R7989 Other specified abnormal findings of blood chemistry: Secondary | ICD-10-CM

## 2020-09-11 MED ORDER — IOPAMIDOL (ISOVUE-370) INJECTION 76%
75.0000 mL | Freq: Once | INTRAVENOUS | Status: AC | PRN
  Administered 2020-09-11: 75 mL via INTRAVENOUS

## 2020-09-14 ENCOUNTER — Encounter: Payer: Self-pay | Admitting: Family Medicine

## 2020-09-14 ENCOUNTER — Other Ambulatory Visit: Payer: Self-pay | Admitting: Family Medicine

## 2020-09-22 ENCOUNTER — Ambulatory Visit (INDEPENDENT_AMBULATORY_CARE_PROVIDER_SITE_OTHER): Payer: BC Managed Care – PPO | Admitting: Family Medicine

## 2020-09-22 ENCOUNTER — Encounter: Payer: Self-pay | Admitting: Family Medicine

## 2020-09-22 ENCOUNTER — Other Ambulatory Visit: Payer: Self-pay

## 2020-09-22 VITALS — BP 118/78 | HR 97 | Temp 97.8°F | Ht 65.0 in | Wt 188.0 lb

## 2020-09-22 DIAGNOSIS — E7849 Other hyperlipidemia: Secondary | ICD-10-CM

## 2020-09-22 DIAGNOSIS — R000 Tachycardia, unspecified: Secondary | ICD-10-CM | POA: Diagnosis not present

## 2020-09-22 DIAGNOSIS — Z0001 Encounter for general adult medical examination with abnormal findings: Secondary | ICD-10-CM | POA: Diagnosis not present

## 2020-09-22 DIAGNOSIS — Z0000 Encounter for general adult medical examination without abnormal findings: Secondary | ICD-10-CM

## 2020-09-22 DIAGNOSIS — E119 Type 2 diabetes mellitus without complications: Secondary | ICD-10-CM

## 2020-09-22 NOTE — Progress Notes (Signed)
Subjective:    Patient ID: Candice Jones, female    DOB: [birth year], 45 y.o.   MRN: [id number]  HPI  Patient is a 45 year old Caucasian female here today for complete physical exam.  Has recently recovered from Covid and continues to have tachycardia and fatigue.  D-dimer was elevated.  CT scan showed no evidence of pulmonary embolism.  Cough has improved.  Shortness of breath has improved.  Still has some residual tachycardia at 100 bpm.  Still complains of severe fatigue.  Denies any fevers or chills.  Saw her gynecologist yesterday who performed her Pap smear and mammogram.  Colonoscopy is not due till next year.  Has her eye doctor appointment scheduled for later this month.  Diabetic foot exam was performed today and is normal.  Immunizations are up-to-date. Immunization History  Administered Date(s) Administered  . Fluad Quad(high Dose 65+) 04/30/2020  . Hep A / Hep B 05/14/2018  . Influenza Nasal 05/23/2005, 06/13/2006  . Influenza,inj,Quad PF,6+ Mos 04/23/2013, 05/28/2014, 04/30/2015, 05/08/2017, 05/09/2018, 05/21/2019  . Influenza-Unspecified 06/21/2001, 06/24/2002, 05/31/2004, 03/01/2012  . PFIZER(Purple Top)SARS-COV-2 Vaccination 10/05/2019, 10/26/2019, 04/01/2020  . Pneumococcal Polysaccharide-23 05/21/2019  . Td 06/28/2011  . Tdap 03/02/2011, 06/28/2011    Past Medical History:  Diagnosis Date  . Adenomyosis   . Asthma   . Gall stones   . Gestational diabetes mellitus   . Hyperlipidemia   . IBS (irritable bowel syndrome)   . Kidney stones   . Meniere's syndrome   . Vertigo    Past Surgical History:  Procedure Laterality Date  . CESAREAN SECTION  08/1999, 05/2003  . CHOLECYSTECTOMY  2011  . CYSTECTOMY  1997   left wrist   . ENDOMETRIAL ABLATION  01/2010  . HEMORRHOID BANDING  2015  . MINOR HEMORRHOIDECTOMY  04/2014   thrombosed  . WISDOM TOOTH EXTRACTION  1996/1997   Current Outpatient Medications on File Prior to Visit  Medication Sig Dispense Refill  . albuterol  (VENTOLIN HFA) 108 (90 Base) MCG/ACT inhaler Inhale 2 puffs into the lungs every 4 (four) hours as needed for wheezing or shortness of breath. 18 g 0  . Blood Glucose Monitoring Suppl KIT Use to check fasting glucose daily ICD10: R73.01.Dispense based on insurance preference 1 kit 0  . chlorpheniramine-HYDROcodone (TUSSIONEX PENNKINETIC ER) 10-8 MG/5ML SUER Take 5 mLs by mouth every 12 (twelve) hours as needed for cough. 140 mL 0  . fluticasone (FLONASE) 50 MCG/ACT nasal spray SPRAY 2 SPRAYS INTO EACH NOSTRIL EVERY DAY 48 mL 1  . glucose blood test strip Use with glucose meter to check blood sugar.ICD10: R73.01.Dispense based on insurance preference 100 each 12  . JUNEL FE 1/20 1-20 MG-MCG tablet Take 1 tablet by mouth daily.    . Lancets (FREESTYLE) lancets Use to check blood sugar ICD10: R73.01.Dispense based on insurance preference 100 each 12  . levocetirizine (XYZAL) 5 MG tablet Take 5 mg by mouth every evening.    Marland Kitchen losartan (COZAAR) 25 MG tablet Take 1 tablet (25 mg total) by mouth daily. 90 tablet 1  . metFORMIN (GLUCOPHAGE-XR) 500 MG 24 hr tablet TAKE 2 TABLETS BY MOUTH EVERY DAY WITH BREAKFAST 180 tablet 3  . triamterene-hydrochlorothiazide (DYAZIDE) 37.5-25 MG capsule Take 1 capsule by mouth daily.     Marland Kitchen guaiFENesin (MUCINEX) 600 MG 12 hr tablet Take 1 tablet (600 mg total) by mouth 2 (two) times daily as needed for cough or to loosen phlegm. (Patient not taking: Reported on 09/22/2020) 30 tablet 0  .  pravastatin (PRAVACHOL) 20 MG tablet Take 1 tablet (20 mg total) by mouth daily. (Patient not taking: Reported on 09/22/2020) 90 tablet 3   No current facility-administered medications on file prior to visit.   Allergies  Allergen Reactions  . Augmentin [Amoxicillin-Pot Clavulanate] Nausea And Vomiting    Vomiting   . Ciprofloxacin Hcl     Arm turned red  . Codeine Other (See Comments)    Makes pain worse  . Elemental Sulfur     rash  . Erythromycin     Rash    Social History    Socioeconomic History  . Marital status: Married    Spouse name: Not on file  . Number of children: Not on file  . Years of education: Not on file  . Highest education level: Not on file  Occupational History    Employer: [employer]  Tobacco Use  . Smoking status: Never Smoker  . Smokeless tobacco: Never Used  Vaping Use  . Vaping Use: Never used  Substance and Sexual Activity  . Alcohol use: No  . Drug use: No  . Sexual activity: Yes  Other Topics Concern  . 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   No caffeine   Social Determinants of Radio broadcast assistant Strain: Not on file  Food Insecurity: Not on file  Transportation Needs: Not on file  Physical Activity: Not on file  Stress: Not on file  Social Connections: Not on file  Intimate Partner Violence: Not on file      Review of Systems  Constitutional: Negative.   All other systems reviewed and are negative.      Objective:   Physical Exam Vitals reviewed.  Constitutional:      General: She is not in acute distress.    Appearance: Normal appearance. She is well-developed and normal weight. She is not ill-appearing, toxic-appearing or diaphoretic.  HENT:     Head: Normocephalic and atraumatic.     Right Ear: Tympanic membrane and ear canal normal.     Left Ear: Tympanic membrane and ear canal normal.     Nose: Nose normal. No congestion or rhinorrhea.     Mouth/Throat:     Mouth: Mucous membranes are moist.     Pharynx: Oropharynx is clear. No oropharyngeal exudate or posterior oropharyngeal erythema.  Eyes:     Extraocular Movements: Extraocular movements intact.     Conjunctiva/sclera: Conjunctivae normal.     Pupils: Pupils are equal, round, and reactive to light.  Neck:     Vascular: No carotid bruit.  Cardiovascular:     Rate and Rhythm: Normal rate and regular rhythm.     Heart sounds:  Normal heart sounds. No murmur heard. No friction rub. No gallop.   Pulmonary:     Effort: Pulmonary effort is normal. No respiratory distress.     Breath sounds: Normal breath sounds. No stridor. No wheezing, rhonchi or rales.  Chest:     Chest wall: No tenderness.  Abdominal:     General: Bowel sounds are normal. There is no distension.     Palpations: Abdomen is soft. There is no mass.     Tenderness: There is no abdominal tenderness. There is no guarding or rebound.     Hernia: No hernia is present.  Musculoskeletal:     Cervical back: Normal range of motion and neck supple. No  rigidity or tenderness.     Right lower leg: No edema.     Left lower leg: No edema.  Lymphadenopathy:     Cervical: No cervical adenopathy.  Skin:    General: Skin is warm.     Coloration: Skin is not jaundiced or pale.     Findings: No bruising, erythema, lesion or rash.  Neurological:     General: No focal deficit present.     Mental Status: She is alert and oriented to person, place, and time. Mental status is at baseline.     Cranial Nerves: No cranial nerve deficit.     Sensory: No sensory deficit.     Motor: No weakness.     Coordination: Coordination normal.     Gait: Gait normal.     Deep Tendon Reflexes: Reflexes normal.  Psychiatric:        Mood and Affect: Mood normal.        Behavior: Behavior normal.        Thought Content: Thought content normal.        Judgment: Judgment normal.           Assessment & Plan:  Controlled type 2 diabetes mellitus without complication, without long-term current use of insulin (HCC) - Plan: Hemoglobin A1c, COMPLETE METABOLIC PANEL WITH GFR, Lipid panel, Microalbumin, urine, CANCELED: CBC with Differential/Platelet  Other hyperlipidemia  General medical exam  Sinus tachycardia - Plan: TSH  Regarding her physical exam, her immunizations are up-to-date.  Colon cancer screening is due next year.  Pap smear mammogram which was performed.  Physical  exam today is normal aside from some mild tachycardia.  I believe she likely has a long COVID however I feel that she is medically stable to return to work.  That being said I will also check a TSH to rule out hyperthyroidism.  Check a CBC, CMP, fasting lipid panel, urine microalbumin, and an A1c.  Goal A1c is less than 6.5.  Goal LDL cholesterol is less than 100.  I recommended the patient temporarily hold losartan as perhaps dehydration and relatively low blood pressure could be contributing to the fatigue and tachycardia.

## 2020-09-23 LAB — COMPLETE METABOLIC PANEL WITH GFR
AG Ratio: 1.5 (calc) (ref 1.0–2.5)
ALT: 32 U/L — ABNORMAL HIGH (ref 6–29)
AST: 19 U/L (ref 10–35)
Albumin: 4.1 g/dL (ref 3.6–5.1)
Alkaline phosphatase (APISO): 77 U/L (ref 31–125)
BUN: 13 mg/dL (ref 7–25)
CO2: 25 mmol/L (ref 20–32)
Calcium: 9.3 mg/dL (ref 8.6–10.2)
Chloride: 104 mmol/L (ref 98–110)
Creat: 0.58 mg/dL (ref 0.50–1.10)
GFR, Est African American: 129 mL/min/{1.73_m2} (ref >=60)
GFR, Est Non African American: 111 mL/min/{1.73_m2} (ref >=60)
Globulin: 2.7 g/dL (calc) (ref 1.9–3.7)
Glucose, Bld: 154 mg/dL — ABNORMAL HIGH (ref 65–99)
Potassium: 3.8 mmol/L (ref 3.5–5.3)
Sodium: 140 mmol/L (ref 135–146)
Total Bilirubin: 0.6 mg/dL (ref 0.2–1.2)
Total Protein: 6.8 g/dL (ref 6.1–8.1)

## 2020-09-23 LAB — TSH: TSH: 1.46 mIU/L

## 2020-09-23 LAB — LIPID PANEL
Cholesterol: 182 mg/dL (ref <200)
HDL: 36 mg/dL — ABNORMAL LOW (ref >=50)
LDL Cholesterol (Calc): 113 mg/dL (calc) — ABNORMAL HIGH
Non-HDL Cholesterol (Calc): 146 mg/dL (calc) — ABNORMAL HIGH (ref <130)
Total CHOL/HDL Ratio: 5.1 (calc) — ABNORMAL HIGH (ref <5.0)
Triglycerides: 210 mg/dL — ABNORMAL HIGH (ref <150)

## 2020-09-23 LAB — MICROALBUMIN, URINE: Microalb, Ur: 1 mg/dL

## 2020-09-23 LAB — HEMOGLOBIN A1C
Hgb A1c MFr Bld: 6.3 % of total Hgb — ABNORMAL HIGH (ref <5.7)
Mean Plasma Glucose: 134 mg/dL
eAG (mmol/L): 7.4 mmol/L

## 2020-09-24 ENCOUNTER — Encounter: Payer: Self-pay | Admitting: Family Medicine

## 2020-09-24 ENCOUNTER — Other Ambulatory Visit: Payer: Self-pay | Admitting: *Deleted

## 2020-09-24 MED ORDER — PRAVASTATIN SODIUM 20 MG PO TABS: 20.0000 mg | ORAL_TABLET | Freq: Every day | ORAL | 3 refills | Status: DC

## 2020-10-12 ENCOUNTER — Encounter: Payer: Self-pay | Admitting: Internal Medicine

## 2020-10-12 ENCOUNTER — Ambulatory Visit: Payer: BC Managed Care – PPO | Admitting: Internal Medicine

## 2020-10-12 VITALS — BP 130/80 | HR 112 | Ht 64.5 in | Wt 185.0 lb

## 2020-10-12 DIAGNOSIS — U099 Post covid-19 condition, unspecified: Secondary | ICD-10-CM | POA: Diagnosis not present

## 2020-10-12 DIAGNOSIS — K580 Irritable bowel syndrome with diarrhea: Secondary | ICD-10-CM

## 2020-10-12 DIAGNOSIS — K645 Perianal venous thrombosis: Secondary | ICD-10-CM | POA: Diagnosis not present

## 2020-10-12 DIAGNOSIS — K648 Other hemorrhoids: Secondary | ICD-10-CM

## 2020-10-12 NOTE — Progress Notes (Signed)
Candice Jones 45 y.o. 1976/04/09 [id number]  Assessment & Plan:   Encounter Diagnoses  Name Primary?  . Irritable bowel syndrome with diarrhea Yes  . Long COVID   . Internal and external thrombosed hemorrhoids     No role for banding here that I can see I explained that it will not prevent thrombosis.  We will try to calm her diarrhea down which I suspect is post Covid IBS, with some IBgard.  Loperamide tends to constipate her and that would flare her hemorrhoids perhaps.  She was reassured hopefully with time her IBS symptoms will calm down.  I can see her back as needed.  Advised to try [facility] toilet paper forming spray to turn toilet paper into wet wipes   I appreciate the opportunity to care for this patient. CC: Candice Frizzle, MD    Subjective:   Chief Complaint: Hemorrhoids and diarrhea  HPI Candice Jones is a 45 year old white woman with a family history of colon cancer in her sister and a history of banding of internal hemorrhoids in 2015, status post Covid infection in January 2022.  She is complaining of diarrhea now after having Covid and had a thrombosed hemorrhoid in December prior to her Covid infection and had to have it excised at an urgent care.  That was a painful experience.  She was a bit constipated at that time.  She had a lot of diarrhea with the Covid was fairly sick a lot of aches and pains and dyspnea.  She has continued to have a lot of fatigue and has long Covid.  She is not having any symptoms from her hemorrhoids at this time she is just concerned and wondering if there is something I can do to prevent a thrombosis again. Allergies  Allergen Reactions  . Augmentin [Amoxicillin-Pot Clavulanate] Nausea And Vomiting    Vomiting   . Ciprofloxacin Hcl     Arm turned red  . Codeine Other (See Comments)    Makes pain worse  . Elemental Sulfur     rash  . Erythromycin     Rash    Current Meds  Medication Sig  . albuterol (VENTOLIN HFA) 108 (90 Base)  MCG/ACT inhaler Inhale 2 puffs into the lungs every 4 (four) hours as needed for wheezing or shortness of breath.  . Blood Glucose Monitoring Suppl KIT Use to check fasting glucose daily ICD10: R73.01.Dispense based on insurance preference  . fluticasone (FLONASE) 50 MCG/ACT nasal spray SPRAY 2 SPRAYS INTO EACH NOSTRIL EVERY DAY  . glucose blood test strip Use with glucose meter to check blood sugar.ICD10: R73.01.Dispense based on insurance preference  . JUNEL FE 1/20 1-20 MG-MCG tablet Take 1 tablet by mouth daily.  . Lancets (FREESTYLE) lancets Use to check blood sugar ICD10: R73.01.Dispense based on insurance preference  . levocetirizine (XYZAL) 5 MG tablet Take 5 mg by mouth every evening.  Marland Kitchen losartan (COZAAR) 25 MG tablet Take 1 tablet (25 mg total) by mouth daily.  . metFORMIN (GLUCOPHAGE-XR) 500 MG 24 hr tablet TAKE 2 TABLETS BY MOUTH EVERY DAY WITH BREAKFAST  . pravastatin (PRAVACHOL) 20 MG tablet Take 1 tablet (20 mg total) by mouth daily.  Marland Kitchen triamterene-hydrochlorothiazide (DYAZIDE) 37.5-25 MG capsule Take 1 capsule by mouth daily.    Past Medical History:  Diagnosis Date  . Adenomyosis   . Asthma   . COVID-19   . Diabetes ([place])   . Gall stones   . Gestational diabetes mellitus   . Hyperlipidemia   .  IBS (irritable bowel syndrome)   . Internal hemorrhoids   . Kidney stones   . Meniere's syndrome   . Vertigo    Past Surgical History:  Procedure Laterality Date  . CESAREAN SECTION  08/1999, 05/2003  . CHOLECYSTECTOMY  2011  . CYSTECTOMY  1997   left wrist   . ENDOMETRIAL ABLATION  01/2010  . HEMORRHOID BANDING  2015  . MINOR HEMORRHOIDECTOMY  04/2014   thrombosed  . 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   No caffeine alcohol tobacco or drugs   family history includes Cancer in her maternal grandfather; Colon cancer in her sister;  Diabetes in her father, maternal grandmother, and paternal grandmother; Diverticulitis in her sister; Heart disease in her maternal grandfather, maternal grandmother, and mother; High Cholesterol in her father, maternal grandfather, maternal grandmother, and mother; Hypertension in her maternal grandmother and mother; Kidney cancer in her maternal grandmother; Lupus in her sister; Prostate cancer in her paternal grandfather.   Review of Systems As per HPI  Objective:   Physical Exam BP 130/80   Pulse (!) 112   Ht 5' 4.5" (1.638 m)   Wt 185 lb (83.9 kg)   BMI 31.26 kg/m  Well-developed well-nourished obese to overweight white woman in no acute distress She is alert and oriented x3   Candice Jones, CMA present for exam  abd soft NT no HSM/mass  Rectal Anoderm inspection revealed slight erythema Digital exam revealed normal resting tone No mass or rectocele present.  Anoscopy  RA external pile/residual thrombosed hemorrhoid - Gr 1 int/ext hemorrhoids otherwise

## 2020-10-12 NOTE — Patient Instructions (Signed)
Today we are giving you samples of IBgard to use 2 capsules before meals as needed.   We are giving you information on Qleanse wipe clean foam.   I appreciate the opportunity to care for you. Stan Head, MD, [facility]

## 2020-10-22 ENCOUNTER — Encounter: Payer: Self-pay | Admitting: Family Medicine

## 2020-10-23 ENCOUNTER — Encounter: Payer: Self-pay | Admitting: Family Medicine

## 2020-10-23 LAB — HM DIABETES EYE EXAM

## 2020-10-29 ENCOUNTER — Encounter: Payer: Self-pay | Admitting: Family Medicine

## 2020-11-11 ENCOUNTER — Other Ambulatory Visit: Payer: Self-pay

## 2020-11-11 MED ORDER — LOSARTAN POTASSIUM 25 MG PO TABS: 25.0000 mg | ORAL_TABLET | Freq: Every day | ORAL | 1 refills | Status: DC

## 2020-11-17 ENCOUNTER — Encounter: Payer: Self-pay | Admitting: Emergency Medicine

## 2020-11-17 ENCOUNTER — Other Ambulatory Visit: Payer: Self-pay

## 2020-11-17 ENCOUNTER — Ambulatory Visit
Admission: EM | Admit: 2020-11-17 | Discharge: 2020-11-17 | Disposition: A | Discharge status: Home / Self Care | Payer: BC Managed Care – PPO | Attending: Emergency Medicine | Admitting: Emergency Medicine

## 2020-11-17 DIAGNOSIS — M5440 Lumbago with sciatica, unspecified side: Secondary | ICD-10-CM | POA: Diagnosis not present

## 2020-11-17 MED ORDER — PREDNISONE 10 MG (21) PO TBPK: ORAL_TABLET | Freq: Every day | ORAL | 0 refills | Status: DC

## 2020-11-17 NOTE — Discharge Instructions (Addendum)
Use heat as needed Take nsaids as needed  May need to see ortho after 1 week for further eval  Rest area

## 2020-11-17 NOTE — ED Triage Notes (Signed)
Low back mostly on the LT side since around 430pm today after picking up a box.

## 2020-11-25 ENCOUNTER — Encounter: Payer: Self-pay | Admitting: Family Medicine

## 2020-11-26 ENCOUNTER — Encounter: Payer: Self-pay | Admitting: Nurse Practitioner

## 2020-11-26 ENCOUNTER — Ambulatory Visit: Payer: BC Managed Care – PPO | Admitting: Nurse Practitioner

## 2020-11-26 ENCOUNTER — Ambulatory Visit
Admission: RE | Admit: 2020-11-26 | Discharge: 2020-11-26 | Disposition: A | Discharge status: Home / Self Care | Payer: BC Managed Care – PPO | Source: Ambulatory Visit | Attending: Nurse Practitioner | Admitting: Nurse Practitioner

## 2020-11-26 ENCOUNTER — Other Ambulatory Visit: Payer: Self-pay

## 2020-11-26 VITALS — BP 122/76 | HR 84 | Temp 98.7°F | Ht 64.5 in | Wt 178.0 lb

## 2020-11-26 DIAGNOSIS — M5442 Lumbago with sciatica, left side: Secondary | ICD-10-CM

## 2020-11-26 NOTE — Progress Notes (Signed)
Subjective:    Patient ID: Candice Jones, female    DOB: [birth year], 45 y.o.   MRN: [id number]  HPI: Candice Jones is a 45 y.o. female presenting for back pain follow up.  Chief Complaint  Patient presents with  . Back Pain    Muscular pain in the back, bent the wrong way putting down a box last Tuesday, no imaging. Went to urgent care. Given prednisone and taking tylenol and motrin   BACK PAIN Patient reports back pain started when she was reaching for something in a sitting position on the floor.  She went to urgent care where she was given to prednisone courses.  She is still having back pain however it is improved since it for started. Duration: ~9 days  Mechanism of injury: bending over Location: low mid; left side Onset: sudden Severity: "annoying" Quality: burning/hot Frequency: comes and goes Radiation: L leg below the knee Aggravating factors: lifting, pushing on it, random times,  Alleviating factors: sleeping, laying down,  Status: better Treatments attempted:  Prednisone, Tylenol, Motrin, heat Relief with NSAIDs?: no Nighttime pain:  no Paresthesias / decreased sensation:  yes; to left second toe Bowel / bladder incontinence:  no Fevers:  no Dysuria / urinary frequency:  no  Allergies  Allergen Reactions  . Augmentin [Amoxicillin-Pot Clavulanate] Nausea And Vomiting    Vomiting   . Ciprofloxacin Hcl     Arm turned red  . Codeine Other (See Comments)    Makes pain worse  . Elemental Sulfur     rash  . Erythromycin     Rash     Outpatient Encounter Medications as of 11/26/2020  Medication Sig  . albuterol (VENTOLIN HFA) 108 (90 Base) MCG/ACT inhaler Inhale 2 puffs into the lungs every 4 (four) hours as needed for wheezing or shortness of breath.  . Blood Glucose Monitoring Suppl KIT Use to check fasting glucose daily ICD10: R73.01.Dispense based on insurance preference  . fluticasone (FLONASE) 50 MCG/ACT nasal spray SPRAY 2 SPRAYS INTO EACH NOSTRIL EVERY  DAY  . glucose blood test strip Use with glucose meter to check blood sugar.ICD10: R73.01.Dispense based on insurance preference  . JUNEL FE 1/20 1-20 MG-MCG tablet Take 1 tablet by mouth daily.  . Lancets (FREESTYLE) lancets Use to check blood sugar ICD10: R73.01.Dispense based on insurance preference  . levocetirizine (XYZAL) 5 MG tablet Take 5 mg by mouth every evening.  Marland Kitchen losartan (COZAAR) 25 MG tablet Take 1 tablet (25 mg total) by mouth daily.  . metFORMIN (GLUCOPHAGE-XR) 500 MG 24 hr tablet TAKE 2 TABLETS BY MOUTH EVERY DAY WITH BREAKFAST  . pravastatin (PRAVACHOL) 20 MG tablet Take 1 tablet (20 mg total) by mouth daily.  . predniSONE (STERAPRED UNI-PAK 21 TAB) 10 MG (21) TBPK tablet Take by mouth daily. Take 6 tabs by mouth daily  for 2 days, then 5 tabs for 2 days, then 4 tabs for 2 days, then 3 tabs for 2 days, 2 tabs for 2 days, then 1 tab by mouth daily for 2 days  . triamterene-hydrochlorothiazide (DYAZIDE) 37.5-25 MG capsule Take 1 capsule by mouth daily.    No facility-administered encounter medications on file as of 11/26/2020.    Patient Active Problem List   Diagnosis Date Noted  . Family history of colon cancer - sister < 60 02/02/2017  . Internal hemorrhoids with bleeding and prolapse 06/24/2014  . IBS (irritable bowel syndrome) 06/24/2014  . Endometriosis 03/03/2013  . Elevated fasting glucose 03/01/2013  .  Gallstones 05/12/2011    Past Medical History:  Diagnosis Date  . Adenomyosis   . Asthma   . COVID-19   . Diabetes ([place])   . Gall stones   . Gestational diabetes mellitus   . Hyperlipidemia   . IBS (irritable bowel syndrome)   . Internal hemorrhoids   . Kidney stones   . Meniere's syndrome   . Vertigo     Relevant past medical, surgical, family and social history reviewed and updated as indicated. Interim medical history since our last visit reviewed.  Review of Systems Per HPI unless specifically indicated above     Objective:    BP 122/76    Pulse 84   Temp 98.7 F (37.1 C)   Ht 5' 4.5" (1.638 m)   Wt 178 lb (80.7 kg)   SpO2 100%   BMI 30.08 kg/m   Wt Readings from Last 3 Encounters:  11/26/20 178 lb (80.7 kg)  10/12/20 185 lb (83.9 kg)  09/22/20 188 lb (85.3 kg)    Physical Exam Vitals and nursing note reviewed.  Constitutional:      General: She is not in acute distress.    Appearance: Normal appearance. She is not toxic-appearing.  Musculoskeletal:        General: Normal range of motion.     Lumbar back: Tenderness present. No swelling, edema or spasms. Normal range of motion.       Back:     Comments: Lumbar back tender to palpation in the areas marked above  Skin:    General: Skin is warm and dry.     Capillary Refill: Capillary refill takes less than 2 seconds.     Coloration: Skin is not jaundiced or pale.     Findings: No erythema.  Neurological:     Mental Status: She is alert and oriented to person, place, and time.     Motor: No weakness.     Gait: Gait normal.  Psychiatric:        Mood and Affect: Mood normal.        Behavior: Behavior normal.        Thought Content: Thought content normal.        Judgment: Judgment normal.       Assessment & Plan:  1. Acute midline low back pain with left-sided sciatica Acute.  No red flags in history or on examination today.  Discussed that many people with back pain symptoms will completely resolve in less than 6 weeks.  Since back pain is improving, will plan to continue prednisone until completion, then okay to start alternating Tylenol and ibuprofen as needed for pain.  Back stretches/exercises given.  Discussed good body mechanics and posture.  Given pain shooting down leg to left second toe, will obtain x-ray imaging.  If pain persists, will consider physical therapy referral.  If pain persist more than 6 weeks, consider further imaging based on patient's presentation and symptoms.  - DG Lumbar Spine Complete; Future    Follow up plan: Return if  symptoms worsen or fail to improve.

## 2020-11-26 NOTE — Patient Instructions (Signed)

## 2020-11-30 ENCOUNTER — Encounter: Payer: Self-pay | Admitting: Family Medicine

## 2020-12-21 ENCOUNTER — Encounter: Payer: Self-pay | Admitting: Family Medicine

## 2020-12-22 MED ORDER — NIRMATRELVIR/RITONAVIR (PAXLOVID)TABLET: 3.0000 | ORAL_TABLET | Freq: Two times a day (BID) | ORAL | 0 refills | Status: AC

## 2021-01-09 ENCOUNTER — Encounter: Payer: Self-pay | Admitting: Family Medicine

## 2021-01-11 ENCOUNTER — Other Ambulatory Visit: Payer: Self-pay | Admitting: *Deleted

## 2021-01-11 DIAGNOSIS — Z111 Encounter for screening for respiratory tuberculosis: Secondary | ICD-10-CM

## 2021-01-11 DIAGNOSIS — Z021 Encounter for pre-employment examination: Secondary | ICD-10-CM

## 2021-01-14 ENCOUNTER — Telehealth: Payer: Self-pay | Admitting: Family Medicine

## 2021-01-14 NOTE — Telephone Encounter (Signed)
Awaiting forms

## 2021-01-14 NOTE — Telephone Encounter (Signed)
Pt came in to get some info put on her health exam form for [employer]. I highlighted the info needed on her intake form. Pt would like to be able to pick up form by 6/20 if possible. Please call when available for pick up.Form place in nurse's folder.  Cb#: [phone]

## 2021-01-14 NOTE — Telephone Encounter (Signed)
Completed and routed to provider.

## 2021-04-13 ENCOUNTER — Other Ambulatory Visit: Payer: Self-pay | Admitting: Family Medicine

## 2021-05-02 ENCOUNTER — Other Ambulatory Visit: Payer: Self-pay

## 2021-05-02 ENCOUNTER — Encounter: Payer: Self-pay | Admitting: Emergency Medicine

## 2021-05-02 ENCOUNTER — Ambulatory Visit
Admission: EM | Admit: 2021-05-02 | Discharge: 2021-05-02 | Disposition: A | Discharge status: Home / Self Care | Payer: BC Managed Care – PPO | Attending: Internal Medicine | Admitting: Internal Medicine

## 2021-05-02 DIAGNOSIS — J029 Acute pharyngitis, unspecified: Secondary | ICD-10-CM

## 2021-05-02 LAB — POCT RAPID STREP A (OFFICE): Rapid Strep A Screen: NEGATIVE

## 2021-05-02 MED ORDER — MOUTHWASH COMPOUNDING BASE PO LIQD: 15.0000 mL | Freq: Four times a day (QID) | ORAL | 0 refills | Status: DC | PRN

## 2021-05-02 NOTE — Discharge Instructions (Addendum)
Warm salt water gargle Use medications as prescribed Tylenol/Motrin as needed for pain Return to urgent care if symptoms worsen.

## 2021-05-02 NOTE — ED Provider Notes (Signed)
[facility]    CSN: [id number] Arrival date & time: 05/02/21  1120      History   Chief Complaint Chief Complaint  Patient presents with   URI    HPI Candice Jones is a 45 y.o. female comes to the urgent care with 3-day history of generalized fatigue, nasal congestion and a sore throat.  Symptom onset was fairly sudden and has been persistent.  This morning patient developed a headache as well as worsening sore throat.  She has had a couple of emesis.  She denies any diarrhea.  She has had some febrile episodes.  No dizziness, near syncope or syncopal episodes.Marland Kitchen   HPI  Past Medical History:  Diagnosis Date   Adenomyosis    Asthma    COVID-19    Diabetes ([place])    Gall stones    Gestational diabetes mellitus    Hyperlipidemia    IBS (irritable bowel syndrome)    Internal hemorrhoids    Kidney stones    Meniere's syndrome    Vertigo     Patient Active Problem List   Diagnosis Date Noted   Family history of colon cancer - sister < 60 02/02/2017   Internal hemorrhoids with bleeding and prolapse 06/24/2014   IBS (irritable bowel syndrome) 06/24/2014   Endometriosis 03/03/2013   Elevated fasting glucose 03/01/2013   Gallstones 05/12/2011    Past Surgical History:  Procedure Laterality Date   CESAREAN SECTION  08/1999, 05/2003   CHOLECYSTECTOMY  2011   CYSTECTOMY  1997   left wrist    ENDOMETRIAL ABLATION  01/2010   HEMORRHOID BANDING  2015   MINOR HEMORRHOIDECTOMY  04/2014   thrombosed   WISDOM TOOTH EXTRACTION  1996/1997    OB History   No obstetric history on file.      Home Medications    Prior to Admission medications   Medication Sig Start Date End Date Taking? Authorizing Provider  Mouthwash Compounding Base LIQD Swish and spit 15 mLs every 6 (six) hours as needed. Compounding formula: Maalox 20m, Lidocaine viscous 2% 6397m Benadryl 12.97m33mml 60 ml 05/02/21  Yes Lucius Wise, PhiMyrene GalasD  albuterol (VENTOLIN HFA) 108 (90 Base) MCG/ACT inhaler  Inhale 2 puffs into the lungs every 4 (four) hours as needed for wheezing or shortness of breath. 02/11/20   BatAnnie MainNP  Blood Glucose Monitoring Suppl KIT Use to check fasting glucose daily ICD10: R73.01.Dispense based on insurance preference 07/30/19   PicSusy FrizzleD  fluticasone ([facility] MCG/ACT nasal spray SPRAY 2 SPRAYS INTO EACH NOSTRIL EVERY DAY 03/19/20   BatIshmael Holter FNP  glucose blood test strip Use with glucose meter to check blood sugar.ICD10: R73.01.Dispense based on insurance preference 02/09/18   PicSusy FrizzleD  JUNEL FE 1/20 1-20 MG-MCG tablet Take 1 tablet by mouth daily. 07/29/19   [provider]  Lancets (FREESTYLE) lancets Use to check blood sugar ICD10: R73.01.Dispense based on insurance preference 02/09/18   PicSusy FrizzleD  levocetirizine (XYZAL) 5 MG tablet Take 5 mg by mouth every evening.    [provider]  losartan (COZAAR) 25 MG tablet TAKE 1 TABLET (25 MG TOTAL) BY MOUTH DAILY. 04/13/21   PicSusy FrizzleD  metFORMIN (GLUCOPHAGE-XR) 500 MG 24 hr tablet TAKE 2 TABLETS BY MOUTH EVERY DAY WITH BREAKFAST 09/15/20   PicSusy FrizzleD  pravastatin (PRAVACHOL) 20 MG tablet Take 1 tablet (20 mg total) by mouth daily. 09/24/20   Pickard,  Cammie Mcgee, MD  predniSONE (STERAPRED UNI-PAK 21 TAB) 10 MG (21) TBPK tablet Take by mouth daily. Take 6 tabs by mouth daily  for 2 days, then 5 tabs for 2 days, then 4 tabs for 2 days, then 3 tabs for 2 days, 2 tabs for 2 days, then 1 tab by mouth daily for 2 days 11/17/20   Marney Setting, NP  triamterene-hydrochlorothiazide (DYAZIDE) 37.5-25 MG capsule Take 1 capsule by mouth daily.  04/10/14   [provider]    Family History Family History  Problem Relation Age of Onset   High Cholesterol Mother    Hypertension Mother    Heart disease Mother    Diabetes Father    High Cholesterol Father    Colon cancer Sister        negative for Lynch syndrome   Diverticulitis  Sister    Diabetes Maternal Grandmother    Heart disease Maternal Grandmother    Hypertension Maternal Grandmother    High Cholesterol Maternal Grandmother    Kidney cancer Maternal Grandmother    Heart disease Maternal Grandfather    High Cholesterol Maternal Grandfather    Cancer Maternal Grandfather    Diabetes Paternal Grandmother    Prostate cancer Paternal Grandfather    Lupus Sister     Social History Social History   Tobacco Use   Smoking status: Never   Smokeless tobacco: Never  Vaping Use   Vaping Use: Never used  Substance Use Topics   Alcohol use: No   Drug use: No     Allergies   Augmentin [amoxicillin-pot clavulanate], Ciprofloxacin hcl, Codeine, Elemental sulfur, and Erythromycin   Review of Systems Review of Systems  Constitutional:  Positive for fatigue and fever. Negative for chills.  HENT:  Positive for congestion and sore throat.   Respiratory:  Positive for cough. Negative for shortness of breath and wheezing.   Gastrointestinal: Negative.   Genitourinary: Negative.   Neurological: Negative.     Physical Exam Triage Vital Signs ED Triage Vitals  Enc Vitals Group     BP 05/02/21 1245 (!) 143/83     Pulse Rate 05/02/21 1245 (!) 110     Resp 05/02/21 1245 18     Temp 05/02/21 1245 98.3 F (36.8 C)     Temp Source 05/02/21 1245 Oral     SpO2 05/02/21 1245 96 %     Weight 05/02/21 1255 185 lb (83.9 kg)     Height --      Head Circumference --      Peak Flow --      Pain Score 05/02/21 1255 7     Pain Loc --      Pain Edu? --      Excl. in [place]? --    No data found.  Updated Vital Signs BP (!) 143/83 (BP Location: Right Arm)   Pulse (!) 110   Temp 98.3 F (36.8 C) (Oral)   Resp 18   Wt 83.9 kg   SpO2 96%   BMI 31.26 kg/m   Visual Acuity Right Eye Distance:   Left Eye Distance:   Bilateral Distance:    Right Eye Near:   Left Eye Near:    Bilateral Near:     Physical Exam Vitals and nursing note reviewed.   Constitutional:      General: She is not in acute distress.    Appearance: She is not ill-appearing.  HENT:     Right Ear: Tympanic membrane normal.  Left Ear: Tympanic membrane normal.     Mouth/Throat:     Mouth: Mucous membranes are moist.     Pharynx: Posterior oropharyngeal erythema present.  Cardiovascular:     Rate and Rhythm: Normal rate and regular rhythm.     Pulses: Normal pulses.     Heart sounds: Normal heart sounds.  Pulmonary:     Effort: Pulmonary effort is normal.     Breath sounds: Normal breath sounds.  Neurological:     Mental Status: She is alert.     UC Treatments / Results  Labs (all labs ordered are listed, but only abnormal results are displayed) Labs Reviewed  COVID-19, FLU A+B NAA  POCT RAPID STREP A (OFFICE)    EKG   Radiology No results found.  Procedures Procedures (including critical care time)  Medications Ordered in UC Medications - No data to display  Initial Impression / Assessment and Plan / UC Course  I have reviewed the triage vital signs and the nursing notes.  Pertinent labs & imaging results that were available during my care of the patient were reviewed by me and considered in my medical decision making (see chart for details).     1.  Acute viral pharyngitis: Warm salt water gargle Continue ibuprofen/Tylenol as needed for pain Maintain adequate hydration Use medications as prescribed Point-of-care strep test is negative COVID-19 and flu test has been sent. Return to urgent care if you have any further concerns. Final Clinical Impressions(s) / UC Diagnoses   Final diagnoses:  Sore throat  Acute viral pharyngitis     Discharge Instructions      Warm salt water gargle Use medications as prescribed Tylenol/Motrin as needed for pain Return to urgent care if symptoms worsen.   ED Prescriptions     Medication Sig Dispense Auth. Provider   Mouthwash Compounding Base LIQD Swish and spit 15 mLs every 6  (six) hours as needed. Compounding formula: Maalox 71m, Lidocaine viscous 2% 656m Benadryl 12.59m71mml 60 ml 180 mL Murlene Revell, PhiMyrene GalasD      PDMP not reviewed this encounter.   LamChase PicketD 05/02/21 1359

## 2021-05-02 NOTE — ED Triage Notes (Signed)
Since Thursday has been fatigued and sore throat. Has a head ache that started today.  Congestion, and ear pressure.  Fevers.

## 2021-05-03 LAB — COVID-19, FLU A+B NAA
Influenza A, NAA: NOT DETECTED
Influenza B, NAA: NOT DETECTED
SARS-CoV-2, NAA: NOT DETECTED

## 2021-06-01 ENCOUNTER — Other Ambulatory Visit: Payer: Self-pay

## 2021-06-01 ENCOUNTER — Ambulatory Visit
Admission: RE | Admit: 2021-06-01 | Discharge: 2021-06-01 | Disposition: A | Discharge status: Home / Self Care | Payer: BC Managed Care – PPO | Source: Ambulatory Visit | Attending: Family Medicine | Admitting: Family Medicine

## 2021-06-01 VITALS — BP 113/68 | HR 102 | Temp 98.4°F | Resp 20

## 2021-06-01 DIAGNOSIS — K644 Residual hemorrhoidal skin tags: Secondary | ICD-10-CM | POA: Diagnosis not present

## 2021-06-01 NOTE — ED Triage Notes (Signed)
Patient presents to Urgent Care for a Hemorid. Has used preparation h with lidocaine at 3pm.

## 2021-06-02 NOTE — ED Provider Notes (Addendum)
[facility]   [id number] 06/01/21 Arrival Time: 1742  ASSESSMENT & PLAN:  1. Inflamed external hemorrhoid    No thrombosed hemorrhoids. Cont Prep H with lidocaine. Plans f/u with GI. Work note given.  Reviewed expectations re: course of current medical issues. Questions answered. Outlined signs and symptoms indicating need for more acute intervention. Patient verbalized understanding. After Visit Summary given.   SUBJECTIVE: History from: patient. Candice Jones is a 45 y.o. female who presents with complaint of hemorrhoid; past 2 weeks; off/on pain. Prep H with lido helping. No bleeding. Very painful at times. H/O hemorrhoids req banding.  No LMP recorded. (Menstrual status: Oral contraceptives).  Past Surgical History:  Procedure Laterality Date   CESAREAN SECTION  08/1999, 05/2003   CHOLECYSTECTOMY  2011   CYSTECTOMY  1997   left wrist    ENDOMETRIAL ABLATION  01/2010   HEMORRHOID BANDING  2015   MINOR HEMORRHOIDECTOMY  04/2014   thrombosed   WISDOM TOOTH EXTRACTION  1996/1997     OBJECTIVE:  Vitals:   06/01/21 1805  BP: 113/68  Pulse: (!) 102  Resp: 20  Temp: 98.4 F (36.9 C)  TempSrc: Oral  SpO2: 96%    General appearance: alert, oriented, no acute distress Abdomen: soft Rectal: (RN chaperone present) hemorrhoid present; very inflamed; no bleeding; able to reduce Skin: warm and dry Psychological: alert and cooperative; normal mood and affect   Allergies  Allergen Reactions   Augmentin [Amoxicillin-Pot Clavulanate] Nausea And Vomiting    Vomiting    Ciprofloxacin Hcl     Arm turned red   Codeine Other (See Comments)    Makes pain worse   Elemental Sulfur     rash   Erythromycin     Rash                                                Past Medical History:  Diagnosis Date   Adenomyosis    Asthma    COVID-19    Diabetes ([facility])    Gall stones    Gestational diabetes mellitus    Hyperlipidemia    IBS (irritable bowel syndrome)     Internal hemorrhoids    Kidney stones    Meniere's syndrome    Vertigo     Social History   Socioeconomic History   Marital status: Married    Spouse name: Not on file   Number of children: 3   Years of education: Not on file   Highest education level: Not on file  Occupational History   Occupation: Energy manager: [employer]  Tobacco Use   Smoking status: Never   Smokeless tobacco: Never  Vaping Use   Vaping Use: Never used  Substance and Sexual Activity   Alcohol use: No   Drug use: No   Sexual activity: Yes  Other Topics Concern   Not on file  Social History Narrative   Married, 2 sons and 1 daughter   Fifth grade teacher [employer]   No caffeine alcohol tobacco or drugs   Social Determinants of Corporate investment banker Strain: Not on file  Food Insecurity: Not on file  Transportation Needs: Not on file  Physical Activity: Not on file  Stress: Not on file  Social Connections: Not on file  Intimate Partner Violence:  Not on file    Family History  Problem Relation Age of Onset   High Cholesterol Mother    Hypertension Mother    Heart disease Mother    Diabetes Father    High Cholesterol Father    Colon cancer Sister        negative for Lynch syndrome   Diverticulitis Sister    Diabetes Maternal Grandmother    Heart disease Maternal Grandmother    Hypertension Maternal Grandmother    High Cholesterol Maternal Grandmother    Kidney cancer Maternal Grandmother    Heart disease Maternal Grandfather    High Cholesterol Maternal Grandfather    Cancer Maternal Grandfather    Diabetes Paternal Grandmother    Prostate cancer Paternal Grandfather    Lupus Sister      Mardella Layman, MD 06/02/21 4287    Mardella Layman, MD 06/02/21 [phone]

## 2021-06-05 ENCOUNTER — Other Ambulatory Visit: Payer: Self-pay

## 2021-06-05 ENCOUNTER — Encounter (HOSPITAL_COMMUNITY): Payer: Self-pay | Admitting: Oncology

## 2021-06-05 ENCOUNTER — Emergency Department (HOSPITAL_COMMUNITY)
Admission: EM | Admit: 2021-06-05 | Discharge: 2021-06-05 | Disposition: A | Discharge status: Home / Self Care | Payer: BC Managed Care – PPO | Attending: Emergency Medicine | Admitting: Emergency Medicine

## 2021-06-05 DIAGNOSIS — Z8616 Personal history of COVID-19: Secondary | ICD-10-CM | POA: Insufficient documentation

## 2021-06-05 DIAGNOSIS — J45909 Unspecified asthma, uncomplicated: Secondary | ICD-10-CM | POA: Insufficient documentation

## 2021-06-05 DIAGNOSIS — E119 Type 2 diabetes mellitus without complications: Secondary | ICD-10-CM | POA: Insufficient documentation

## 2021-06-05 DIAGNOSIS — Z7984 Long term (current) use of oral hypoglycemic drugs: Secondary | ICD-10-CM | POA: Insufficient documentation

## 2021-06-05 DIAGNOSIS — K645 Perianal venous thrombosis: Secondary | ICD-10-CM | POA: Diagnosis not present

## 2021-06-05 DIAGNOSIS — K6289 Other specified diseases of anus and rectum: Secondary | ICD-10-CM | POA: Diagnosis present

## 2021-06-05 LAB — BASIC METABOLIC PANEL
Anion gap: 11 (ref 5–15)
BUN: 11 mg/dL (ref 6–20)
CO2: 26 mmol/L (ref 22–32)
Calcium: 9.1 mg/dL (ref 8.9–10.3)
Chloride: 100 mmol/L (ref 98–111)
Creatinine, Ser: 0.54 mg/dL (ref 0.44–1.00)
GFR, Estimated: >60 mL/min (ref >60)
Glucose, Bld: 134 mg/dL — ABNORMAL HIGH (ref 70–99)
Potassium: 3.2 mmol/L — ABNORMAL LOW (ref 3.5–5.1)
Sodium: 137 mmol/L (ref 135–145)

## 2021-06-05 LAB — CBC WITH DIFFERENTIAL/PLATELET
Abs Immature Granulocytes: 0.04 10*3/uL (ref 0.00–0.07)
Basophils Absolute: 0.1 10*3/uL (ref 0.0–0.1)
Basophils Relative: 1 %
Eosinophils Absolute: 0.1 10*3/uL (ref 0.0–0.5)
Eosinophils Relative: 1 %
HCT: 47.8 % — ABNORMAL HIGH (ref 36.0–46.0)
Hemoglobin: 16.3 g/dL — ABNORMAL HIGH (ref 12.0–15.0)
Immature Granulocytes: 0 %
Lymphocytes Relative: 36 %
Lymphs Abs: 3.7 10*3/uL (ref 0.7–4.0)
MCH: 30.2 pg (ref 26.0–34.0)
MCHC: 34.1 g/dL (ref 30.0–36.0)
MCV: 88.5 fL (ref 80.0–100.0)
Monocytes Absolute: 0.6 10*3/uL (ref 0.1–1.0)
Monocytes Relative: 6 %
Neutro Abs: 6 10*3/uL (ref 1.7–7.7)
Neutrophils Relative %: 56 %
Platelets: 327 10*3/uL (ref 150–400)
RBC: 5.4 MIL/uL — ABNORMAL HIGH (ref 3.87–5.11)
RDW: 13.3 % (ref 11.5–15.5)
WBC: 10.5 10*3/uL (ref 4.0–10.5)
nRBC: 0 % (ref 0.0–0.2)

## 2021-06-05 LAB — TYPE AND SCREEN
ABO/RH(D): A NEG
Antibody Screen: NEGATIVE

## 2021-06-05 LAB — ABO/RH: ABO/RH(D): A NEG

## 2021-06-05 MED ORDER — SODIUM CHLORIDE 0.9 % IV BOLUS
500.0000 mL | Freq: Once | INTRAVENOUS | Status: AC
  Administered 2021-06-05: 500 mL via INTRAVENOUS

## 2021-06-05 MED ORDER — OXYCODONE HCL 5 MG PO TABS: 5.0000 mg | ORAL_TABLET | Freq: Four times a day (QID) | ORAL | 0 refills | Status: DC | PRN

## 2021-06-05 MED ORDER — HYDROMORPHONE HCL 1 MG/ML IJ SOLN
0.5000 mg | Freq: Once | INTRAMUSCULAR | Status: AC
  Administered 2021-06-05: 0.5 mg via INTRAVENOUS
  Filled 2021-06-05: qty 1

## 2021-06-05 MED ORDER — MORPHINE SULFATE (PF) 4 MG/ML IV SOLN
4.0000 mg | Freq: Once | INTRAVENOUS | Status: AC
  Administered 2021-06-05: 4 mg via INTRAVENOUS
  Filled 2021-06-05: qty 1

## 2021-06-05 NOTE — ED Provider Notes (Signed)
[facility] DEPT Provider Note   CSN: [id number] Arrival date & time: 06/05/21  1644     History Chief Complaint  Patient presents with   Rectal Bleeding    Candice Jones is a 45 y.o. female.  45 year old female with prior medical history as detailed below presents for evaluation.  Patient with longstanding history of hemorrhoids.  Patient reports rectal and hemorrhoidal pain is an ongoing significant issue for at least the last 3 weeks.  She was seen on Tuesday of this past week by urgent care for same complaint.  She was noted to have an inflamed external hemorrhoid at that time on exam.  Patient reports increased pain shortly after this urgent care evaluation.  She reports some bleeding with bowel movements persistently since her urgent care evaluation as well.  The history is provided by the patient and medical records.  Rectal Bleeding Quality:  Bright red Amount:  Scant Duration:  5 days Timing:  Constant Chronicity:  New Context: hemorrhoids and rectal pain   Pain details:    Quality:  Aching   Severity:  Moderate   Duration:  5 days   Timing:  Constant   Progression:  Waxing and waning Similar prior episodes: yes       Past Medical History:  Diagnosis Date   Adenomyosis    Asthma    COVID-19    Diabetes ([place])    Gall stones    Gestational diabetes mellitus    Hyperlipidemia    IBS (irritable bowel syndrome)    Internal hemorrhoids    Kidney stones    Meniere's syndrome    Vertigo     Patient Active Problem List   Diagnosis Date Noted   Family history of colon cancer - sister < 60 02/02/2017   Internal hemorrhoids with bleeding and prolapse 06/24/2014   IBS (irritable bowel syndrome) 06/24/2014   Endometriosis 03/03/2013   Elevated fasting glucose 03/01/2013   Gallstones 05/12/2011    Past Surgical History:  Procedure Laterality Date   CESAREAN SECTION  08/1999, 05/2003   CHOLECYSTECTOMY  2011   CYSTECTOMY  1997    left wrist    ENDOMETRIAL ABLATION  01/2010   HEMORRHOID BANDING  2015   MINOR HEMORRHOIDECTOMY  04/2014   thrombosed   WISDOM TOOTH EXTRACTION  1996/1997     OB History   No obstetric history on file.     Family History  Problem Relation Age of Onset   High Cholesterol Mother    Hypertension Mother    Heart disease Mother    Diabetes Father    High Cholesterol Father    Colon cancer Sister        negative for Lynch syndrome   Diverticulitis Sister    Diabetes Maternal Grandmother    Heart disease Maternal Grandmother    Hypertension Maternal Grandmother    High Cholesterol Maternal Grandmother    Kidney cancer Maternal Grandmother    Heart disease Maternal Grandfather    High Cholesterol Maternal Grandfather    Cancer Maternal Grandfather    Diabetes Paternal Grandmother    Prostate cancer Paternal Grandfather    Lupus Sister     Social History   Tobacco Use   Smoking status: Never   Smokeless tobacco: Never  Vaping Use   Vaping Use: Never used  Substance Use Topics   Alcohol use: No   Drug use: No    Home Medications Prior to Admission medications   Medication Sig Start  Date End Date Taking? Authorizing Provider  acetaminophen (TYLENOL) 500 MG tablet Take 1,000 mg by mouth every 6 (six) hours as needed (pain).   Yes [provider]  albuterol (VENTOLIN HFA) 108 (90 Base) MCG/ACT inhaler Inhale 2 puffs into the lungs every 4 (four) hours as needed for wheezing or shortness of breath. 02/11/20  Yes Bates, Crystal A, FNP  Azelastine HCl 137 MCG/SPRAY SOLN Place 2 sprays into both nostrils every morning. 04/13/21  Yes [provider]  Cranberry-Vitamin C-Vitamin E (CRANBERRY PLUS VITAMIN C) 4200-20-3 MG-MG-UNIT CAPS Take 2 capsules by mouth every morning.   Yes [provider]  docusate sodium (DULCOLAX) 100 MG capsule Take 100 mg by mouth every morning.   Yes [provider]  fluticasone (FLONASE) 50 MCG/ACT nasal spray SPRAY 2  SPRAYS INTO EACH NOSTRIL EVERY DAY Patient taking differently: Place 2 sprays into both nostrils daily as needed for allergies or rhinitis. 03/19/20  Yes Bates, Crystal A, FNP  Lido-PE-Glycerin-Petrolatum (PREPARATION H RAPID RELIEF) 5-0.25-14.4-15 % CREA Place 1 application rectally as needed (after each bowel movement).   Yes [provider]  Multiple Vitamins-Minerals (IMMUNE SUPPORT) CHEW Chew 2 tablets by mouth every morning.   Yes [provider]  PEPPERMINT OIL PO Take 1 capsule by mouth every morning.   Yes [provider]  witch hazel-glycerin (TUCKS) pad Apply 1 application topically as needed for hemorrhoids (after each visit to restroom).   Yes [provider]  Blood Glucose Monitoring Suppl KIT Use to check fasting glucose daily ICD10: R73.01.Dispense based on insurance preference 07/30/19   Jenna Luo T, MD  glucose blood test strip Use with glucose meter to check blood sugar.ICD10: R73.01.Dispense based on insurance preference 02/09/18   Susy Frizzle, MD  JUNEL FE 1/20 1-20 MG-MCG tablet Take 1 tablet by mouth daily. 07/29/19   [provider]  Lancets (FREESTYLE) lancets Use to check blood sugar ICD10: R73.01.Dispense based on insurance preference 02/09/18   Susy Frizzle, MD  levocetirizine (XYZAL) 5 MG tablet Take 5 mg by mouth every evening.    [provider]  losartan (COZAAR) 25 MG tablet TAKE 1 TABLET (25 MG TOTAL) BY MOUTH DAILY. 04/13/21   Susy Frizzle, MD  metFORMIN (GLUCOPHAGE-XR) 500 MG 24 hr tablet TAKE 2 TABLETS BY MOUTH EVERY DAY WITH BREAKFAST 09/15/20   Susy Frizzle, MD  Mouthwash Compounding Base LIQD Swish and spit 15 mLs every 6 (six) hours as needed. Compounding formula: Maalox 87m, Lidocaine viscous 2% 658m Benadryl 12.59m81mml 60 ml Patient not taking: Reported on 06/05/2021 05/02/21   LamChase PicketD  pravastatin (PRAVACHOL) 20 MG tablet Take 1 tablet (20 mg total) by mouth daily.  09/24/20   PicSusy FrizzleD  predniSONE (DELTASONE) 10 MG tablet Take 10 mg by mouth See admin instructions. Take per package directions Patient not taking: Reported on 06/05/2021    [provider]  triamterene-hydrochlorothiazide (MAXZIDE-25) 37.5-25 MG tablet Take 1 tablet by mouth every morning. 04/02/21   [provider]    Allergies    Augmentin [amoxicillin-pot clavulanate], Codeine, Ciprofloxacin hcl, Elemental sulfur, Erythromycin, and Sulfa antibiotics  Review of Systems   Review of Systems  Gastrointestinal:  Positive for hematochezia.  All other systems reviewed and are negative.  Physical Exam Updated Vital Signs BP 129/76   Pulse 78   Temp 99.3 F (37.4 C) (Oral)   Resp 14   Ht 5' 4"  (1.626 m)   Wt 84.4 kg  SpO2 98%   BMI 31.93 kg/m   Physical Exam Vitals and nursing note reviewed.  Constitutional:      General: She is not in acute distress.    Appearance: Normal appearance. She is well-developed.  HENT:     Head: Normocephalic and atraumatic.  Eyes:     Conjunctiva/sclera: Conjunctivae normal.     Pupils: Pupils are equal, round, and reactive to light.  Cardiovascular:     Rate and Rhythm: Normal rate and regular rhythm.     Heart sounds: Normal heart sounds.  Pulmonary:     Effort: Pulmonary effort is normal. No respiratory distress.     Breath sounds: Normal breath sounds.  Abdominal:     General: There is no distension.     Palpations: Abdomen is soft.     Tenderness: There is no abdominal tenderness.  Genitourinary:    Comments: Thrombosed external hemorrhoid with evidence of recent bleeding.  No surrounding erythema or signs of abscess or cellulitis.  Hemorrhoid itself is measuring roughly 1 x 0.5 cm. Musculoskeletal:        General: No deformity. Normal range of motion.     Cervical back: Normal range of motion and neck supple.  Skin:    General: Skin is warm and dry.  Neurological:     General: No focal deficit present.      Mental Status: She is alert and oriented to person, place, and time.    ED Results / Procedures / Treatments   Labs (all labs ordered are listed, but only abnormal results are displayed) Labs Reviewed  CBC WITH DIFFERENTIAL/PLATELET - Abnormal; Notable for the following components:      Result Value   RBC 5.40 (*)    Hemoglobin 16.3 (*)    HCT 47.8 (*)    All other components within normal limits  BASIC METABOLIC PANEL - Abnormal; Notable for the following components:   Potassium 3.2 (*)    Glucose, Bld 134 (*)    All other components within normal limits  TYPE AND SCREEN  ABO/RH    EKG None  Radiology No results found.  Procedures Procedures   Medications Ordered in ED Medications  sodium chloride 0.9 % bolus 500 mL (0 mLs Intravenous Stopped 06/05/21 1946)  morphine 4 MG/ML injection 4 mg (4 mg Intravenous Given 06/05/21 1851)  HYDROmorphone (DILAUDID) injection 0.5 mg (0.5 mg Intravenous Given 06/05/21 1946)    ED Course  I have reviewed the triage vital signs and the nursing notes.  Pertinent labs & imaging results that were available during my care of the patient were reviewed by me and considered in my medical decision making (see chart for details).    MDM Rules/Calculators/A&P                           MDM  MSE complete  Candice Jones was evaluated in Emergency Department on 06/05/2021 for the symptoms described in the history of present illness. She was evaluated in the context of the global COVID-19 pandemic, which necessitated consideration that the patient might be at risk for infection with the SARS-CoV-2 virus that causes COVID-19. Institutional protocols and algorithms that pertain to the evaluation of patients at risk for COVID-19 are in a state of rapid change based on information released by regulatory bodies including the CDC and federal and state organizations. These policies and algorithms were followed during the patient's care in the  ED.  Patient presented  with complaint of hemorrhoid pain.  Patient is now on at least day 5 of symptoms related to thrombosed external hemorrhoid.  She has had some bleeding.  Hemorrhoid itself is thrombosed.  There is no sign of infection.  Patient with reported bleeding related to the hemorrhoid.  Patient does feel significantly improved after administration of pain medicine.  Screening labs obtained are without significant abnormality.  Patient would not benefit from incision of her hemorrhoid.  Patient feels improved at time of discharge.  She does understand need for close follow-up.  Strict return precautions given and understood.  Final Clinical Impression(s) / ED Diagnoses Final diagnoses:  External thrombosed hemorrhoids    Rx / DC Orders ED Discharge Orders          Ordered    oxyCODONE (ROXICODONE) 5 MG immediate release tablet  Every 6 hours PRN        06/05/21 2131             Valarie Merino, MD 06/05/21 2135

## 2021-06-05 NOTE — ED Triage Notes (Signed)
Pt seen last Tuesday night for hemorrhoid.  She believes it ruptured Wednesday due to bleeding she saw.  Pt also c/o left lower back pain that radiates to her left leg. Pt states pain has gotten progressively worse.

## 2021-06-05 NOTE — Discharge Instructions (Signed)
Return for any problem.  ?

## 2021-06-22 ENCOUNTER — Ambulatory Visit
Admission: EM | Admit: 2021-06-22 | Discharge: 2021-06-22 | Disposition: A | Discharge status: Home / Self Care | Payer: BC Managed Care – PPO | Attending: Family Medicine | Admitting: Family Medicine

## 2021-06-22 ENCOUNTER — Ambulatory Visit (INDEPENDENT_AMBULATORY_CARE_PROVIDER_SITE_OTHER): Payer: BC Managed Care – PPO

## 2021-06-22 ENCOUNTER — Encounter: Payer: Self-pay | Admitting: Emergency Medicine

## 2021-06-22 DIAGNOSIS — R0602 Shortness of breath: Secondary | ICD-10-CM | POA: Diagnosis not present

## 2021-06-22 DIAGNOSIS — Z1152 Encounter for screening for COVID-19: Secondary | ICD-10-CM

## 2021-06-22 DIAGNOSIS — J22 Unspecified acute lower respiratory infection: Secondary | ICD-10-CM

## 2021-06-22 DIAGNOSIS — R059 Cough, unspecified: Secondary | ICD-10-CM

## 2021-06-22 MED ORDER — DOXYCYCLINE HYCLATE 100 MG PO CAPS: 100.0000 mg | ORAL_CAPSULE | Freq: Two times a day (BID) | ORAL | 0 refills | Status: DC

## 2021-06-22 MED ORDER — PREDNISONE 20 MG PO TABS: 40.0000 mg | ORAL_TABLET | Freq: Every day | ORAL | 0 refills | Status: DC

## 2021-06-22 MED ORDER — PROMETHAZINE-DM 6.25-15 MG/5ML PO SYRP: 5.0000 mL | ORAL_SOLUTION | Freq: Three times a day (TID) | ORAL | 0 refills | Status: DC | PRN

## 2021-06-22 MED ORDER — ALBUTEROL SULFATE HFA 108 (90 BASE) MCG/ACT IN AERS
2.0000 | INHALATION_SPRAY | Freq: Once | RESPIRATORY_TRACT | Status: AC
  Administered 2021-06-22: 2 via RESPIRATORY_TRACT

## 2021-06-22 MED ORDER — ACETAMINOPHEN 325 MG PO TABS
650.0000 mg | ORAL_TABLET | Freq: Once | ORAL | Status: AC
  Administered 2021-06-22: 650 mg via ORAL

## 2021-06-22 NOTE — ED Triage Notes (Signed)
Pt c/o cough and pain in her chest while coughing x 4 days

## 2021-06-22 NOTE — ED Provider Notes (Signed)
Roderic Palau    CSN: [id number] Arrival date & time: 06/22/21  1119      History   Chief Complaint Chief Complaint  Patient presents with   Fever   Cough    HPI Candice Jones is a 45 y.o. female.   HPI Patient presents today with a concern of fever and cough x2 days.  Patient works as a Radio producer and is uncertain of any known exposure to Flu/COVID. She reports taking otc for management of fever. Associated symptoms of fatigue, poor appetite, generalize body aches. Endorses increased work of breathing and chest heaviness today. Past Medical History:  Diagnosis Date   Adenomyosis    Asthma    COVID-19    Diabetes ([place])    Gall stones    Gestational diabetes mellitus    Hyperlipidemia    IBS (irritable bowel syndrome)    Internal hemorrhoids    Kidney stones    Meniere's syndrome    Vertigo     Patient Active Problem List   Diagnosis Date Noted   Family history of colon cancer - sister < 60 02/02/2017   Internal hemorrhoids with bleeding and prolapse 06/24/2014   IBS (irritable bowel syndrome) 06/24/2014   Endometriosis 03/03/2013   Elevated fasting glucose 03/01/2013   Gallstones 05/12/2011    Past Surgical History:  Procedure Laterality Date   CESAREAN SECTION  08/1999, 05/2003   CHOLECYSTECTOMY  2011   CYSTECTOMY  1997   left wrist    ENDOMETRIAL ABLATION  01/2010   HEMORRHOID BANDING  2015   MINOR HEMORRHOIDECTOMY  04/2014   thrombosed   WISDOM TOOTH EXTRACTION  1996/1997    OB History   No obstetric history on file.      Home Medications    Prior to Admission medications   Medication Sig Start Date End Date Taking? Authorizing Provider  doxycycline (VIBRAMYCIN) 100 MG capsule Take 1 capsule (100 mg total) by mouth 2 (two) times daily. 06/22/21  Yes Scot Jun, FNP  predniSONE (DELTASONE) 20 MG tablet Take 2 tablets (40 mg total) by mouth daily with breakfast. 06/22/21  Yes Scot Jun, FNP   promethazine-dextromethorphan (PROMETHAZINE-DM) 6.25-15 MG/5ML syrup Take 5 mLs by mouth 3 (three) times daily as needed for cough. 06/22/21  Yes Scot Jun, FNP  acetaminophen (TYLENOL) 500 MG tablet Take 1,000 mg by mouth every 6 (six) hours as needed (pain).    [provider]  albuterol (VENTOLIN HFA) 108 (90 Base) MCG/ACT inhaler Inhale 2 puffs into the lungs every 4 (four) hours as needed for wheezing or shortness of breath. 02/11/20   Annie Main, FNP  Ascorbic Acid (VITAMIN C PO) Take 1 tablet by mouth every morning.    [provider]  Azelastine HCl 137 MCG/SPRAY SOLN Place 2 sprays into both nostrils every morning. 04/13/21   [provider]  Blood Glucose Monitoring Suppl KIT Use to check fasting glucose daily ICD10: R73.01.Dispense based on insurance preference 07/30/19   Susy Frizzle, MD  Cholecalciferol (VITAMIN D3 PO) Take 1 tablet by mouth every morning.    [provider]  Cranberry-Vitamin C-Vitamin E (CRANBERRY PLUS VITAMIN C) 4200-20-3 MG-MG-UNIT CAPS Take 2 capsules by mouth every morning.    [provider]  docusate sodium (COLACE) 100 MG capsule Take 100 mg by mouth every morning.    [provider]  fluticasone (FLONASE) 50 MCG/ACT nasal spray SPRAY 2 SPRAYS INTO EACH NOSTRIL EVERY DAY Patient taking differently: Place 2  sprays into both nostrils daily as needed for allergies or rhinitis. 03/19/20   Ishmael Holter A, FNP  glucose blood test strip Use with glucose meter to check blood sugar.ICD10: R73.01.Dispense based on insurance preference 02/09/18   Susy Frizzle, MD  Lancets (FREESTYLE) lancets Use to check blood sugar ICD10: R73.01.Dispense based on insurance preference 02/09/18   Susy Frizzle, MD  levocetirizine (XYZAL) 5 MG tablet Take 5 mg by mouth every morning.    [provider]  Lido-PE-Glycerin-Petrolatum (PREPARATION H RAPID RELIEF) 5-0.25-14.4-15 % CREA Place 1 application  rectally as needed (after each bowel movement).    [provider]  losartan (COZAAR) 25 MG tablet TAKE 1 TABLET (25 MG TOTAL) BY MOUTH DAILY. Patient taking differently: Take 25 mg by mouth every morning. 04/13/21   Susy Frizzle, MD  metFORMIN (GLUCOPHAGE-XR) 500 MG 24 hr tablet TAKE 2 TABLETS BY MOUTH EVERY DAY WITH BREAKFAST Patient taking differently: Take 1,000 mg by mouth daily with breakfast. 09/15/20   Susy Frizzle, MD  Mouthwash Compounding Base LIQD Swish and spit 15 mLs every 6 (six) hours as needed. Compounding formula: Maalox 31m, Lidocaine viscous 2% 620m Benadryl 12.58m32mml 60 ml Patient not taking: No sig reported 05/02/21   LamChase PicketD  Multiple Vitamin (MULTIVITAMIN WITH MINERALS) TABS tablet Take 1 tablet by mouth every morning.    [provider]  Multiple Vitamins-Minerals (IMMUNE SUPPORT) CHEW Chew 2 tablets by mouth every morning.    [provider]  norethindrone-ethinyl estradiol (LOESTRIN) 1-20 MG-MCG tablet Take 1 tablet by mouth every morning.    [provider]  oxyCODONE (ROXICODONE) 5 MG immediate release tablet Take 1 tablet (5 mg total) by mouth every 6 (six) hours as needed for severe pain. 06/05/21   MesValarie MerinoD  PEPPERMINT OIL PO Take 1 capsule by mouth every morning.    [provider]  pravastatin (PRAVACHOL) 20 MG tablet Take 1 tablet (20 mg total) by mouth daily. 09/24/20   PicSusy FrizzleD  triamterene-hydrochlorothiazide (MAXZIDE-25) 37.5-25 MG tablet Take 1 tablet by mouth every morning. 04/02/21   [provider]  VITAMIN A PO Take 1 capsule by mouth every morning.    [provider]  witch hazel-glycerin (TUCKS) pad Apply 1 application topically as needed for hemorrhoids (after each visit to restroom).    [provider]    Family History Family History  Problem Relation Age of Onset   High Cholesterol Mother    Hypertension Mother    Heart disease  Mother    Diabetes Father    High Cholesterol Father    Colon cancer Sister        negative for Lynch syndrome   Diverticulitis Sister    Diabetes Maternal Grandmother    Heart disease Maternal Grandmother    Hypertension Maternal Grandmother    High Cholesterol Maternal Grandmother    Kidney cancer Maternal Grandmother    Heart disease Maternal Grandfather    High Cholesterol Maternal Grandfather    Cancer Maternal Grandfather    Diabetes Paternal Grandmother    Prostate cancer Paternal Grandfather    Lupus Sister     Social History Social History   Tobacco Use   Smoking status: Never   Smokeless tobacco: Never  Vaping Use   Vaping Use: Never used  Substance Use Topics   Alcohol use: No   Drug use: No     Allergies   Augmentin [amoxicillin-pot clavulanate], Codeine, Ciprofloxacin hcl,  Elemental sulfur, Erythromycin, and Sulfa antibiotics   Review of Systems Review of Systems Pertinent negatives listed in HPI]   Physical Exam Triage Vital Signs ED Triage Vitals [06/22/21 1157]  Enc Vitals Group     BP 127/84     Pulse Rate (!) 125     Resp      Temp 99.7 F (37.6 C)     Temp Source Oral     SpO2 97 %     Weight      Height      Head Circumference      Peak Flow      Pain Score      Pain Loc      Pain Edu?      Excl. in [place]?    No data found.  Updated Vital Signs BP 127/84 (BP Location: Left Arm)   Pulse (!) 125   Temp 99.7 F (37.6 C) (Oral)   SpO2 97%   Visual Acuity Right Eye Distance:   Left Eye Distance:   Bilateral Distance:    Right Eye Near:   Left Eye Near:    Bilateral Near:     Physical Exam General appearance: alert, Ill-appearing, no distress Head: Normocephalic, without obvious abnormality, atraumatic ENT: Ears TM normal, mucosal edema, congestion, erythematous oropharynx w/o exudate Respiratory: Respirations even , unlabored, coarse lung sound, expiratory wheeze Heart: Rate and rhythm normal. No gallop or murmurs noted  on exam  Abdomen: BS +, no distention, no rebound tenderness, or no mass Extremities: No gross deformities Skin: Skin color, texture, turgor normal. No rashes seen  Psych: Appropriate mood and affect. Neurologic: No focal neurological deficits present    UC Treatments / Results  Labs (all labs ordered are listed, but only abnormal results are displayed) Labs Reviewed  COVID-19, FLU A+B NAA - Abnormal; Notable for the following components:      Result Value   Influenza A, NAA Detected (*)    All other components within normal limits   Narrative:    Performed at:  [address] [place], [place]  [id number] Lab Director: Rush Farmer MD, Phone:  [phone]    EKG   Radiology No results found.  Procedures Procedures (including critical care time)  Medications Ordered in UC Medications  albuterol (VENTOLIN HFA) 108 (90 Base) MCG/ACT inhaler 2 puff (2 puffs Inhalation Given 06/22/21 1228)  acetaminophen (TYLENOL) tablet 650 mg (650 mg Oral Given 06/22/21 1256)    Initial Impression / Assessment and Plan / UC Course  I have reviewed the triage vital signs and the nursing notes.  Pertinent labs & imaging results that were available during my care of the patient were reviewed by me and considered in my medical decision making (see chart for details).    Lower respiratory infection, likely secondary viral illness with cough Suspicious or COVID or Flu  Treatment per discharge medication orders. Red flag precautions discussed warranting emergent follow-up at ER RTC PRN  Final Clinical Impressions(s) / UC Diagnoses   Final diagnoses:  Encounter for screening for COVID-19  Lower respiratory infection (e.g., bronchitis, pneumonia, pneumonitis, pulmonitis)  Cough, unspecified type     Discharge Instructions      Your COVID/Flu test results should result within 3-5 days. Complete all medication as prescribed. Negative results are immediately  resulted to Mychart. Positive results will receive a follow-up call from our clinic. If symptoms are present, I recommend home quarantine until results are known.  Alternate Tylenol and  ibuprofen as needed for body aches and fever.  Symptom management per recommendations discussed today.  If any breathing difficulty or chest pain develops go immediately to the closest emergency department for evaluation.      ED Prescriptions     Medication Sig Dispense Auth. Provider   predniSONE (DELTASONE) 20 MG tablet Take 2 tablets (40 mg total) by mouth daily with breakfast. 10 tablet Scot Jun, FNP   promethazine-dextromethorphan (PROMETHAZINE-DM) 6.25-15 MG/5ML syrup Take 5 mLs by mouth 3 (three) times daily as needed for cough. 140 mL Scot Jun, FNP   doxycycline (VIBRAMYCIN) 100 MG capsule Take 1 capsule (100 mg total) by mouth 2 (two) times daily. 20 capsule Scot Jun, FNP      PDMP not reviewed this encounter.   Scot Jun,  06/27/21 [phone]

## 2021-06-22 NOTE — Discharge Instructions (Signed)
Your COVID/Flu test results should result within 3-5 days. Complete all medication as prescribed. Negative results are immediately resulted to Mychart. Positive results will receive a follow-up call from our clinic. If symptoms are present, I recommend home quarantine until results are known.  Alternate Tylenol and ibuprofen as needed for body aches and fever.  Symptom management per recommendations discussed today.  If any breathing difficulty or chest pain develops go immediately to the closest emergency department for evaluation.

## 2021-06-23 ENCOUNTER — Ambulatory Visit: Payer: BC Managed Care – PPO

## 2021-06-24 LAB — COVID-19, FLU A+B NAA
Influenza A, NAA: DETECTED — AB
Influenza B, NAA: NOT DETECTED
SARS-CoV-2, NAA: NOT DETECTED

## 2021-08-09 ENCOUNTER — Encounter: Payer: Self-pay | Admitting: Family Medicine

## 2021-08-09 ENCOUNTER — Other Ambulatory Visit: Payer: Self-pay | Admitting: Family Medicine

## 2021-08-10 ENCOUNTER — Ambulatory Visit
Admission: EM | Admit: 2021-08-10 | Discharge: 2021-08-10 | Disposition: A | Discharge status: Home / Self Care | Payer: BC Managed Care – PPO | Attending: Family Medicine | Admitting: Family Medicine

## 2021-08-10 ENCOUNTER — Telehealth: Payer: BC Managed Care – PPO | Admitting: Family Medicine

## 2021-08-10 ENCOUNTER — Other Ambulatory Visit: Payer: Self-pay

## 2021-08-10 DIAGNOSIS — Z20828 Contact with and (suspected) exposure to other viral communicable diseases: Secondary | ICD-10-CM | POA: Diagnosis not present

## 2021-08-10 DIAGNOSIS — J329 Chronic sinusitis, unspecified: Secondary | ICD-10-CM | POA: Diagnosis not present

## 2021-08-10 DIAGNOSIS — B9789 Other viral agents as the cause of diseases classified elsewhere: Secondary | ICD-10-CM

## 2021-08-10 DIAGNOSIS — R52 Pain, unspecified: Secondary | ICD-10-CM | POA: Diagnosis not present

## 2021-08-10 DIAGNOSIS — J4520 Mild intermittent asthma, uncomplicated: Secondary | ICD-10-CM | POA: Diagnosis not present

## 2021-08-10 MED ORDER — PRAVASTATIN SODIUM 20 MG PO TABS: 20.0000 mg | ORAL_TABLET | Freq: Every day | ORAL | 3 refills | Status: DC

## 2021-08-10 MED ORDER — DEXAMETHASONE SODIUM PHOSPHATE 10 MG/ML IJ SOLN
10.0000 mg | Freq: Once | INTRAMUSCULAR | Status: AC
  Administered 2021-08-10: 10 mg via INTRAMUSCULAR

## 2021-08-10 MED ORDER — DOXYCYCLINE HYCLATE 100 MG PO CAPS: 100.0000 mg | ORAL_CAPSULE | Freq: Two times a day (BID) | ORAL | 0 refills | Status: DC

## 2021-08-10 NOTE — Discharge Instructions (Signed)
Only take the antibiotic if you are worsening over the next 4-5 days

## 2021-08-10 NOTE — ED Provider Notes (Signed)
[facility]    CSN: [id number] Arrival date & time: 08/10/21  1308      History   Chief Complaint Chief Complaint  Patient presents with   Otalgia    Facial pain, headache, and ear ache    HPI Candice Jones is a 46 y.o. female.   Patient presenting today with 6-day history of sore throat, fever, chills, body aches, fatigue, nasal congestion, sinus pain and pressure, hoarseness, cough.  Denies chest pain, shortness of breath, abdominal pain, nausea vomiting or diarrhea.  Taking Astelin nasal spray, Flonase, cold and congestion medication with no relief of symptoms.  History of allergies, asthma on albuterol as needed.   Past Medical History:  Diagnosis Date   Adenomyosis    Asthma    COVID-19    Diabetes ([place])    Gall stones    Gestational diabetes mellitus    Hyperlipidemia    IBS (irritable bowel syndrome)    Internal hemorrhoids    Kidney stones    Meniere's syndrome    Vertigo     Patient Active Problem List   Diagnosis Date Noted   Family history of colon cancer - sister < 60 02/02/2017   Internal hemorrhoids with bleeding and prolapse 06/24/2014   IBS (irritable bowel syndrome) 06/24/2014   Endometriosis 03/03/2013   Elevated fasting glucose 03/01/2013   Gallstones 05/12/2011    Past Surgical History:  Procedure Laterality Date   CESAREAN SECTION  08/1999, 05/2003   CHOLECYSTECTOMY  2011   CYSTECTOMY  1997   left wrist    ENDOMETRIAL ABLATION  01/2010   HEMORRHOID BANDING  2015   MINOR HEMORRHOIDECTOMY  04/2014   thrombosed   WISDOM TOOTH EXTRACTION  1996/1997    OB History   No obstetric history on file.      Home Medications    Prior to Admission medications   Medication Sig Start Date End Date Taking? Authorizing Provider  acetaminophen (TYLENOL) 500 MG tablet Take 1,000 mg by mouth every 6 (six) hours as needed (pain).    [provider]  albuterol (VENTOLIN HFA) 108 (90 Base) MCG/ACT inhaler Inhale 2 puffs into the  lungs every 4 (four) hours as needed for wheezing or shortness of breath. 02/11/20   Annie Main, FNP  Ascorbic Acid (VITAMIN C PO) Take 1 tablet by mouth every morning.    [provider]  Azelastine HCl 137 MCG/SPRAY SOLN Place 2 sprays into both nostrils every morning. 04/13/21   [provider]  Blood Glucose Monitoring Suppl KIT Use to check fasting glucose daily ICD10: R73.01.Dispense based on insurance preference 07/30/19   Susy Frizzle, MD  Cholecalciferol (VITAMIN D3 PO) Take 1 tablet by mouth every morning.    [provider]  Cranberry-Vitamin C-Vitamin E (CRANBERRY PLUS VITAMIN C) 4200-20-3 MG-MG-UNIT CAPS Take 2 capsules by mouth every morning.    [provider]  docusate sodium (COLACE) 100 MG capsule Take 100 mg by mouth every morning.    [provider]  doxycycline (VIBRAMYCIN) 100 MG capsule Take 1 capsule (100 mg total) by mouth 2 (two) times daily. Do not take unless worsening over the next 4-5 days 08/10/21   Volney American, PA-C  fluticasone [facility]) 50 MCG/ACT nasal spray SPRAY 2 SPRAYS INTO EACH NOSTRIL EVERY DAY Patient taking differently: Place 2 sprays into both nostrils daily as needed for allergies or rhinitis. 03/19/20   Ishmael Holter A, FNP  glucose blood test strip Use with glucose meter  to check blood sugar.ICD10: R73.01.Dispense based on insurance preference 02/09/18   Susy Frizzle, MD  Lancets (FREESTYLE) lancets Use to check blood sugar ICD10: R73.01.Dispense based on insurance preference 02/09/18   Susy Frizzle, MD  levocetirizine (XYZAL) 5 MG tablet Take 5 mg by mouth every morning.    [provider]  Lido-PE-Glycerin-Petrolatum (PREPARATION H RAPID RELIEF) 5-0.25-14.4-15 % CREA Place 1 application rectally as needed (after each bowel movement).    [provider]  losartan (COZAAR) 25 MG tablet TAKE 1 TABLET (25 MG TOTAL) BY MOUTH DAILY. Patient taking differently: Take 25 mg  by mouth every morning. 04/13/21   Susy Frizzle, MD  metFORMIN (GLUCOPHAGE-XR) 500 MG 24 hr tablet TAKE 2 TABLETS BY MOUTH EVERY DAY WITH BREAKFAST 08/09/21   Susy Frizzle, MD  Mouthwash Compounding Base LIQD Swish and spit 15 mLs every 6 (six) hours as needed. Compounding formula: Maalox 14m, Lidocaine viscous 2% 662m Benadryl 12.46m64mml 60 ml Patient not taking: Reported on 06/05/2021 05/02/21   LamChase PicketD  Multiple Vitamin (MULTIVITAMIN WITH MINERALS) TABS tablet Take 1 tablet by mouth every morning.    [provider]  Multiple Vitamins-Minerals (IMMUNE SUPPORT) CHEW Chew 2 tablets by mouth every morning.    [provider]  norethindrone-ethinyl estradiol (LOESTRIN) 1-20 MG-MCG tablet Take 1 tablet by mouth every morning.    [provider]  oxyCODONE (ROXICODONE) 5 MG immediate release tablet Take 1 tablet (5 mg total) by mouth every 6 (six) hours as needed for severe pain. 06/05/21   MesValarie MerinoD  PEPPERMINT OIL PO Take 1 capsule by mouth every morning.    [provider]  pravastatin (PRAVACHOL) 20 MG tablet Take 1 tablet (20 mg total) by mouth daily. 09/24/20   PicSusy FrizzleD  promethazine-dextromethorphan (PROMETHAZINE-DM) 6.25-15 MG/5ML syrup Take 5 mLs by mouth 3 (three) times daily as needed for cough. 06/22/21   HarScot JunNP  triamterene-hydrochlorothiazide (MAXZIDE-25) 37.5-25 MG tablet Take 1 tablet by mouth every morning. 04/02/21   [provider]  VITAMIN A PO Take 1 capsule by mouth every morning.    [provider]  witch hazel-glycerin (TUCKS) pad Apply 1 application topically as needed for hemorrhoids (after each visit to restroom).    [provider]    Family History Family History  Problem Relation Age of Onset   High Cholesterol Mother    Hypertension Mother    Heart disease Mother    Diabetes Father    High Cholesterol Father    Colon cancer Sister         negative for Lynch syndrome   Diverticulitis Sister    Diabetes Maternal Grandmother    Heart disease Maternal Grandmother    Hypertension Maternal Grandmother    High Cholesterol Maternal Grandmother    Kidney cancer Maternal Grandmother    Heart disease Maternal Grandfather    High Cholesterol Maternal Grandfather    Cancer Maternal Grandfather    Diabetes Paternal Grandmother    Prostate cancer Paternal Grandfather    Lupus Sister     Social History Social History   Tobacco Use   Smoking status: Never   Smokeless tobacco: Never  Vaping Use   Vaping Use: Never used  Substance Use Topics   Alcohol use: Yes    Comment: rarely   Drug use: No     Allergies   Augmentin [amoxicillin-pot clavulanate], Codeine, Ciprofloxacin hcl, Elemental sulfur, Erythromycin, and Sulfa antibiotics  Review of Systems Review of Systems Per HPI  Physical Exam Triage Vital Signs ED Triage Vitals  Enc Vitals Group     BP 08/10/21 0904 138/81     Pulse Rate 08/10/21 0904 (!) 117     Resp 08/10/21 0904 18     Temp 08/10/21 0904 99.4 F (37.4 C)     Temp Source 08/10/21 0904 Oral     SpO2 08/10/21 0904 97 %     Weight --      Height --      Head Circumference --      Peak Flow --      Pain Score 08/10/21 0859 4     Pain Loc --      Pain Edu? --      Excl. in [place]? --    No data found.  Updated Vital Signs BP 138/81 (BP Location: Right Arm)    Pulse (!) 117    Temp 99.4 F (37.4 C) (Oral)    Resp 18    SpO2 97%   Visual Acuity Right Eye Distance:   Left Eye Distance:   Bilateral Distance:    Right Eye Near:   Left Eye Near:    Bilateral Near:     Physical Exam Vitals and nursing note reviewed.  Constitutional:      Appearance: Normal appearance.  HENT:     Head: Atraumatic.     Right Ear: Tympanic membrane and external ear normal.     Left Ear: Tympanic membrane and external ear normal.     Nose: Rhinorrhea present.     Mouth/Throat:     Mouth: Mucous membranes are  moist.     Pharynx: Posterior oropharyngeal erythema present. No oropharyngeal exudate.     Comments: No tonsillar edema, uvula midline, oral airway patent Eyes:     Extraocular Movements: Extraocular movements intact.     Conjunctiva/sclera: Conjunctivae normal.  Cardiovascular:     Rate and Rhythm: Normal rate and regular rhythm.     Heart sounds: Normal heart sounds.  Pulmonary:     Effort: Pulmonary effort is normal. No respiratory distress.     Breath sounds: Normal breath sounds. No wheezing or rales.  Musculoskeletal:        General: Normal range of motion.     Cervical back: Normal range of motion and neck supple.  Skin:    General: Skin is warm and dry.  Neurological:     Mental Status: She is alert and oriented to person, place, and time.  Psychiatric:        Mood and Affect: Mood normal.        Thought Content: Thought content normal.   UC Treatments / Results  Labs (all labs ordered are listed, but only abnormal results are displayed) Labs Reviewed  COVID-19, FLU A+B NAA   EKG  Radiology No results found.  Procedures Procedures (including critical care time)  Medications Ordered in UC Medications  dexamethasone (DECADRON) injection 10 mg (10 mg Intramuscular Given 08/10/21 0942)   Initial Impression / Assessment and Plan / UC Course  I have reviewed the triage vital signs and the nursing notes.  Pertinent labs & imaging results that were available during my care of the patient were reviewed by me and considered in my medical decision making (see chart for details).     Mildly tachycardic in triage, otherwise vital signs benign and reassuring.  Overall exam suspicious for COVID-like illness.  COVID and flu testing pending,  IM Decadron given for sinus inflammation, cough particularly given her history of asthma underlying.  She is concerned about having a bacterial sinus infection, discussed that I would send in doxycycline but that she should allow 4-5 more  days at minimum for the steroid to help resolve her symptoms prior to initiating this medication in hopes of avoiding it.  Work note given.  Return for acutely worsening symptoms.  Final Clinical Impressions(s) / UC Diagnoses   Final diagnoses:  Exposure to the flu  Viral sinusitis  Generalized body aches  Mild intermittent asthma without complication     Discharge Instructions      Only take the antibiotic if you are worsening over the next 4-5 days    ED Prescriptions     Medication Sig Dispense Auth. Provider   doxycycline (VIBRAMYCIN) 100 MG capsule Take 1 capsule (100 mg total) by mouth 2 (two) times daily. Do not take unless worsening over the next 4-5 days 14 capsule Volney American, [place]      PDMP not reviewed this encounter.   Merrie Roof [place], [place] 08/10/21 [phone]

## 2021-08-10 NOTE — ED Triage Notes (Signed)
Patient states that this morning she woke up and can not talk.   Patient states that last Wednesday she woke up with a sore throat and everything progressed.  Patient states she has had fevers, facial pain, sore throat and legs are achy.  Denies Meds

## 2021-08-11 ENCOUNTER — Ambulatory Visit: Payer: BC Managed Care – PPO | Admitting: Nurse Practitioner

## 2021-08-11 LAB — COVID-19, FLU A+B NAA
Influenza A, NAA: NOT DETECTED
Influenza B, NAA: NOT DETECTED
SARS-CoV-2, NAA: NOT DETECTED

## 2021-08-13 ENCOUNTER — Encounter: Payer: Self-pay | Admitting: Family Medicine

## 2021-08-27 ENCOUNTER — Encounter: Payer: Self-pay | Admitting: Family Medicine

## 2021-08-27 ENCOUNTER — Ambulatory Visit: Payer: BC Managed Care – PPO | Admitting: Family Medicine

## 2021-08-27 ENCOUNTER — Other Ambulatory Visit: Payer: Self-pay

## 2021-08-27 VITALS — BP 128/88 | HR 108 | Temp 97.2°F | Resp 18 | Ht 64.0 in | Wt 196.0 lb

## 2021-08-27 DIAGNOSIS — R058 Other specified cough: Secondary | ICD-10-CM | POA: Diagnosis not present

## 2021-08-27 DIAGNOSIS — R5383 Other fatigue: Secondary | ICD-10-CM | POA: Diagnosis not present

## 2021-08-27 MED ORDER — PANTOPRAZOLE SODIUM 40 MG PO TBEC: 40.0000 mg | DELAYED_RELEASE_TABLET | Freq: Every day | ORAL | 3 refills | Status: DC

## 2021-08-27 NOTE — Progress Notes (Signed)
Subjective:    Patient ID: Candice Jones, female    DOB: 1975/12/18, 46 y.o.   MRN: [id number]  HPI  Patient states that ever since she had COVID, she is always sick.  She states that she has been sick and had to go to an urgent care more than 6 times in the last 12 months.  She states that every time she gets a cold, and lingers for several weeks.  She is always coughing.  Cough is nonproductive.  She is questioning if she has asthma.  She wonders if there is something wrong with her immune system.  She questions why she always getting sick.  Patient states that she is more often than not coughing over the last 6 months.  Is almost on a daily basis.  She reports 3.  The cough is nonproductive.  She denies any hemoptysis or purulent sputum.  She denies any wheezing or pleurisy.  She denies any fevers or chills or night sweats or bone pain or bruising.  She denies any weight loss.  She denies any exposure to HIV or tuberculosis.  She denies any joint pains or rashes.  She does have a strong family history of autoimmune diseases including lupus and autoimmune hepatitis.  She also reports daily acid reflux.  She has no previous history of smoking.  She does have intermittent wheezing and asthma   Past Medical History:  Diagnosis Date   Adenomyosis    Asthma    COVID-19    Diabetes ([place])    Gall stones    Gestational diabetes mellitus    Hyperlipidemia    IBS (irritable bowel syndrome)    Internal hemorrhoids    Kidney stones    Meniere's syndrome    Vertigo    Past Surgical History:  Procedure Laterality Date   CESAREAN SECTION  08/1999, 05/2003   CHOLECYSTECTOMY  2011   CYSTECTOMY  1997   left wrist    ENDOMETRIAL ABLATION  01/2010   HEMORRHOID BANDING  2015   MINOR HEMORRHOIDECTOMY  04/2014   thrombosed   [place] EXTRACTION  1996/1997   Current Outpatient Medications on File Prior to Visit  Medication Sig Dispense Refill   acetaminophen (TYLENOL) 500 MG tablet Take 1,000 mg by  mouth every 6 (six) hours as needed (pain).     albuterol (VENTOLIN HFA) 108 (90 Base) MCG/ACT inhaler Inhale 2 puffs into the lungs every 4 (four) hours as needed for wheezing or shortness of breath. 18 g 0   Ascorbic Acid (VITAMIN C PO) Take 1 tablet by mouth every morning.     Azelastine HCl 137 MCG/SPRAY SOLN Place 2 sprays into both nostrils every morning.     Blood Glucose Monitoring Suppl KIT Use to check fasting glucose daily ICD10: R73.01.Dispense based on insurance preference 1 kit 0   Cholecalciferol (VITAMIN D3 PO) Take 1 tablet by mouth every morning.     Cranberry-Vitamin C-Vitamin E (CRANBERRY PLUS VITAMIN C) 4200-20-3 MG-MG-UNIT CAPS Take 2 capsules by mouth every morning.     docusate sodium (COLACE) 100 MG capsule Take 100 mg by mouth every morning.     fluticasone (FLONASE) 50 MCG/ACT nasal spray SPRAY 2 SPRAYS INTO EACH NOSTRIL EVERY DAY (Patient taking differently: Place 2 sprays into both nostrils daily as needed for allergies or rhinitis.) 48 mL 1   glucose blood test strip Use with glucose meter to check blood sugar.ICD10: R73.01.Dispense based on insurance preference 100 each 12   Lancets (FREESTYLE)  lancets Use to check blood sugar ICD10: R73.01.Dispense based on insurance preference 100 each 12   levocetirizine (XYZAL) 5 MG tablet Take 5 mg by mouth every morning.     Lido-PE-Glycerin-Petrolatum (PREPARATION H RAPID RELIEF) 5-0.25-14.4-15 % CREA Place 1 application rectally as needed (after each bowel movement).     losartan (COZAAR) 25 MG tablet TAKE 1 TABLET (25 MG TOTAL) BY MOUTH DAILY. (Patient taking differently: Take 25 mg by mouth every morning.) 90 tablet 1   metFORMIN (GLUCOPHAGE-XR) 500 MG 24 hr tablet TAKE 2 TABLETS BY MOUTH EVERY DAY WITH BREAKFAST 180 tablet 3   Multiple Vitamin (MULTIVITAMIN WITH MINERALS) TABS tablet Take 1 tablet by mouth every morning.     Multiple Vitamins-Minerals (IMMUNE SUPPORT) CHEW Chew 2 tablets by mouth every morning.      norethindrone-ethinyl estradiol (LOESTRIN) 1-20 MG-MCG tablet Take 1 tablet by mouth every morning.     PEPPERMINT OIL PO Take 1 capsule by mouth every morning.     pravastatin (PRAVACHOL) 20 MG tablet Take 1 tablet (20 mg total) by mouth daily. 90 tablet 3   promethazine-dextromethorphan (PROMETHAZINE-DM) 6.25-15 MG/5ML syrup Take 5 mLs by mouth 3 (three) times daily as needed for cough. 140 mL 0   triamterene-hydrochlorothiazide (MAXZIDE-25) 37.5-25 MG tablet Take 1 tablet by mouth every morning.     VITAMIN A PO Take 1 capsule by mouth every morning.     witch hazel-glycerin (TUCKS) pad Apply 1 application topically as needed for hemorrhoids (after each visit to restroom).     No current facility-administered medications on file prior to visit.   Allergies  Allergen Reactions   Augmentin [Amoxicillin-Pot Clavulanate] Nausea And Vomiting    Severe nausea/vomiting    Codeine Other (See Comments)    Makes pain worse   Ciprofloxacin Hcl Rash   Elemental Sulfur Rash   Erythromycin Rash   Sulfa Antibiotics Rash   Social History   Socioeconomic History   Marital status: Married    Spouse name: Not on file   Number of children: 3   Years of education: Not on file   Highest education level: Not on file  Occupational History   Occupation: Set designer: [employer]  Tobacco Use   Smoking status: Never   Smokeless tobacco: Never  Vaping Use   Vaping Use: Never used  Substance and Sexual Activity   Alcohol use: Yes    Comment: rarely   Drug use: No   Sexual activity: Yes  Other Topics Concern   Not on file  Social History Narrative   Married, 2 sons and 1 daughter   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   No caffeine alcohol tobacco or drugs   Social Determinants of Radio broadcast assistant Strain: Not on file  Food Insecurity: Not on file  Transportation Needs: Not on file  Physical Activity: Not on file  Stress: Not on  file  Social Connections: Not on file  Intimate Partner Violence: Not on file      Review of Systems  Constitutional: Negative.   All other systems reviewed and are negative.     Objective:   Physical Exam Vitals reviewed.  Constitutional:      General: She is not in acute distress.    Appearance: Normal appearance. She is well-developed and normal weight. She is not ill-appearing, toxic-appearing or diaphoretic.  HENT:     Head: Normocephalic and atraumatic.     Right Ear:  Tympanic membrane and ear canal normal.     Left Ear: Tympanic membrane and ear canal normal.     Nose: Nose normal. No congestion or rhinorrhea.     Mouth/Throat:     Mouth: Mucous membranes are moist.     Pharynx: Oropharynx is clear. No oropharyngeal exudate or posterior oropharyngeal erythema.  Eyes:     Extraocular Movements: Extraocular movements intact.     Conjunctiva/sclera: Conjunctivae normal.     Pupils: Pupils are equal, round, and reactive to light.  Neck:     Vascular: No carotid bruit.  Cardiovascular:     Rate and Rhythm: Normal rate and regular rhythm.     Heart sounds: Normal heart sounds. No murmur heard.   No friction rub. No gallop.  Pulmonary:     Effort: Pulmonary effort is normal. No respiratory distress.     Breath sounds: Normal breath sounds. No stridor. No wheezing, rhonchi or rales.  Chest:     Chest wall: No tenderness.  Abdominal:     General: Bowel sounds are normal. There is no distension.     Palpations: Abdomen is soft. There is no mass.     Tenderness: There is no abdominal tenderness. There is no guarding or rebound.     Hernia: No hernia is present.  Musculoskeletal:     Cervical back: Normal range of motion and neck supple. No rigidity or tenderness.     Right lower leg: No edema.     Left lower leg: No edema.  Lymphadenopathy:     Cervical: No cervical adenopathy.  Skin:    General: Skin is warm.     Coloration: Skin is not jaundiced or pale.      Findings: No bruising, erythema, lesion or rash.  Neurological:     General: No focal deficit present.     Mental Status: She is alert and oriented to person, place, and time. Mental status is at baseline.     Cranial Nerves: No cranial nerve deficit.     Sensory: No sensory deficit.     Motor: No weakness.     Coordination: Coordination normal.     Gait: Gait normal.     Deep Tendon Reflexes: Reflexes normal.  Psychiatric:        Mood and Affect: Mood normal.        Behavior: Behavior normal.        Thought Content: Thought content normal.        Judgment: Judgment normal.          Assessment & Plan:  Fatigue, unspecified type - Plan: CBC with Differential/Platelet, COMPLETE METABOLIC PANEL WITH GFR, Sedimentation rate, ANA  Other cough I do not believe that she has a compromised immune system due to malignancy based on her symptoms.  She has had a CT scan last year and a chest x-ray in November that was normal.  Question whether we should refer to pulmonology for pulmonary function test or try empiric therapy for cough variant asthma.  However she is also having acid reflux severely.  I suspect that she may also have an element of laryngal esophageal reflux.  I will start by checking a CBC CMP and sed rate along with an ANA to rule out any evidence of autoimmune disease or compromise immune system.  If lab work is normal I recommended starting pantoprazole 40 mg daily and elevating the head of the bed 2 or 3 inches to try to stop the chronic cough.  If this is not helping over the next 2 to 3 weeks to calm the cough, I would recommend pulmonary function test and a trial of an inhaled steroid such as Advair or Symbicort or possibly cough variant asthma

## 2021-09-01 LAB — COMPLETE METABOLIC PANEL WITH GFR
AG Ratio: 1.4 (calc) (ref 1.0–2.5)
ALT: 29 U/L (ref 6–29)
AST: 32 U/L (ref 10–35)
Albumin: 4 g/dL (ref 3.6–5.1)
Alkaline phosphatase (APISO): 62 U/L (ref 31–125)
BUN: 9 mg/dL (ref 7–25)
CO2: 27 mmol/L (ref 20–32)
Calcium: 9.5 mg/dL (ref 8.6–10.2)
Chloride: 101 mmol/L (ref 98–110)
Creat: 0.66 mg/dL (ref 0.50–0.99)
Globulin: 2.9 g/dL (calc) (ref 1.9–3.7)
Glucose, Bld: 200 mg/dL — ABNORMAL HIGH (ref 65–99)
Potassium: 3.9 mmol/L (ref 3.5–5.3)
Sodium: 138 mmol/L (ref 135–146)
Total Bilirubin: 0.3 mg/dL (ref 0.2–1.2)
Total Protein: 6.9 g/dL (ref 6.1–8.1)
eGFR: 109 mL/min/{1.73_m2} (ref >=60)

## 2021-09-01 LAB — CBC WITH DIFFERENTIAL/PLATELET
Absolute Monocytes: 694 cells/uL (ref 200–950)
Basophils Absolute: 53 cells/uL (ref 0–200)
Basophils Relative: 0.6 %
Eosinophils Absolute: 89 cells/uL (ref 15–500)
Eosinophils Relative: 1 %
HCT: 44.4 % (ref 35.0–45.0)
Hemoglobin: 15.3 g/dL (ref 11.7–15.5)
Lymphs Abs: 3738 cells/uL (ref 850–3900)
MCH: 30.4 pg (ref 27.0–33.0)
MCHC: 34.5 g/dL (ref 32.0–36.0)
MCV: 88.1 fL (ref 80.0–100.0)
MPV: 9.8 fL (ref 7.5–12.5)
Monocytes Relative: 7.8 %
Neutro Abs: 4325 cells/uL (ref 1500–7800)
Neutrophils Relative %: 48.6 %
Platelets: 432 10*3/uL — ABNORMAL HIGH (ref 140–400)
RBC: 5.04 10*6/uL (ref 3.80–5.10)
RDW: 11.8 % (ref 11.0–15.0)
Total Lymphocyte: 42 %
WBC: 8.9 10*3/uL (ref 3.8–10.8)

## 2021-09-01 LAB — TEST AUTHORIZATION

## 2021-09-01 LAB — SEDIMENTATION RATE: Sed Rate: 9 mm/h (ref 0–20)

## 2021-09-01 LAB — ANA: Anti Nuclear Antibody (ANA): NEGATIVE

## 2021-09-01 LAB — HEMOGLOBIN A1C W/OUT EAG: Hgb A1c MFr Bld: 6.7 % of total Hgb — ABNORMAL HIGH (ref <5.7)

## 2021-09-24 ENCOUNTER — Encounter: Payer: Self-pay | Admitting: Family Medicine

## 2021-09-24 ENCOUNTER — Other Ambulatory Visit: Payer: Self-pay | Admitting: Family Medicine

## 2021-09-24 MED ORDER — POLYMYXIN B-TRIMETHOPRIM 10000-0.1 UNIT/ML-% OP SOLN: 2.0000 [drp] | OPHTHALMIC | 0 refills | Status: DC

## 2021-10-02 ENCOUNTER — Other Ambulatory Visit: Payer: Self-pay | Admitting: Family Medicine

## 2021-10-15 ENCOUNTER — Other Ambulatory Visit: Payer: Self-pay | Admitting: Family Medicine

## 2021-10-15 ENCOUNTER — Encounter: Payer: Self-pay | Admitting: Family Medicine

## 2021-10-15 MED ORDER — FLUCONAZOLE 150 MG PO TABS: 150.0000 mg | ORAL_TABLET | Freq: Once | ORAL | 0 refills | Status: AC

## 2021-10-24 ENCOUNTER — Encounter: Payer: Self-pay | Admitting: Family Medicine

## 2021-10-25 ENCOUNTER — Other Ambulatory Visit: Payer: Self-pay

## 2021-10-25 ENCOUNTER — Ambulatory Visit: Payer: BC Managed Care – PPO | Admitting: Family Medicine

## 2021-10-25 ENCOUNTER — Telehealth: Payer: Self-pay

## 2021-10-25 VITALS — BP 120/82 | HR 103 | Temp 97.2°F | Ht 64.0 in | Wt 203.6 lb

## 2021-10-25 DIAGNOSIS — N3000 Acute cystitis without hematuria: Secondary | ICD-10-CM

## 2021-10-25 LAB — URINALYSIS, ROUTINE W REFLEX MICROSCOPIC
Bilirubin Urine: NEGATIVE
Hyaline Cast: NONE SEEN /LPF
Nitrite: POSITIVE — AB
Specific Gravity, Urine: 1.02 (ref 1.001–1.035)
pH: 6.5 (ref 5.0–8.0)

## 2021-10-25 LAB — MICROSCOPIC MESSAGE

## 2021-10-25 MED ORDER — CEPHALEXIN 500 MG PO CAPS: 500.0000 mg | ORAL_CAPSULE | Freq: Three times a day (TID) | ORAL | 0 refills | Status: DC

## 2021-10-25 NOTE — Progress Notes (Signed)
? ?Subjective:  ? ? Patient ID: Candice Jones, female    DOB: [birth year], 46 y.o.   MRN: [id number] ? ?Urinary Tract Infection  ?Over the weekend, the patient developed frequency, urgency, dysuria.  By Sunday she was unable to go to church because she felt like she had to constantly go to the bathroom.  It continues to hurt and burn so she started taking Azo yesterday.  As result her urinalysis today shows orange urine that is inaccurate but trace blood, +1 protein, positive nitrates, +1 leukocyte esterase.  Microscopic analysis shows 10-20 white blood cells 0-2 red blood cells few bacteria otherwise normal. ? ? ?Past Medical History:  ?Diagnosis Date  ? Adenomyosis   ? Asthma   ? COVID-19   ? Diabetes ([place])   ? Gall stones   ? Gestational diabetes mellitus   ? Hyperlipidemia   ? IBS (irritable bowel syndrome)   ? Internal hemorrhoids   ? Kidney stones   ? Meniere's syndrome   ? Vertigo   ? ?Past Surgical History:  ?Procedure Laterality Date  ? CESAREAN SECTION  08/1999, 05/2003  ? CHOLECYSTECTOMY  2011  ? CYSTECTOMY  1997  ? left wrist   ? ENDOMETRIAL ABLATION  01/2010  ? HEMORRHOID BANDING  2015  ? MINOR HEMORRHOIDECTOMY  04/2014  ? thrombosed  ? [place] EXTRACTION  1996/1997  ? ?Current Outpatient Medications on File Prior to Visit  ?Medication Sig Dispense Refill  ? acetaminophen (TYLENOL) 500 MG tablet Take 1,000 mg by mouth every 6 (six) hours as needed (pain).    ? albuterol (VENTOLIN HFA) 108 (90 Base) MCG/ACT inhaler Inhale 2 puffs into the lungs every 4 (four) hours as needed for wheezing or shortness of breath. 18 g 0  ? Ascorbic Acid (VITAMIN C PO) Take 1 tablet by mouth every morning.    ? Azelastine HCl 137 MCG/SPRAY SOLN Place 2 sprays into both nostrils every morning.    ? Blood Glucose Monitoring Suppl KIT Use to check fasting glucose daily ICD10: R73.01.Dispense based on insurance preference 1 kit 0  ? Cholecalciferol (VITAMIN D3 PO) Take 1 tablet by mouth every morning.    ? Cranberry-Vitamin  C-Vitamin E (CRANBERRY PLUS VITAMIN C) 4200-20-3 MG-MG-UNIT CAPS Take 2 capsules by mouth every morning.    ? docusate sodium (COLACE) 100 MG capsule Take 100 mg by mouth every morning.    ? fluconazole (DIFLUCAN) 150 MG tablet Take 150 mg by mouth once.    ? fluticasone (FLONASE) 50 MCG/ACT nasal spray SPRAY 2 SPRAYS INTO EACH NOSTRIL EVERY DAY (Patient taking differently: Place 2 sprays into both nostrils daily as needed for allergies or rhinitis.) 48 mL 1  ? glucose blood test strip Use with glucose meter to check blood sugar.ICD10: R73.01.Dispense based on insurance preference 100 each 12  ? Lancets (FREESTYLE) lancets Use to check blood sugar ICD10: R73.01.Dispense based on insurance preference 100 each 12  ? levocetirizine (XYZAL) 5 MG tablet Take 5 mg by mouth every morning.    ? Lido-PE-Glycerin-Petrolatum (PREPARATION H RAPID RELIEF) 5-0.25-14.4-15 % CREA Place 1 application rectally as needed (after each bowel movement).    ? losartan (COZAAR) 25 MG tablet TAKE 1 TABLET (25 MG TOTAL) BY MOUTH DAILY. 90 tablet 1  ? metFORMIN (GLUCOPHAGE-XR) 500 MG 24 hr tablet TAKE 2 TABLETS BY MOUTH EVERY DAY WITH BREAKFAST 180 tablet 3  ? Multiple Vitamin (MULTIVITAMIN WITH MINERALS) TABS tablet Take 1 tablet by mouth every morning.    ? Multiple  Vitamins-Minerals (IMMUNE SUPPORT) CHEW Chew 2 tablets by mouth every morning.    ? norethindrone-ethinyl estradiol (LOESTRIN) 1-20 MG-MCG tablet Take 1 tablet by mouth every morning.    ? pantoprazole (PROTONIX) 40 MG tablet Take 1 tablet (40 mg total) by mouth daily. 30 tablet 3  ? PEPPERMINT OIL PO Take 1 capsule by mouth every morning.    ? pravastatin (PRAVACHOL) 20 MG tablet Take 1 tablet (20 mg total) by mouth daily. 90 tablet 3  ? promethazine-dextromethorphan (PROMETHAZINE-DM) 6.25-15 MG/5ML syrup Take 5 mLs by mouth 3 (three) times daily as needed for cough. 140 mL 0  ? triamterene-hydrochlorothiazide (MAXZIDE-25) 37.5-25 MG tablet Take 1 tablet by mouth every morning.     ? trimethoprim-polymyxin b (POLYTRIM) ophthalmic solution Place 2 drops into the left eye every 4 (four) hours. 10 mL 0  ? VITAMIN A PO Take 1 capsule by mouth every morning.    ? witch hazel-glycerin (TUCKS) pad Apply 1 application topically as needed for hemorrhoids (after each visit to restroom).    ? ?No current facility-administered medications on file prior to visit.  ? ?Allergies  ?Allergen Reactions  ? Augmentin [Amoxicillin-Pot Clavulanate] Nausea And Vomiting  ?  Severe nausea/vomiting ?  ? Codeine Other (See Comments)  ?  Makes pain worse  ? Ciprofloxacin Hcl Rash  ? Elemental Sulfur Rash  ? Erythromycin Rash  ? Sulfa Antibiotics Rash  ? ?Social History  ? ?Socioeconomic History  ? Marital status: Married  ?  Spouse name: Not on file  ? Number of children: 3  ? Years of education: Not on file  ? Highest education level: Not on file  ?Occupational History  ? Occupation: Environmental consultant  ?  Employer: [employer]  ?Tobacco Use  ? Smoking status: Never  ? Smokeless tobacco: Never  ?Vaping Use  ? Vaping Use: Never used  ?Substance and Sexual Activity  ? Alcohol use: Yes  ?  Comment: rarely  ? Drug use: No  ? Sexual activity: Yes  ?Other Topics Concern  ? Not on file  ?Social History Narrative  ? Married, 2 sons and 1 daughter  ? 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  ? No caffeine alcohol tobacco or drugs  ? ?Social Determinants of Health  ? ?Financial Resource Strain: Not on file  ?Food Insecurity: Not on file  ?Transportation Needs: Not on file  ?Physical Activity: Not on file  ?Stress: Not on file  ?Social Connections: Not on file  ?Intimate Partner Violence: Not on file  ? ? ? ? ?Review of Systems  ?Constitutional: Negative.   ?All other systems reviewed and are negative. ? ?   ?Objective:  ? Physical Exam ?Vitals reviewed.  ?Constitutional:   ?   General: She is not in acute distress. ?   Appearance: Normal appearance. She is well-developed and normal weight. She is not  ill-appearing, toxic-appearing or diaphoretic.  ?HENT:  ?   Head: Normocephalic and atraumatic.  ?   Right Ear: Tympanic membrane and ear canal normal.  ?   Left Ear: Tympanic membrane and ear canal normal.  ?   Nose: Nose normal. No congestion or rhinorrhea.  ?   Mouth/Throat:  ?   Mouth: Mucous membranes are moist.  ?   Pharynx: Oropharynx is clear. No oropharyngeal exudate or posterior oropharyngeal erythema.  ?Eyes:  ?   Extraocular Movements: Extraocular movements intact.  ?   Conjunctiva/sclera: Conjunctivae normal.  ?   Pupils: Pupils are equal,  round, and reactive to light.  ?Neck:  ?   Vascular: No carotid bruit.  ?Cardiovascular:  ?   Rate and Rhythm: Normal rate and regular rhythm.  ?   Heart sounds: Normal heart sounds. No murmur heard. ?  No friction rub. No gallop.  ?Pulmonary:  ?   Effort: Pulmonary effort is normal. No respiratory distress.  ?   Breath sounds: Normal breath sounds. No stridor. No wheezing, rhonchi or rales.  ?Chest:  ?   Chest wall: No tenderness.  ?Abdominal:  ?   General: Bowel sounds are normal. There is no distension.  ?   Palpations: Abdomen is soft. There is no mass.  ?   Tenderness: There is no abdominal tenderness. There is no guarding or rebound.  ?   Hernia: No hernia is present.  ?Musculoskeletal:  ?   Cervical back: Normal range of motion and neck supple. No rigidity or tenderness.  ?   Right lower leg: No edema.  ?   Left lower leg: No edema.  ?Lymphadenopathy:  ?   Cervical: No cervical adenopathy.  ?Skin: ?   General: Skin is warm.  ?   Coloration: Skin is not jaundiced or pale.  ?   Findings: No bruising, erythema, lesion or rash.  ?Neurological:  ?   General: No focal deficit present.  ?   Mental Status: She is alert and oriented to person, place, and time. Mental status is at baseline.  ?   Cranial Nerves: No cranial nerve deficit.  ?   Sensory: No sensory deficit.  ?   Motor: No weakness.  ?   Coordination: Coordination normal.  ?   Gait: Gait normal.  ?   Deep  Tendon Reflexes: Reflexes normal.  ?Psychiatric:     ?   Mood and Affect: Mood normal.     ?   Behavior: Behavior normal.     ?   Thought Content: Thought content normal.     ?   Judgment: Judgment normal.  ?

## 2021-10-25 NOTE — Addendum Note (Signed)
Addended by: Shelby Dubin on: 10/25/2021 04:54 PM ? ? Modules accepted: Orders ? ?

## 2021-10-26 NOTE — Telephone Encounter (Signed)
Disregard please see Mychart msg from 10/24/21 ?

## 2021-10-28 ENCOUNTER — Other Ambulatory Visit: Payer: Self-pay | Admitting: Family Medicine

## 2021-10-28 MED ORDER — FLUCONAZOLE 150 MG PO TABS: 150.0000 mg | ORAL_TABLET | Freq: Once | ORAL | 0 refills | Status: AC

## 2021-12-14 ENCOUNTER — Encounter: Payer: Self-pay | Admitting: Family Medicine

## 2021-12-23 ENCOUNTER — Other Ambulatory Visit: Payer: Self-pay | Admitting: Family Medicine

## 2021-12-24 ENCOUNTER — Encounter: Payer: Self-pay | Admitting: Family Medicine

## 2021-12-24 ENCOUNTER — Other Ambulatory Visit: Payer: Self-pay | Admitting: Family Medicine

## 2021-12-24 MED ORDER — PANTOPRAZOLE SODIUM 40 MG PO TBEC: 40.0000 mg | DELAYED_RELEASE_TABLET | Freq: Two times a day (BID) | ORAL | 3 refills | Status: DC

## 2021-12-27 NOTE — Telephone Encounter (Signed)
Rx 12/24/21 #[id number] duplicate request Requested Prescriptions  Pending Prescriptions Disp Refills  . pantoprazole (PROTONIX) 40 MG tablet [Pharmacy Med Name: PANTOPRAZOLE SOD DR 40 MG TAB] 90 tablet 1    Sig: TAKE 1 TABLET BY MOUTH EVERY DAY     Gastroenterology: Proton Pump Inhibitors Passed - 12/23/2021  1:32 PM      Passed - Valid encounter within last 12 months    Recent Outpatient Visits          2 months ago Acute cystitis without hematuria   [facility] Donita Brooks, MD   4 months ago Fatigue, unspecified type   [facility] Donita Brooks, MD   1 year ago Acute midline low back pain with left-sided sciatica   [facility] Valentino Nose, NP   1 year ago Controlled type 2 diabetes mellitus without complication, without long-term current use of insulin (HCC)   [facility] Pickard, Priscille Heidelberg, MD   1 year ago COVID-19   [facility] Pickard, Priscille Heidelberg, MD

## 2022-01-07 ENCOUNTER — Encounter: Payer: Self-pay | Admitting: *Deleted

## 2022-01-13 NOTE — Progress Notes (Signed)
01/14/2022 Candice Jones [id number] [birth year]  Referring provider: Susy Frizzle, MD Primary GI doctor: Dr. Carlean Purl  ASSESSMENT AND PLAN:   Family history of colon cancer - sister < 60 01/23/2017 colonoscopy no specimens collected recall 5 years due to family history (12/2022)  Gastroesophageal reflux disease, unspecified whether esophagitis present Continue PPI BID, will schedule for EGD Lifestyle changes discussed, avoid NSAIDS, ETOH I discussed risks of EGD with patient today, including risk of sedation, bleeding or perforation.  Patient provides understanding and gave verbal consent to proceed. Could be bile acid reflux with cholecystectomy and description, could consider carafate  Internal hemorrhoids with bleeding and prolapse Add on fiber, continue stool softener.  No bleeding or issues at this time If any continuing bleeding would suggest follow up OV with Dr. Carlean Purl to evaluate for possible banding.     History of Present Illness:  46 y.o. female  with a past medical history of Demetrio cyst, IBS D, internal hemorrhoids with history of banding, family history of colon cancer (sister age 81), status post cholecystectomy and others listed below, returns to clinic today for evaluation of GERD.  11/29/2016 office visit Dr. Carlean Purl 01/23/2017 colonoscopy no specimens collected recall 5 years due to family history (12/2022) 10/12/2020 office visit for possible banding, anoscopy showed right anterior external residual thrombosed hemorrhoid grade 1 internal/external hemorrhoids otherwise.  Patient has been to UC x 2 for hemorrhoids.  Add on stool softener that has helped.   6-8 months had post COVID cough. She has vomiting acid in the night.  PCP added on PPI and symptoms improved She then ate [employer] at 6 pm, then 11 pm that night she was refluxing into her mouth and nose. She was having chest pain, coughing, choking and burning. She vomited several times with green bile.  No blood.  Had ENT appointment with laryngeal scope, had erythema.  Increased protonix to twice a day on May 25th with improvement of her symptoms. Intermittent gagging with pills and has to chew very small but denies dysphagia.  She denies melena, AB pain.  She  denies AB bloating.  No unintentional weight loss, no night sweats. She denies NSAID use.  She denies ETOH use.     Current Medications:   Current Outpatient Medications (Endocrine & Metabolic):    metFORMIN (GLUCOPHAGE-XR) 500 MG 24 hr tablet, TAKE 2 TABLETS BY MOUTH EVERY DAY WITH BREAKFAST   norethindrone-ethinyl estradiol (LOESTRIN) 1-20 MG-MCG tablet, Take 1 tablet by mouth every morning.  Current Outpatient Medications (Cardiovascular):    losartan (COZAAR) 25 MG tablet, TAKE 1 TABLET (25 MG TOTAL) BY MOUTH DAILY.   pravastatin (PRAVACHOL) 20 MG tablet, Take 1 tablet (20 mg total) by mouth daily.   triamterene-hydrochlorothiazide (MAXZIDE-25) 37.5-25 MG tablet, Take 1 tablet by mouth every morning.  Current Outpatient Medications (Respiratory):    albuterol (VENTOLIN HFA) 108 (90 Base) MCG/ACT inhaler, Inhale 2 puffs into the lungs every 4 (four) hours as needed for wheezing or shortness of breath.   Azelastine HCl 137 MCG/SPRAY SOLN, Place 2 sprays into both nostrils every morning.   fluticasone (FLONASE) 50 MCG/ACT nasal spray, SPRAY 2 SPRAYS INTO EACH NOSTRIL EVERY DAY (Patient taking differently: Place 2 sprays into both nostrils daily as needed for allergies or rhinitis.)   levocetirizine (XYZAL) 5 MG tablet, Take 5 mg by mouth every morning.  Current Outpatient Medications (Analgesics):    acetaminophen (TYLENOL) 500 MG tablet, Take 1,000 mg by mouth every 6 (six) hours as needed (pain).  Current Outpatient Medications (Other):    Ascorbic Acid (VITAMIN C PO), Take 1 tablet by mouth every morning.   Blood Glucose Monitoring Suppl KIT, Use to check fasting glucose daily ICD10: R73.01.Dispense based on insurance  preference   Cholecalciferol (VITAMIN D3 PO), Take 1 tablet by mouth every morning.   Cranberry-Vitamin C-Vitamin E (CRANBERRY PLUS VITAMIN C) 4200-20-3 MG-MG-UNIT CAPS, Take 2 capsules by mouth every morning.   docusate sodium (COLACE) 100 MG capsule, Take 100 mg by mouth every morning.   glucose blood test strip, Use with glucose meter to check blood sugar.ICD10: R73.01.Dispense based on insurance preference   Lancets (FREESTYLE) lancets, Use to check blood sugar ICD10: R73.01.Dispense based on insurance preference   Multiple Vitamin (MULTIVITAMIN WITH MINERALS) TABS tablet, Take 1 tablet by mouth every morning.   Multiple Vitamins-Minerals (IMMUNE SUPPORT) CHEW, Chew 2 tablets by mouth every morning.   pantoprazole (PROTONIX) 40 MG tablet, Take 1 tablet (40 mg total) by mouth 2 (two) times daily.   PEPPERMINT OIL PO, Take 1 capsule by mouth every morning.   VITAMIN A PO, Take 1 capsule by mouth every morning.  Surgical History:  She  has a past surgical history that includes Endometrial ablation (01/2010); Cesarean section (08/1999, 05/2003); Cystectomy (1997); Wisdom tooth extraction (1996/1997); Cholecystectomy (2011); Minor hemorrhoidectomy (04/2014); and Hemorrhoid banding (2015). Family History:  Her family history includes Cancer in her maternal grandfather; Colon cancer in her sister; Diabetes in her father, maternal grandmother, and paternal grandmother; Diverticulitis in her sister; Heart disease in her maternal grandfather, maternal grandmother, and mother; High Cholesterol in her father, maternal grandfather, maternal grandmother, and mother; Hypertension in her maternal grandmother and mother; Kidney cancer in her maternal grandmother; Lupus in her sister; Prostate cancer in her paternal grandfather. Social History:   reports that she has never smoked. She has never used smokeless tobacco. She reports current alcohol use. She reports that she does not use drugs.  Current Medications,  Allergies, Past Medical History, Past Surgical History, Family History and Social History were reviewed in [employer] record.  Physical Exam: BP 138/82   Pulse 96   Ht 5' 4.5" (1.638 m) Comment: height measured without shoes  Wt 205 lb (93 kg)   BMI 34.64 kg/m  General:   Pleasant, well developed female in no acute distress Heart : Regular rate and rhythm; no murmurs Pulm: Clear anteriorly; no wheezing Abdomen:  Soft, Obese AB, Active bowel sounds. No tenderness . , No organomegaly appreciated. Rectal: Not evaluated Extremities:  without  edema. Neurologic:  Alert and  oriented x4;  No focal deficits.  Psych:  Cooperative. Normal mood and affect.   Vladimir Crofts, PA-C 01/14/22

## 2022-01-14 ENCOUNTER — Ambulatory Visit: Payer: BC Managed Care – PPO | Admitting: Physician Assistant

## 2022-01-14 ENCOUNTER — Encounter: Payer: Self-pay | Admitting: Physician Assistant

## 2022-01-14 ENCOUNTER — Encounter: Payer: Self-pay | Admitting: Family Medicine

## 2022-01-14 VITALS — BP 138/82 | HR 96 | Ht 64.5 in | Wt 205.0 lb

## 2022-01-14 DIAGNOSIS — K219 Gastro-esophageal reflux disease without esophagitis: Secondary | ICD-10-CM | POA: Diagnosis not present

## 2022-01-14 DIAGNOSIS — Z800 Family history of malignant neoplasm of digestive organs: Secondary | ICD-10-CM

## 2022-01-14 DIAGNOSIS — K648 Other hemorrhoids: Secondary | ICD-10-CM

## 2022-01-14 NOTE — Patient Instructions (Addendum)
You have been scheduled for an endoscopy. Please follow written instructions given to you at your visit today. If you use inhalers (even only as needed), please bring them with you on the day of your procedure.   FIBER SUPPLEMENT You can do metamucil or fibercon once or twice a day but if this causes gas/bloating please switch to Benefiber or Citracel.  Fiber is good for constipation/diarrhea/irritable bowel syndrome.  It can also help with weight loss and can help lower your bad cholesterol.  Please do 1 TBSP in the morning in water, coffee, or tea. It can take up to a month before you can see a difference with your bowel movements.  It is cheapest from [employer], [employer], [employer].   Please do sitz baths, increase fiber or add benefiber, increase water and increase acitivity.  Get squatty potty or your knees above your hips while pooping  Symptoms of Hemorrhoids  Some symptoms of hemorrhoids include: Swelling and/or a tender lump around the anus Itching, mild burning and bleeding around the anus Painful bowel movements with or without constipation Bright red blood covering the stool, on toilet paper or in the toilet bowel.   Symptoms usually go away within a few days.  Always talk to your doctor about any bleeding to make sure it is not from some other causes.  Diagnosing and Treating Hemorrhoids  Diagnosis is made by an examination by your healthcare provider.  Special test can be performed by your doctor.    Most cases of hemorrhoids can be treated with: High-fiber diet: Eat more high-fiber foods, which help prevent constipation.  Ask for more detailed fiber information on types and sources of fiber from your healthcare provider. Fluids: Drink plenty of water.  This helps soften bowel movements so they are easier to pass. Sitz baths and cold packs: Sitting in lukewarm water two or three times a day for 15 minutes cleases the anal area and may relieve discomfort.  If the water is too hot,  swelling around the anus will get worse.  Placing a cloth-covered ice pack on the anus for ten minutes four times a day can also help reduce selling.  Gently pushing a prolapsed hemorrhoid back inside after the bath or ice pack can be helpful. Medications: For mild discomfort, your healthcare provider may suggest over-the-counter pain medication or prescribe a cream or ointment for topical use.  The cream may contain witch hazel, zinc oxide or petroleum jelly.  Medicated suppositories are also a treatment option.  Always consult your doctor before applying medications or creams. Procedures and surgeries: There are also a number of procedures and surgeries to shrink or remove hemorrhoids in more serious cases.  Talk to your physician about these options.  You can often prevent hemorrhoids or keep them from becoming worse by maintaining a healthy lifestyle.  Eat a fiber-rich diet of fruits, vegetables and whole grains.  Also, drink plenty of water and exercise regularly.   Avoid spicy and acidic foods Avoid fatty foods Limit your intake of coffee, tea, alcohol, and carbonated drinks Work to maintain a healthy weight Keep the head of the bed elevated at least 3 inches with blocks or a wedge pillow if you are having any nighttime symptoms Stay upright for 2 hours after eating Avoid meals and snacks three to four hours before bedtime

## 2022-01-19 LAB — HM DIABETES EYE EXAM

## 2022-02-11 ENCOUNTER — Other Ambulatory Visit: Payer: Self-pay | Admitting: Family Medicine

## 2022-02-13 ENCOUNTER — Encounter: Payer: Self-pay | Admitting: Certified Registered Nurse Anesthetist

## 2022-02-16 ENCOUNTER — Encounter: Payer: Self-pay | Admitting: Internal Medicine

## 2022-02-16 ENCOUNTER — Ambulatory Visit (AMBULATORY_SURGERY_CENTER): Payer: BC Managed Care – PPO | Admitting: Internal Medicine

## 2022-02-16 VITALS — BP 123/77 | HR 76 | Temp 97.3°F | Resp 11 | Ht 64.0 in | Wt 206.0 lb

## 2022-02-16 DIAGNOSIS — K219 Gastro-esophageal reflux disease without esophagitis: Secondary | ICD-10-CM

## 2022-02-16 DIAGNOSIS — K319 Disease of stomach and duodenum, unspecified: Secondary | ICD-10-CM

## 2022-02-16 DIAGNOSIS — R12 Heartburn: Secondary | ICD-10-CM

## 2022-02-16 DIAGNOSIS — K31819 Angiodysplasia of stomach and duodenum without bleeding: Secondary | ICD-10-CM | POA: Diagnosis not present

## 2022-02-16 DIAGNOSIS — K449 Diaphragmatic hernia without obstruction or gangrene: Secondary | ICD-10-CM | POA: Diagnosis not present

## 2022-02-16 NOTE — Progress Notes (Signed)
[facility] History and Physical   Primary Care Physician:  Susy Frizzle, MD   Reason for Procedure:   GERD despite PPI Tx  Plan:    EGD     HPI: Candice Jones is a 46 y.o. female w/ reflux sxs despite PPI use. Some cough - post Covid, has been to ENT - laryngeal edema. Episodes of reflux into mouth and nose.On bid PPI   Past Medical History:  Diagnosis Date   Adenomyosis    Asthma    COVID-19    Diabetes ([place])    Gall stones    Gestational diabetes mellitus    Hiatal hernia    Hyperlipidemia    IBS (irritable bowel syndrome)    Internal hemorrhoids    Kidney stones    Meniere's syndrome    Vertigo     Past Surgical History:  Procedure Laterality Date   CESAREAN SECTION  08/1999, 05/2003   CHOLECYSTECTOMY  2011   CYSTECTOMY  1997   left wrist    ENDOMETRIAL ABLATION  01/2010   HEMORRHOID BANDING  2015   MINOR HEMORRHOIDECTOMY  04/2014   thrombosed   WISDOM TOOTH EXTRACTION  1996/1997    Prior to Admission medications   Medication Sig Start Date End Date Taking? Authorizing Provider  Ascorbic Acid (VITAMIN C PO) Take 1 tablet by mouth every morning.   Yes [provider]  Azelastine HCl 137 MCG/SPRAY SOLN Place 2 sprays into both nostrils every morning. 04/13/21  Yes [provider]  Blood Glucose Monitoring Suppl KIT Use to check fasting glucose daily ICD10: R73.01.Dispense based on insurance preference 07/30/19  Yes Susy Frizzle, MD  Cholecalciferol (VITAMIN D3 PO) Take 1 tablet by mouth every morning.   Yes [provider]  Cranberry-Vitamin C-Vitamin E (CRANBERRY PLUS VITAMIN C) 4200-20-3 MG-MG-UNIT CAPS Take 2 capsules by mouth every morning.   Yes [provider]  docusate sodium (COLACE) 100 MG capsule Take 100 mg by mouth every morning.   Yes [provider]  fluticasone (FLONASE) 50 MCG/ACT nasal spray SPRAY 2 SPRAYS INTO EACH NOSTRIL EVERY DAY Patient taking differently: Place 2 sprays into  both nostrils daily as needed for allergies or rhinitis. 03/19/20  Yes Bates, Crystal A, FNP  glucose blood test strip Use with glucose meter to check blood sugar.ICD10: R73.01.Dispense based on insurance preference 02/09/18  Yes Susy Frizzle, MD  Lancets (FREESTYLE) lancets Use to check blood sugar ICD10: R73.01.Dispense based on insurance preference 02/09/18  Yes Pickard, Cammie Mcgee, MD  levocetirizine (XYZAL) 5 MG tablet Take 5 mg by mouth every morning.   Yes [provider]  losartan (COZAAR) 25 MG tablet TAKE 1 TABLET (25 MG TOTAL) BY MOUTH DAILY. 10/04/21  Yes Susy Frizzle, MD  metFORMIN (GLUCOPHAGE-XR) 500 MG 24 hr tablet TAKE 2 TABLETS BY MOUTH EVERY DAY WITH BREAKFAST 08/09/21  Yes Susy Frizzle, MD  Multiple Vitamin (MULTIVITAMIN WITH MINERALS) TABS tablet Take 1 tablet by mouth every morning.   Yes [provider]  Multiple Vitamins-Minerals (IMMUNE SUPPORT) CHEW Chew 2 tablets by mouth every morning.   Yes [provider]  norethindrone-ethinyl estradiol (LOESTRIN) 1-20 MG-MCG tablet Take 1 tablet by mouth every morning.   Yes [provider]  pantoprazole (PROTONIX) 40 MG tablet Take 1 tablet (40 mg total) by mouth 2 (two) times daily. 12/24/21  Yes Susy Frizzle, MD  pravastatin (PRAVACHOL) 20 MG tablet Take 1 tablet (20 mg total) by mouth daily. 08/10/21  Yes Susy Frizzle, MD  triamterene-hydrochlorothiazide (MAXZIDE-25) 37.5-25 MG tablet Take 1 tablet by mouth every morning. 04/02/21  Yes [provider]  VITAMIN A PO Take 1 capsule by mouth every morning.   Yes [provider]  albuterol (VENTOLIN HFA) 108 (90 Base) MCG/ACT inhaler Inhale 2 puffs into the lungs every 4 (four) hours as needed for wheezing or shortness of breath. 02/11/20   Ishmael Holter A, FNP  PEPPERMINT OIL PO Take 1 capsule by mouth every morning.    [provider]    Current Outpatient Medications  Medication Sig Dispense Refill    Ascorbic Acid (VITAMIN C PO) Take 1 tablet by mouth every morning.     Azelastine HCl 137 MCG/SPRAY SOLN Place 2 sprays into both nostrils every morning.     Blood Glucose Monitoring Suppl KIT Use to check fasting glucose daily ICD10: R73.01.Dispense based on insurance preference 1 kit 0   Cholecalciferol (VITAMIN D3 PO) Take 1 tablet by mouth every morning.     Cranberry-Vitamin C-Vitamin E (CRANBERRY PLUS VITAMIN C) 4200-20-3 MG-MG-UNIT CAPS Take 2 capsules by mouth every morning.     docusate sodium (COLACE) 100 MG capsule Take 100 mg by mouth every morning.     fluticasone (FLONASE) 50 MCG/ACT nasal spray SPRAY 2 SPRAYS INTO EACH NOSTRIL EVERY DAY (Patient taking differently: Place 2 sprays into both nostrils daily as needed for allergies or rhinitis.) 48 mL 1   glucose blood test strip Use with glucose meter to check blood sugar.ICD10: R73.01.Dispense based on insurance preference 100 each 12   Lancets (FREESTYLE) lancets Use to check blood sugar ICD10: R73.01.Dispense based on insurance preference 100 each 12   levocetirizine (XYZAL) 5 MG tablet Take 5 mg by mouth every morning.     losartan (COZAAR) 25 MG tablet TAKE 1 TABLET (25 MG TOTAL) BY MOUTH DAILY. 90 tablet 1   metFORMIN (GLUCOPHAGE-XR) 500 MG 24 hr tablet TAKE 2 TABLETS BY MOUTH EVERY DAY WITH BREAKFAST 180 tablet 3   Multiple Vitamin (MULTIVITAMIN WITH MINERALS) TABS tablet Take 1 tablet by mouth every morning.     Multiple Vitamins-Minerals (IMMUNE SUPPORT) CHEW Chew 2 tablets by mouth every morning.     norethindrone-ethinyl estradiol (LOESTRIN) 1-20 MG-MCG tablet Take 1 tablet by mouth every morning.     pantoprazole (PROTONIX) 40 MG tablet Take 1 tablet (40 mg total) by mouth 2 (two) times daily. 60 tablet 3   pravastatin (PRAVACHOL) 20 MG tablet Take 1 tablet (20 mg total) by mouth daily. 90 tablet 3   triamterene-hydrochlorothiazide (MAXZIDE-25) 37.5-25 MG tablet Take 1 tablet by mouth every morning.     VITAMIN A PO Take 1  capsule by mouth every morning.     albuterol (VENTOLIN HFA) 108 (90 Base) MCG/ACT inhaler Inhale 2 puffs into the lungs every 4 (four) hours as needed for wheezing or shortness of breath. 18 g 0   PEPPERMINT OIL PO Take 1 capsule by mouth every morning.     No current facility-administered medications for this visit.    Allergies as of 02/16/2022 - Review Complete 02/16/2022  Allergen Reaction Noted   Augmentin [amoxicillin-pot clavulanate] Nausea And Vomiting 05/12/2011   Codeine Other (See Comments) 01/22/2014   Ciprofloxacin hcl Rash 06/12/2011   Elemental sulfur Rash 05/12/2011   Erythromycin Rash 05/12/2011   Sulfa antibiotics Rash 06/05/2021    Family History  Problem Relation Age of Onset   High Cholesterol Mother    Hypertension Mother    Heart  disease Mother    Diabetes Father    High Cholesterol Father    Colon cancer Sister        negative for Lynch syndrome   Diverticulitis Sister    Diabetes Maternal Grandmother    Heart disease Maternal Grandmother    Hypertension Maternal Grandmother    High Cholesterol Maternal Grandmother    Kidney cancer Maternal Grandmother    Heart disease Maternal Grandfather    High Cholesterol Maternal Grandfather    Cancer Maternal Grandfather    Diabetes Paternal Grandmother    Prostate cancer Paternal Grandfather    Lupus Sister     Social History   Socioeconomic History   Marital status: Married    Spouse name: Not on file   Number of children: 3   Years of education: Not on file   Highest education level: Not on file  Occupational History   Occupation: Set designer: [employer]  Tobacco Use   Smoking status: Never   Smokeless tobacco: Never  Vaping Use   Vaping Use: Never used  Substance and Sexual Activity   Alcohol use: Yes    Comment: rarely   Drug use: No   Sexual activity: Yes  Other Topics Concern   Not on file  Social History Narrative   Married, 2 sons and 1 daughter    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   No caffeine alcohol tobacco or drugs   Social Determinants of Radio broadcast assistant Strain: Not on file  Food Insecurity: Not on file  Transportation Needs: Not on file  Physical Activity: Not on file  Stress: Not on file  Social Connections: Not on file  Intimate Partner Violence: Not on file    Review of Systems:  All other review of systems negative except as mentioned in the HPI.  Physical Exam: Vital signs BP 130/77   Pulse 97   Temp (!) 97.3 F (36.3 C)   Ht 5' 4"  (1.626 m)   Wt 206 lb (93.4 kg)   SpO2 99%   BMI 35.36 kg/m   General:   Alert,  Well-developed, well-nourished, pleasant and cooperative in NAD Lungs:  Clear throughout to auscultation.   Heart:  Regular rate and rhythm; no murmurs, clicks, rubs,  or gallops. Abdomen:  Soft, nontender and nondistended. Normal bowel sounds.   Neuro/Psych:  Alert and cooperative. Normal mood and affect. A and O x 3   @Carey Johndrow  Simonne Maffucci, MD, [facility] [phone] (pager) 02/16/2022 1:59 PM@

## 2022-02-16 NOTE — Progress Notes (Signed)
Called to room to assist during endoscopic procedure.  Patient ID and intended procedure confirmed with present staff. Received instructions for my participation in the procedure from the performing physician.  

## 2022-02-16 NOTE — Patient Instructions (Addendum)
Main findings: Small hiatal hernia Red stomach (erythematous) - biopsies taken. Sometimes an infection causes this but often is not a problem.  I will let you know results and recommendations. Continue current theapy. Read and follow GERD diet  GERD DIET. HANDOUT ON GERD DIET GIVEN.   YOU HAD AN ENDOSCOPIC PROCEDURE TODAY AT THE [facility]:   Refer to the procedure report that was given to you for any specific questions about what was found during the examination.  If the procedure report does not answer your questions, please call your gastroenterologist to clarify.  If you requested that your care partner not be given the details of your procedure findings, then the procedure report has been included in a sealed envelope for you to review at your convenience later.  YOU SHOULD EXPECT: Some feelings of bloating in the abdomen. Passage of more gas than usual.  Walking can help get rid of the air that was put into your GI tract during the procedure and reduce the bloating. If you had a lower endoscopy (such as a colonoscopy or flexible sigmoidoscopy) you may notice spotting of blood in your stool or on the toilet paper. If you underwent a bowel prep for your procedure, you may not have a normal bowel movement for a few days.  Please Note:  You might notice some irritation and congestion in your nose or some drainage.  This is from the oxygen used during your procedure.  There is no need for concern and it should clear up in a day or so.  SYMPTOMS TO REPORT IMMEDIATELY:   Following upper endoscopy (EGD)  Vomiting of blood or coffee ground material  New chest pain or pain under the shoulder blades  Painful or persistently difficult swallowing  New shortness of breath  Fever of 100F or higher  Black, tarry-looking stools   For urgent or emergent issues, a gastroenterologist can be reached at any hour by calling [phone]. Do not use MyChart messaging for urgent concerns.     DIET:  We do recommend a small meal at first, but then you may proceed to your regular diet.  Drink plenty of fluids but you should avoid alcoholic beverages for 24 hours.  ACTIVITY:  You should plan to take it easy for the rest of today and you should NOT DRIVE or use heavy machinery until tomorrow (because of the sedation medicines used during the test).    FOLLOW UP: Our staff will call the number listed on your records the next business day following your procedure.  We will call around 7:15- 8:00 am to check on you and address any questions or concerns that you may have regarding the information given to you following your procedure. If we do not reach you, we will leave a message.  If you develop any symptoms (ie: fever, flu-like symptoms, shortness of breath, cough etc.) before then, please call [phone].  If you test positive for Covid 19 in the 2 weeks post procedure, please call and report this information to [place].    If any biopsies were taken you will be contacted by phone or by letter within the next 1-3 weeks.  Please call us at [phone] if you have not heard about the biopsies in 3 weeks.    SIGNATURES/CONFIDENTIALITY: You and/or your care partner have signed paperwork which will be entered into your electronic medical record.  These signatures attest to the fact that that the information above on your After Visit Summary  has been reviewed and is understood.  Full responsibility of the confidentiality of this discharge information lies with you and/or your care-partner.  

## 2022-02-16 NOTE — Op Note (Signed)
[place] Patient Name: Candice Jones Procedure Date: 02/16/2022 2:01 PM MRN: [id number] Endoscopist: Gatha Mayer , MD Age: 46 Referring MD:  Date of Birth: 1976/05/19 Gender: Female Account #: [id number] Procedure:                Upper GI endoscopy Indications:              Esophageal reflux symptoms that persist despite                            appropriate therapy Medicines:                Monitored Anesthesia Care Procedure:                Pre-Anesthesia Assessment:                           - Prior to the procedure, a History and Physical                            was performed, and patient medications and                            allergies were reviewed. The patient's tolerance of                            previous anesthesia was also reviewed. The risks                            and benefits of the procedure and the sedation                            options and risks were discussed with the patient.                            All questions were answered, and informed consent                            was obtained. Prior Anticoagulants: The patient has                            taken no previous anticoagulant or antiplatelet                            agents. ASA Grade Assessment: II - A patient with                            mild systemic disease. After reviewing the risks                            and benefits, the patient was deemed in                            satisfactory condition to undergo the procedure.  After obtaining informed consent, the endoscope was                            passed under direct vision. Throughout the                            procedure, the patient's blood pressure, pulse, and                            oxygen saturations were monitored continuously. The                            GIF [id number] #[id number] was introduced through the                            mouth, and advanced to the second part of  duodenum.                            The upper GI endoscopy was accomplished without                            difficulty. The patient tolerated the procedure                            well. Scope In: Scope Out: Findings:                 A 2 cm hiatal hernia was present.                           The gastroesophageal flap valve was visualized                            endoscopically and classified as Hill Grade II                            (fold present, opens with respiration).                           Diffuse moderately erythematous mucosa was found in                            the entire examined stomach. Biopsies were taken                            with a cold forceps for histology. Verification of                            patient identification for the specimen was done.                            Estimated blood loss was minimal.                           The exam was otherwise without abnormality.  The cardia and gastric fundus were normal on                            retroflexion. Complications:            No immediate complications. Estimated Blood Loss:     Estimated blood loss was minimal. Impression:               - 2 cm hiatal hernia.                           - Gastroesophageal flap valve classified as Hill                            Grade II (fold present, opens with respiration).                           - Erythematous mucosa in the stomach. Biopsied.                           - The examination was otherwise normal. No signs of                            acid reflux damage Recommendation:           - Patient has a contact number available for                            emergencies. The signs and symptoms of potential                            delayed complications were discussed with the                            patient. Return to normal activities tomorrow.                            Written discharge instructions were provided to  the                            patient.                           - GERD diet.                           - Continue present medications. She is better on                            bid PPI                           - Await pathology results. Iva Boop, MD 02/16/2022 2:19:44 PM This report has been signed electronically.

## 2022-02-16 NOTE — Progress Notes (Signed)
Pt's states no medical or surgical changes since previsit or office visit. 

## 2022-02-16 NOTE — Progress Notes (Signed)
Report given to PACU, vss 

## 2022-02-16 NOTE — Progress Notes (Signed)
1401 Robinul 0.1 mg IV given due large amount of secretions upon assessment.  MD made aware, vss

## 2022-02-17 ENCOUNTER — Telehealth: Payer: Self-pay | Admitting: *Deleted

## 2022-02-17 NOTE — Telephone Encounter (Signed)
  Follow up Call-     02/16/2022    1:27 PM  Call back number  Post procedure Call Back phone  # [phone]  Permission to leave phone message Yes     Patient questions:  Do you have a fever, pain , or abdominal swelling? No. Pain Score  0 *  Have you tolerated food without any problems? Yes.    Have you been able to return to your normal activities? Yes.    Do you have any questions about your discharge instructions: Diet   No. Medications  No. Follow up visit  No.  Do you have questions or concerns about your Care? No.  Actions: * If pain score is 4 or above: No action needed, pain <4.

## 2022-02-23 ENCOUNTER — Encounter: Payer: Self-pay | Admitting: Internal Medicine

## 2022-03-12 ENCOUNTER — Other Ambulatory Visit: Payer: Self-pay | Admitting: Family Medicine

## 2022-03-14 NOTE — Telephone Encounter (Signed)
Requested medication (s) are due for refill today: yes  Requested medication (s) are on the active medication list: yes  Last refill:  10/04/21 #90 1 RF  Future visit scheduled: no  Notes to clinic:  overdue lab work   Requested Prescriptions  Pending Prescriptions Disp Refills   losartan (COZAAR) 25 MG tablet [Pharmacy Med Name: LOSARTAN POTASSIUM 25 MG TAB] 90 tablet 1    Sig: TAKE 1 TABLET (25 MG TOTAL) BY MOUTH DAILY.     Cardiovascular:  Angiotensin Receptor Blockers Failed - 03/12/2022  3:56 PM      Failed - Cr in normal range and within 180 days    Creat  Date Value Ref Range Status  08/27/2021 0.66 0.50 - 0.99 mg/dL Final         Failed - K in normal range and within 180 days    Potassium  Date Value Ref Range Status  08/27/2021 3.9 3.5 - 5.3 mmol/L Final         Passed - Patient is not pregnant      Passed - Last BP in normal range    BP Readings from Last 1 Encounters:  02/16/22 123/77         Passed - Valid encounter within last 6 months    Recent Outpatient Visits           4 months ago Acute cystitis without hematuria   [facility] Donita Brooks, MD   6 months ago Fatigue, unspecified type   [facility] Donita Brooks, MD   1 year ago Acute midline low back pain with left-sided sciatica   [facility] Valentino Nose, NP   1 year ago Controlled type 2 diabetes mellitus without complication, without long-term current use of insulin (HCC)   [facility] Pickard, Priscille Heidelberg, MD   1 year ago COVID-19   [facility] Pickard, Priscille Heidelberg, MD

## 2022-04-15 ENCOUNTER — Other Ambulatory Visit: Payer: Self-pay | Admitting: Family Medicine

## 2022-05-02 ENCOUNTER — Encounter: Payer: Self-pay | Admitting: Family Medicine

## 2022-05-31 ENCOUNTER — Ambulatory Visit: Payer: BC Managed Care – PPO

## 2022-05-31 DIAGNOSIS — Z23 Encounter for immunization: Secondary | ICD-10-CM

## 2022-06-19 ENCOUNTER — Other Ambulatory Visit: Payer: Self-pay

## 2022-06-19 ENCOUNTER — Encounter: Payer: Self-pay | Admitting: Emergency Medicine

## 2022-06-19 ENCOUNTER — Ambulatory Visit
Admission: EM | Admit: 2022-06-19 | Discharge: 2022-06-19 | Disposition: A | Discharge status: Home / Self Care | Payer: BC Managed Care – PPO

## 2022-06-19 DIAGNOSIS — R21 Rash and other nonspecific skin eruption: Secondary | ICD-10-CM

## 2022-06-19 DIAGNOSIS — L299 Pruritus, unspecified: Secondary | ICD-10-CM

## 2022-06-19 MED ORDER — METHYLPREDNISOLONE SODIUM SUCC 125 MG IJ SOLR
80.0000 mg | Freq: Once | INTRAMUSCULAR | Status: AC
  Administered 2022-06-19: 80 mg via INTRAMUSCULAR

## 2022-06-19 NOTE — ED Triage Notes (Addendum)
Pt reports generalized itching, change in complexion, "rash to back of head", nasal congestion since Wednesday. Pt denies any new self care products. Pt reports history of similar when discovered allergy to sulfa. Denies any new medications.

## 2022-06-19 NOTE — ED Provider Notes (Signed)
[facility]    CSN: [id number] Arrival date & time: 06/19/22  1333      History   Chief Complaint Chief Complaint  Patient presents with   Rash    HPI Candice Jones is a 46 y.o. female.   Patient presenting today with generalized itching to majority of body, rash to the back of her scalp and neck.  States she noticed the rash on Wednesday but the itching started yesterday across her body.  Denies throat itching or swelling, difficulty breathing or swallowing, wheezing, chest tightness, nausea, vomiting, diarrhea.  No new medications, exposures to anything that she is aware of, new foods.  Tried a Benadryl this morning with no relief.    Past Medical History:  Diagnosis Date   Adenomyosis    Asthma    COVID-19    Diabetes ([place])    Gall stones    Gestational diabetes mellitus    Hiatal hernia    Hyperlipidemia    IBS (irritable bowel syndrome)    Internal hemorrhoids    Kidney stones    Meniere's syndrome    Vertigo     Patient Active Problem List   Diagnosis Date Noted   Family history of colon cancer - sister < 60 02/02/2017   Internal hemorrhoids with bleeding and prolapse 06/24/2014   IBS (irritable bowel syndrome) 06/24/2014   Endometriosis 03/03/2013   Elevated fasting glucose 03/01/2013   Gallstones 05/12/2011    Past Surgical History:  Procedure Laterality Date   CESAREAN SECTION  08/1999, 05/2003   CHOLECYSTECTOMY  2011   CYSTECTOMY  1997   left wrist    ENDOMETRIAL ABLATION  01/2010   HEMORRHOID BANDING  2015   MINOR HEMORRHOIDECTOMY  04/2014   thrombosed   WISDOM TOOTH EXTRACTION  1996/1997    OB History   No obstetric history on file.      Home Medications    Prior to Admission medications   Medication Sig Start Date End Date Taking? Authorizing Provider  APPLE CIDER VINEGAR PO Take by mouth. Gummies, includes b6 and b12. Has been taking x4 months.   Yes [provider]  Misc Natural Products (ELDERBERRY ZINC/VIT  C/IMMUNE MT) Use as directed in the mouth or throat.   Yes [provider]  albuterol (VENTOLIN HFA) 108 (90 Base) MCG/ACT inhaler Inhale 2 puffs into the lungs every 4 (four) hours as needed for wheezing or shortness of breath. 02/11/20   Annie Main, FNP  Ascorbic Acid (VITAMIN C PO) Take 1 tablet by mouth every morning.    [provider]  Azelastine HCl 137 MCG/SPRAY SOLN Place 2 sprays into both nostrils every morning. 04/13/21   [provider]  Blood Glucose Monitoring Suppl KIT Use to check fasting glucose daily ICD10: R73.01.Dispense based on insurance preference 07/30/19   Susy Frizzle, MD  Cholecalciferol (VITAMIN D3 PO) Take 1 tablet by mouth every morning.    [provider]  Cranberry-Vitamin C-Vitamin E (CRANBERRY PLUS VITAMIN C) 4200-20-3 MG-MG-UNIT CAPS Take 2 capsules by mouth every morning.    [provider]  docusate sodium (COLACE) 100 MG capsule Take 100 mg by mouth every morning.    [provider]  fluticasone (FLONASE) 50 MCG/ACT nasal spray SPRAY 2 SPRAYS INTO EACH NOSTRIL EVERY DAY Patient taking differently: Place 2 sprays into both nostrils daily as needed for allergies or rhinitis. 03/19/20   Ishmael Holter A, FNP  glucose blood test strip Use with glucose meter to  check blood sugar.ICD10: R73.01.Dispense based on insurance preference 02/09/18   Susy Frizzle, MD  Lancets (FREESTYLE) lancets Use to check blood sugar ICD10: R73.01.Dispense based on insurance preference 02/09/18   Susy Frizzle, MD  levocetirizine (XYZAL) 5 MG tablet Take 5 mg by mouth every morning.    [provider]  losartan (COZAAR) 25 MG tablet TAKE 1 TABLET (25 MG TOTAL) BY MOUTH DAILY. 03/14/22   Susy Frizzle, MD  metFORMIN (GLUCOPHAGE-XR) 500 MG 24 hr tablet TAKE 2 TABLETS BY MOUTH EVERY DAY WITH BREAKFAST 08/09/21   Susy Frizzle, MD  Multiple Vitamin (MULTIVITAMIN WITH MINERALS) TABS tablet Take 1 tablet by mouth  every morning.    [provider]  Multiple Vitamins-Minerals (IMMUNE SUPPORT) CHEW Chew 2 tablets by mouth every morning.    [provider]  norethindrone-ethinyl estradiol (LOESTRIN) 1-20 MG-MCG tablet Take 1 tablet by mouth every morning.    [provider]  pantoprazole (PROTONIX) 40 MG tablet TAKE 1 TABLET BY MOUTH TWICE A DAY 04/15/22   Susy Frizzle, MD  PEPPERMINT OIL PO Take 1 capsule by mouth every morning.    [provider]  pravastatin (PRAVACHOL) 20 MG tablet Take 1 tablet (20 mg total) by mouth daily. Patient not taking: Reported on 06/19/2022 08/10/21   Susy Frizzle, MD  triamterene-hydrochlorothiazide (MAXZIDE-25) 37.5-25 MG tablet Take 1 tablet by mouth every morning. 04/02/21   [provider]  VITAMIN A PO Take 1 capsule by mouth every morning.    [provider]    Family History Family History  Problem Relation Age of Onset   High Cholesterol Mother    Hypertension Mother    Heart disease Mother    Diabetes Father    High Cholesterol Father    Colon cancer Sister        negative for Lynch syndrome   Diverticulitis Sister    Diabetes Maternal Grandmother    Heart disease Maternal Grandmother    Hypertension Maternal Grandmother    High Cholesterol Maternal Grandmother    Kidney cancer Maternal Grandmother    Heart disease Maternal Grandfather    High Cholesterol Maternal Grandfather    Cancer Maternal Grandfather    Diabetes Paternal Grandmother    Prostate cancer Paternal Grandfather    Lupus Sister     Social History Social History   Tobacco Use   Smoking status: Never   Smokeless tobacco: Never  Vaping Use   Vaping Use: Never used  Substance Use Topics   Alcohol use: Yes    Comment: rarely   Drug use: No     Allergies   Augmentin [amoxicillin-pot clavulanate], Codeine, Ciprofloxacin hcl, Elemental sulfur, Erythromycin, and Sulfa antibiotics   Review of Systems Review of  Systems PER HPI  Physical Exam Triage Vital Signs ED Triage Vitals [06/19/22 1359]  Enc Vitals Group     BP (!) 149/90     Pulse Rate (!) 109     Resp 20     Temp 98.8 F (37.1 C)     Temp Source Oral     SpO2 96 %     Weight      Height      Head Circumference      Peak Flow      Pain Score 0     Pain Loc      Pain Edu?      Excl. in [place]?    No data found.  Updated Vital Signs  BP (!) 149/90 (BP Location: Right Arm)   Pulse (!) 109   Temp 98.8 F (37.1 C) (Oral)   Resp 20   SpO2 96%   Visual Acuity Right Eye Distance:   Left Eye Distance:   Bilateral Distance:    Right Eye Near:   Left Eye Near:    Bilateral Near:     Physical Exam Vitals and nursing note reviewed.  Constitutional:      Appearance: Normal appearance. She is not ill-appearing.  HENT:     Head: Atraumatic.  Eyes:     Extraocular Movements: Extraocular movements intact.     Conjunctiva/sclera: Conjunctivae normal.  Cardiovascular:     Rate and Rhythm: Normal rate and regular rhythm.     Heart sounds: Normal heart sounds.  Pulmonary:     Effort: Pulmonary effort is normal.     Breath sounds: Normal breath sounds.  Musculoskeletal:        General: Normal range of motion.     Cervical back: Normal range of motion and neck supple.  Skin:    General: Skin is warm and dry.     Findings: Rash present.     Comments: Subtle finding form rash to posterior neck/upper back, erythematous macular subtle rash to base of scalp  Neurological:     Mental Status: She is alert and oriented to person, place, and time.  Psychiatric:        Mood and Affect: Mood normal.        Thought Content: Thought content normal.        Judgment: Judgment normal.      UC Treatments / Results  Labs (all labs ordered are listed, but only abnormal results are displayed) Labs Reviewed - No data to display  EKG   Radiology No results found.  Procedures Procedures (including critical care time)  Medications  Ordered in UC Medications  methylPREDNISolone sodium succinate (SOLU-MEDROL) 125 mg/2 mL injection 80 mg (80 mg Intramuscular Given 06/19/22 1452)    Initial Impression / Assessment and Plan / UC Course  I have reviewed the triage vital signs and the nursing notes.  Pertinent labs & imaging results that were available during my care of the patient were reviewed by me and considered in my medical decision making (see chart for details).     Overall very well-appearing in no acute distress.  Unclear if irritant versus allergic rash at this time, given her widespread itching sensation and lack of response to topical remedies will treat with IM Solu-Medrol in addition to antihistamines.  Continue topical care additionally.  Return for any worsening symptoms.  Final Clinical Impressions(s) / UC Diagnoses   Final diagnoses:  Itching  Rash   Discharge Instructions   None    ED Prescriptions   None    PDMP not reviewed this encounter.   Volney American, [place] 06/19/22 1453

## 2022-06-30 ENCOUNTER — Ambulatory Visit: Payer: BC Managed Care – PPO | Admitting: Family Medicine

## 2022-07-03 ENCOUNTER — Other Ambulatory Visit: Payer: Self-pay | Admitting: Family Medicine

## 2022-07-05 ENCOUNTER — Ambulatory Visit: Payer: BC Managed Care – PPO | Admitting: Family Medicine

## 2022-07-11 ENCOUNTER — Encounter: Payer: Self-pay | Admitting: Family Medicine

## 2022-07-11 ENCOUNTER — Ambulatory Visit: Payer: BC Managed Care – PPO | Admitting: Family Medicine

## 2022-07-11 VITALS — BP 138/76 | HR 101 | Ht 64.0 in | Wt 204.0 lb

## 2022-07-11 DIAGNOSIS — R202 Paresthesia of skin: Secondary | ICD-10-CM | POA: Diagnosis not present

## 2022-07-11 MED ORDER — FLUOCINOLONE ACETONIDE BODY 0.01 % EX OIL: 1.0000 | TOPICAL_OIL | CUTANEOUS | 1 refills | Status: DC

## 2022-07-11 MED ORDER — GABAPENTIN 300 MG PO CAPS: 300.0000 mg | ORAL_CAPSULE | Freq: Three times a day (TID) | ORAL | 3 refills | Status: DC | PRN

## 2022-07-11 NOTE — Progress Notes (Signed)
Subjective:    Patient ID: Candice Jones, female    DOB: May 16, 1976, 46 y.o.   MRN: [id number]  Patient presents today with severe itching in her scalp.  She states that it started in early November.  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  However she is evaluated with her scalp thoroughly and has not seen any lice.  Shortly thereafter she developed intense itching in the scalp.  The itching is to the point that is waking her up even at 3 in the morning.  She is tried Benadryl with no success.  She has a history of seborrheic dermatitis and she has tried Nizoral shampoo and even colloidal oatmeal creams with no success.  On examination today, her scalp looks completely normal.  There is no visible rash.  There is no plaque.  There is no scaling.  There is no evidence of lice or nits.  There is no other rash anywhere else on her body.  However the patient is constantly scratching at her scalp while she is talking to me.  She denies any itching in her private area or in her axilla or on her back or in her abdomen or legs.  There is no itching on her hands.  Past Medical History:  Diagnosis Date   Adenomyosis    Asthma    COVID-19    Diabetes ([place])    Gall stones    Gestational diabetes mellitus    Hiatal hernia    Hyperlipidemia    IBS (irritable bowel syndrome)    Internal hemorrhoids    Kidney stones    Meniere's syndrome    Vertigo    Past Surgical History:  Procedure Laterality Date   CESAREAN SECTION  08/1999, 05/2003   CHOLECYSTECTOMY  2011   CYSTECTOMY  1997   left wrist    ENDOMETRIAL ABLATION  01/2010   HEMORRHOID BANDING  2015   MINOR HEMORRHOIDECTOMY  04/2014   thrombosed   [place] EXTRACTION  1996/1997   Current Outpatient Medications on File Prior to Visit  Medication Sig Dispense Refill   albuterol (VENTOLIN HFA) 108 (90 Base) MCG/ACT inhaler Inhale 2 puffs into the lungs every 4 (four) hours as needed for wheezing or shortness of breath. 18 g 0   APPLE  CIDER VINEGAR PO Take by mouth. Gummies, includes b6 and b12. Has been taking x4 months.     Ascorbic Acid (VITAMIN C PO) Take 1 tablet by mouth every morning.     Azelastine HCl 137 MCG/SPRAY SOLN Place 2 sprays into both nostrils every morning.     Blood Glucose Monitoring Suppl KIT Use to check fasting glucose daily ICD10: R73.01.Dispense based on insurance preference 1 kit 0   Cholecalciferol (VITAMIN D3 PO) Take 1 tablet by mouth every morning.     Cranberry-Vitamin C-Vitamin E (CRANBERRY PLUS VITAMIN C) 4200-20-3 MG-MG-UNIT CAPS Take 2 capsules by mouth every morning.     docusate sodium (COLACE) 100 MG capsule Take 100 mg by mouth every morning.     fluticasone (FLONASE) 50 MCG/ACT nasal spray SPRAY 2 SPRAYS INTO EACH NOSTRIL EVERY DAY (Patient taking differently: Place 2 sprays into both nostrils daily as needed for allergies or rhinitis.) 48 mL 1   glucose blood test strip Use with glucose meter to check blood sugar.ICD10: R73.01.Dispense based on insurance preference 100 each 12   Lancets (FREESTYLE) lancets Use to check blood sugar ICD10: R73.01.Dispense based on insurance preference 100 each 12  levocetirizine (XYZAL) 5 MG tablet Take 5 mg by mouth every morning.     losartan (COZAAR) 25 MG tablet TAKE 1 TABLET (25 MG TOTAL) BY MOUTH DAILY. 90 tablet 3   metFORMIN (GLUCOPHAGE-XR) 500 MG 24 hr tablet TAKE 2 TABLETS BY MOUTH EVERY DAY WITH BREAKFAST 180 tablet 3   Misc Natural Products (ELDERBERRY ZINC/VIT C/IMMUNE MT) Use as directed in the mouth or throat.     Multiple Vitamin (MULTIVITAMIN WITH MINERALS) TABS tablet Take 1 tablet by mouth every morning.     Multiple Vitamins-Minerals (IMMUNE SUPPORT) CHEW Chew 2 tablets by mouth every morning.     norethindrone-ethinyl estradiol (LOESTRIN) 1-20 MG-MCG tablet Take 1 tablet by mouth every morning.     pantoprazole (PROTONIX) 40 MG tablet TAKE 1 TABLET BY MOUTH TWICE A DAY 180 tablet 1   PEPPERMINT OIL PO Take 1 capsule by mouth every  morning.     pravastatin (PRAVACHOL) 20 MG tablet TAKE 1 TABLET BY MOUTH EVERY DAY 90 tablet 3   triamterene-hydrochlorothiazide (MAXZIDE-25) 37.5-25 MG tablet Take 1 tablet by mouth every morning.     VITAMIN A PO Take 1 capsule by mouth every morning.     No current facility-administered medications on file prior to visit.   Allergies  Allergen Reactions   Augmentin [Amoxicillin-Pot Clavulanate] Nausea And Vomiting    Severe nausea/vomiting    Codeine Other (See Comments)    Makes pain worse   Ciprofloxacin Hcl Rash   Elemental Sulfur Rash   Erythromycin Rash   Sulfa Antibiotics Rash   Social History   Socioeconomic History   Marital status: Married    Spouse name: Not on file   Number of children: 3   Years of education: Not on file   Highest education level: Not on file  Occupational History   Occupation: Set designer: [employer]  Tobacco Use   Smoking status: Never   Smokeless tobacco: Never  Vaping Use   Vaping Use: Never used  Substance and Sexual Activity   Alcohol use: Yes    Comment: rarely   Drug use: No   Sexual activity: Yes  Other Topics Concern   Not on file  Social History Narrative   Married, 2 sons and 1 daughter   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   No caffeine alcohol tobacco or drugs   Social Determinants of Radio broadcast assistant Strain: Not on file  Food Insecurity: Not on file  Transportation Needs: Not on file  Physical Activity: Not on file  Stress: Not on file  Social Connections: Not on file  Intimate Partner Violence: Not on file      Review of Systems  Constitutional: Negative.   All other systems reviewed and are negative.      Objective:   Physical Exam Vitals reviewed.  Constitutional:      General: She is not in acute distress.    Appearance: Normal appearance. She is well-developed and normal weight. She is not ill-appearing, toxic-appearing or  diaphoretic.  HENT:     Head: Normocephalic and atraumatic.  Cardiovascular:     Rate and Rhythm: Normal rate and regular rhythm.     Heart sounds: Normal heart sounds. No murmur heard.    No friction rub. No gallop.  Pulmonary:     Effort: Pulmonary effort is normal. No respiratory distress.     Breath sounds: Normal breath sounds. No stridor. No wheezing, rhonchi or  rales.  Chest:     Chest wall: No tenderness.  Abdominal:     Tenderness: There is no abdominal tenderness. There is no guarding.  Musculoskeletal:     Right lower leg: No edema.     Left lower leg: No edema.  Skin:    General: Skin is warm.     Coloration: Skin is not jaundiced or pale.     Findings: No bruising, erythema, lesion or rash.  Neurological:     General: No focal deficit present.     Mental Status: She is alert and oriented to person, place, and time. Mental status is at baseline.     Cranial Nerves: No cranial nerve deficit.     Sensory: No sensory deficit.     Motor: No weakness.     Gait: Gait normal.  Psychiatric:        Mood and Affect: Mood normal.        Behavior: Behavior normal.        Thought Content: Thought content normal.        Judgment: Judgment normal.           Assessment & Plan:  Paresthesia - Plan: CBC with Differential/Platelet, COMPLETE METABOLIC PANEL WITH GFR, Hemoglobin A1c, Vitamin B12, TSH  I do not see any abnormality in the scalp.  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  Other possibilities would be scalp psoriasis although I do not see any rash, seborrheic dermatitis although there is no visible rash, anxiety, etc.  Plan to try the patient with Derma-Smoothe ointment and have her work this into the scalp tonight to see if this helps with.  If it does help with symptoms, would point towards some type of skin condition.  However if there is no improvement on the topical steroid, I would like her to try gabapentin 300 mg p.o. 3 times daily as needed for itching for possible  neurodermatitis

## 2022-07-12 ENCOUNTER — Encounter: Payer: Self-pay | Admitting: Family Medicine

## 2022-07-12 ENCOUNTER — Other Ambulatory Visit: Payer: Self-pay | Admitting: Family Medicine

## 2022-07-12 ENCOUNTER — Ambulatory Visit: Payer: BC Managed Care – PPO | Admitting: Family Medicine

## 2022-07-12 LAB — CBC WITH DIFFERENTIAL/PLATELET
Absolute Monocytes: 578 cells/uL (ref 200–950)
Basophils Absolute: 43 cells/uL (ref 0–200)
Basophils Relative: 0.5 %
Eosinophils Absolute: 77 cells/uL (ref 15–500)
Eosinophils Relative: 0.9 %
HCT: 45.1 % — ABNORMAL HIGH (ref 35.0–45.0)
Hemoglobin: 15.7 g/dL — ABNORMAL HIGH (ref 11.7–15.5)
Lymphs Abs: 3290 cells/uL (ref 850–3900)
MCH: 30.7 pg (ref 27.0–33.0)
MCHC: 34.8 g/dL (ref 32.0–36.0)
MCV: 88.1 fL (ref 80.0–100.0)
MPV: 9.1 fL (ref 7.5–12.5)
Monocytes Relative: 6.8 %
Neutro Abs: 4514 cells/uL (ref 1500–7800)
Neutrophils Relative %: 53.1 %
Platelets: 396 10*3/uL (ref 140–400)
RBC: 5.12 10*6/uL — ABNORMAL HIGH (ref 3.80–5.10)
RDW: 12 % (ref 11.0–15.0)
Total Lymphocyte: 38.7 %
WBC: 8.5 10*3/uL (ref 3.8–10.8)

## 2022-07-12 LAB — COMPLETE METABOLIC PANEL WITH GFR
AG Ratio: 1.7 (calc) (ref 1.0–2.5)
ALT: 28 U/L (ref 6–29)
AST: 23 U/L (ref 10–35)
Albumin: 4.2 g/dL (ref 3.6–5.1)
Alkaline phosphatase (APISO): 68 U/L (ref 31–125)
BUN: 10 mg/dL (ref 7–25)
CO2: 27 mmol/L (ref 20–32)
Calcium: 9.5 mg/dL (ref 8.6–10.2)
Chloride: 99 mmol/L (ref 98–110)
Creat: 0.61 mg/dL (ref 0.50–0.99)
Globulin: 2.5 g/dL (calc) (ref 1.9–3.7)
Glucose, Bld: 227 mg/dL — ABNORMAL HIGH (ref 65–99)
Potassium: 3.6 mmol/L (ref 3.5–5.3)
Sodium: 137 mmol/L (ref 135–146)
Total Bilirubin: 0.3 mg/dL (ref 0.2–1.2)
Total Protein: 6.7 g/dL (ref 6.1–8.1)
eGFR: 111 mL/min/{1.73_m2} (ref >=60)

## 2022-07-12 LAB — HEMOGLOBIN A1C
Hgb A1c MFr Bld: 8.4 % of total Hgb — ABNORMAL HIGH (ref <5.7)
Mean Plasma Glucose: 194 mg/dL
eAG (mmol/L): 10.8 mmol/L

## 2022-07-12 LAB — TSH: TSH: 3 mIU/L

## 2022-07-12 LAB — VITAMIN B12: Vitamin B-12: 579 pg/mL (ref 200–1100)

## 2022-07-12 MED ORDER — IVERMECTIN 0.5 % EX LOTN: 1.0000 | TOPICAL_LOTION | Freq: Once | CUTANEOUS | 1 refills | Status: AC

## 2022-07-12 NOTE — Telephone Encounter (Signed)
Refilled 04/15/2022 #180 1 rf. Requested Prescriptions  Pending Prescriptions Disp Refills   pantoprazole (PROTONIX) 40 MG tablet [Pharmacy Med Name: PANTOPRAZOLE SOD DR 40 MG TAB] 180 tablet 1    Sig: TAKE 1 TABLET BY MOUTH TWICE A DAY     Gastroenterology: Proton Pump Inhibitors Passed - 07/12/2022  4:34 PM      Passed - Valid encounter within last 12 months    Recent Outpatient Visits           8 months ago Acute cystitis without hematuria   [facility] Donita Brooks, MD   10 months ago Fatigue, unspecified type   [facility] Donita Brooks, MD   1 year ago Acute midline low back pain with left-sided sciatica   [facility] Valentino Nose, NP   1 year ago Controlled type 2 diabetes mellitus without complication, without long-term current use of insulin (HCC)   [facility] Pickard, Priscille Heidelberg, MD   1 year ago COVID-19   [facility] Pickard, Priscille Heidelberg, MD       Future Appointments             In 1 week Pickard, Priscille Heidelberg, MD [facility]

## 2022-07-14 ENCOUNTER — Other Ambulatory Visit: Payer: Self-pay

## 2022-07-14 DIAGNOSIS — E119 Type 2 diabetes mellitus without complications: Secondary | ICD-10-CM

## 2022-07-14 DIAGNOSIS — E1165 Type 2 diabetes mellitus with hyperglycemia: Secondary | ICD-10-CM

## 2022-07-14 MED ORDER — OZEMPIC (0.25 OR 0.5 MG/DOSE) 2 MG/3ML ~~LOC~~ SOPN: 0.5000 mg | PEN_INJECTOR | SUBCUTANEOUS | 3 refills | Status: DC

## 2022-07-17 ENCOUNTER — Other Ambulatory Visit: Payer: Self-pay | Admitting: Family Medicine

## 2022-07-18 MED ORDER — BLOOD GLUCOSE MONITORING SUPPL KIT: PACK | 0 refills | Status: AC

## 2022-07-21 ENCOUNTER — Ambulatory Visit (INDEPENDENT_AMBULATORY_CARE_PROVIDER_SITE_OTHER): Payer: BC Managed Care – PPO | Admitting: Family Medicine

## 2022-07-21 VITALS — BP 136/84 | HR 93 | Ht 64.0 in | Wt 202.0 lb

## 2022-07-21 DIAGNOSIS — Z0000 Encounter for general adult medical examination without abnormal findings: Secondary | ICD-10-CM

## 2022-07-21 DIAGNOSIS — E119 Type 2 diabetes mellitus without complications: Secondary | ICD-10-CM

## 2022-07-21 NOTE — Progress Notes (Signed)
Subjective:    Patient ID: Candice Jones, female    DOB: 1975/12/16, 46 y.o.   MRN: [id number]  HPI  Patient is a very pleasant 46 year old Caucasian female who is here today for physical exam.  She sees her gynecologist who does her Pap smears, mammogram.  She had a colonoscopy in the past due to a strong family history of colon cancer.  She is due in 2 years for her next colonoscopy.  Her most recent lab work showed a hemoglobin A1c greater than 8.  She is due for a cholesterol test.  She is also due for urine protein to creatinine ratio.   Past Medical History:  Diagnosis Date   Adenomyosis    Asthma    COVID-19    Diabetes ([place])    Gall stones    Gestational diabetes mellitus    Hiatal hernia    Hyperlipidemia    IBS (irritable bowel syndrome)    Internal hemorrhoids    Kidney stones    Meniere's syndrome    Vertigo    Past Surgical History:  Procedure Laterality Date   CESAREAN SECTION  08/1999, 05/2003   CHOLECYSTECTOMY  2011   CYSTECTOMY  1997   left wrist    ENDOMETRIAL ABLATION  01/2010   HEMORRHOID BANDING  2015   MINOR HEMORRHOIDECTOMY  04/2014   thrombosed   [place] EXTRACTION  1996/1997   Current Outpatient Medications on File Prior to Visit  Medication Sig Dispense Refill   albuterol (VENTOLIN HFA) 108 (90 Base) MCG/ACT inhaler Inhale 2 puffs into the lungs every 4 (four) hours as needed for wheezing or shortness of breath. 18 g 0   APPLE CIDER VINEGAR PO Take by mouth. Gummies, includes b6 and b12. Has been taking x4 months.     Ascorbic Acid (VITAMIN C PO) Take 1 tablet by mouth every morning.     Azelastine HCl 137 MCG/SPRAY SOLN Place 2 sprays into both nostrils every morning.     Blood Glucose Monitoring Suppl KIT Use to check fasting glucose daily ICD10: R73.01.Dispense based on insurance preference 1 kit 0   Cholecalciferol (VITAMIN D3 PO) Take 1 tablet by mouth every morning.     Cranberry-Vitamin C-Vitamin E (CRANBERRY PLUS VITAMIN C) 4200-20-3  MG-MG-UNIT CAPS Take 2 capsules by mouth every morning.     docusate sodium (COLACE) 100 MG capsule Take 100 mg by mouth every morning.     Fluocinolone Acetonide Body (DERMA-SMOOTHE/FS BODY) 0.01 % OIL Apply 1 Application topically 2 (two) times a week. 120 mL 1   fluticasone (FLONASE) 50 MCG/ACT nasal spray SPRAY 2 SPRAYS INTO EACH NOSTRIL EVERY DAY (Patient taking differently: Place 2 sprays into both nostrils daily as needed for allergies or rhinitis.) 48 mL 1   gabapentin (NEURONTIN) 300 MG capsule Take 1 capsule (300 mg total) by mouth 3 (three) times daily as needed (itching). 90 capsule 3   glucose blood test strip Use with glucose meter to check blood sugar.ICD10: R73.01.Dispense based on insurance preference 100 each 12   Lancets (FREESTYLE) lancets Use to check blood sugar ICD10: R73.01.Dispense based on insurance preference 100 each 12   levocetirizine (XYZAL) 5 MG tablet Take 5 mg by mouth every morning.     losartan (COZAAR) 25 MG tablet TAKE 1 TABLET (25 MG TOTAL) BY MOUTH DAILY. 90 tablet 3   metFORMIN (GLUCOPHAGE-XR) 500 MG 24 hr tablet TAKE 2 TABLETS BY MOUTH EVERY DAY WITH BREAKFAST 180 tablet 3   Misc Natural  Products (ELDERBERRY ZINC/VIT C/IMMUNE MT) Use as directed in the mouth or throat.     Multiple Vitamin (MULTIVITAMIN WITH MINERALS) TABS tablet Take 1 tablet by mouth every morning.     Multiple Vitamins-Minerals (IMMUNE SUPPORT) CHEW Chew 2 tablets by mouth every morning.     norethindrone-ethinyl estradiol (LOESTRIN) 1-20 MG-MCG tablet Take 1 tablet by mouth every morning.     pantoprazole (PROTONIX) 40 MG tablet TAKE 1 TABLET BY MOUTH TWICE A DAY 180 tablet 1   PEPPERMINT OIL PO Take 1 capsule by mouth every morning.     pravastatin (PRAVACHOL) 20 MG tablet TAKE 1 TABLET BY MOUTH EVERY DAY 90 tablet 3   Semaglutide,0.25 or 0.5MG/DOS, (OZEMPIC, 0.25 OR 0.5 MG/DOSE,) 2 MG/3ML SOPN Inject 0.5 mg into the skin once a week. 3 mL 3   triamterene-hydrochlorothiazide  (MAXZIDE-25) 37.5-25 MG tablet Take 1 tablet by mouth every morning.     VITAMIN A PO Take 1 capsule by mouth every morning.     No current facility-administered medications on file prior to visit.   Allergies  Allergen Reactions   Augmentin [Amoxicillin-Pot Clavulanate] Nausea And Vomiting    Severe nausea/vomiting    Codeine Other (See Comments)    Makes pain worse   Ciprofloxacin Hcl Rash   Erythromycin Rash   Sulfa Antibiotics Rash   Social History   Socioeconomic History   Marital status: Married    Spouse name: Not on file   Number of children: 3   Years of education: Not on file   Highest education level: Not on file  Occupational History   Occupation: Set designer: [employer]  Tobacco Use   Smoking status: Never   Smokeless tobacco: Never  Vaping Use   Vaping Use: Never used  Substance and Sexual Activity   Alcohol use: Yes    Comment: rarely   Drug use: No   Sexual activity: Yes  Other Topics Concern   Not on file  Social History Narrative   Married, 2 sons and 1 daughter   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   No caffeine alcohol tobacco or drugs   Social Determinants of Radio broadcast assistant Strain: Not on file  Food Insecurity: Not on file  Transportation Needs: Not on file  Physical Activity: Not on file  Stress: Not on file  Social Connections: Not on file  Intimate Partner Violence: Not on file      Review of Systems  Constitutional: Negative.   All other systems reviewed and are negative.      Objective:   Physical Exam Vitals reviewed.  Constitutional:      General: She is not in acute distress.    Appearance: Normal appearance. She is well-developed. She is obese. She is not diaphoretic.  HENT:     Head: Normocephalic and atraumatic.     Right Ear: Tympanic membrane and ear canal normal.     Left Ear: Tympanic membrane and ear canal normal.     Nose: Nose normal. No  congestion or rhinorrhea.     Mouth/Throat:     Mouth: Mucous membranes are moist.     Pharynx: No oropharyngeal exudate or posterior oropharyngeal erythema.  Eyes:     Extraocular Movements: Extraocular movements intact.     Pupils: Pupils are equal, round, and reactive to light.  Neck:     Vascular: No carotid bruit.  Cardiovascular:     Rate and Rhythm:  Normal rate and regular rhythm.     Pulses: Normal pulses.     Heart sounds: Normal heart sounds. No murmur heard.    No friction rub.  Pulmonary:     Effort: Pulmonary effort is normal. No respiratory distress.     Breath sounds: Normal breath sounds. No stridor. No wheezing.  Abdominal:     General: Abdomen is flat. Bowel sounds are normal. There is no distension.     Palpations: Abdomen is soft.     Tenderness: There is no abdominal tenderness. There is no guarding or rebound.  Musculoskeletal:     Cervical back: Normal range of motion and neck supple. No rigidity or tenderness.     Right lower leg: No edema.     Left lower leg: No edema.  Lymphadenopathy:     Cervical: No cervical adenopathy.  Skin:    Findings: No erythema or rash.  Neurological:     General: No focal deficit present.     Mental Status: She is alert and oriented to person, place, and time.  Psychiatric:        Mood and Affect: Mood normal.        Behavior: Behavior normal.        Thought Content: Thought content normal.        Judgment: Judgment normal.           Assessment & Plan:  General medical exam  Controlled type 2 diabetes mellitus without complication, without long-term current use of insulin ([place]) - Plan: Lipid panel, Protein / Creatinine Ratio, Urine Is that she has been take less medication for several months due to flushing after eating that she was having.  Therefore I will check her cholesterol.  Given her diabetes I would like to see her LDL cholesterol well below 100.  Her A1c is elevated so we will start her on Ozempic 0.5 mg  subcu weekly and uptitrate as tolerated to the maximum tolerated dose.  Hopefully she will be able to tolerate 2 mg subcu weekly.  I believe with weight loss, we can eventually transition her away from metformin she will overall feel better.  Her blood pressure is acceptable.  Check a urine protein to creatinine ratio.  Cancer screening is being performed to her gynecologist and is up-to-date.

## 2022-07-22 ENCOUNTER — Other Ambulatory Visit: Payer: Self-pay

## 2022-07-22 ENCOUNTER — Telehealth: Payer: BC Managed Care – PPO | Admitting: Physician Assistant

## 2022-07-22 DIAGNOSIS — R6889 Other general symptoms and signs: Secondary | ICD-10-CM | POA: Diagnosis not present

## 2022-07-22 DIAGNOSIS — E7800 Pure hypercholesterolemia, unspecified: Secondary | ICD-10-CM

## 2022-07-22 LAB — LIPID PANEL
Cholesterol: 198 mg/dL (ref <200)
HDL: 48 mg/dL — ABNORMAL LOW (ref >=50)
LDL Cholesterol (Calc): 124 mg/dL (calc) — ABNORMAL HIGH
Non-HDL Cholesterol (Calc): 150 mg/dL (calc) — ABNORMAL HIGH (ref <130)
Total CHOL/HDL Ratio: 4.1 (calc) (ref <5.0)
Triglycerides: 148 mg/dL (ref <150)

## 2022-07-22 LAB — PROTEIN / CREATININE RATIO, URINE
Creatinine, Urine: 245 mg/dL (ref 20–275)
Protein/Creat Ratio: 171 mg/g creat (ref 24–184)
Protein/Creatinine Ratio: 0.171 mg/mg creat (ref 0.024–0.184)
Total Protein, Urine: 42 mg/dL — ABNORMAL HIGH (ref 5–24)

## 2022-07-22 MED ORDER — OSELTAMIVIR PHOSPHATE 75 MG PO CAPS: 75.0000 mg | ORAL_CAPSULE | Freq: Two times a day (BID) | ORAL | 0 refills | Status: DC

## 2022-07-22 MED ORDER — ROSUVASTATIN CALCIUM 10 MG PO TABS: 10.0000 mg | ORAL_TABLET | Freq: Every day | ORAL | 3 refills | Status: DC

## 2022-07-22 NOTE — Progress Notes (Signed)
Virtual Visit Consent   [place] Candice Jones, you are scheduled for a virtual visit with a [name] provider today. Just as with appointments in the office, your consent must be obtained to participate. Your consent will be active for this visit and any virtual visit you may have with one of our providers in the next 365 days. If you have a MyChart account, a copy of this consent can be sent to you electronically.  As this is a virtual visit, video technology does not allow for your provider to perform a traditional examination. This may limit your provider's ability to fully assess your condition. If your provider identifies any concerns that need to be evaluated in person or the need to arrange testing (such as labs, EKG, etc.), we will make arrangements to do so. Although advances in technology are sophisticated, we cannot ensure that it will always work on either your end or our end. If the connection with a video visit is poor, the visit may have to be switched to a telephone visit. With either a video or telephone visit, we are not always able to ensure that we have a secure connection.  By engaging in this virtual visit, you consent to the provision of healthcare and authorize for your insurance to be billed (if applicable) for the services provided during this visit. Depending on your insurance coverage, you may receive a charge related to this service.  I need to obtain your verbal consent now. Are you willing to proceed with your visit today? [place] Candice Jones has provided verbal consent on 07/22/2022 for a virtual visit (video or telephone). Mar Daring, PA-C  Date: 07/22/2022 6:14 PM  Virtual Visit via Video Note   I, Mar Daring, connected with  Candice Jones  ([id number], 06-28-1976) on 07/22/22 at  6:15 PM EST by a video-enabled telemedicine application and verified that I am speaking with the correct person using two identifiers.  Location: Patient: Virtual Visit Location Patient:  Home Provider: Virtual Visit Location Provider: Home Office   I discussed the limitations of evaluation and management by telemedicine and the availability of in person appointments. The patient expressed understanding and agreed to proceed.    History of Present Illness: Candice Jones is a 46 y.o. who identifies as a female who was assigned female at birth, and is being seen today for possible flu.  HPI: URI  This is a new problem. The current episode started today. The problem has been gradually worsening. There has been no fever. Associated symptoms include congestion, coughing, diarrhea (but just had metformin increased), headaches, rhinorrhea and sinus pain. Pertinent negatives include no ear pain, nausea, plugged ear sensation, sore throat or vomiting. Associated symptoms comments: Fatigue, does have Meniere's right ear and having some increase ringing today and having some mild dizziness, legs have been aching and feeling weird today.    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  Problems:  Patient Active Problem List   Diagnosis Date Noted   Family history of colon cancer - sister < 60 02/02/2017   Internal hemorrhoids with bleeding and prolapse 06/24/2014   IBS (irritable bowel syndrome) 06/24/2014   Endometriosis 03/03/2013   Elevated fasting glucose 03/01/2013   Gallstones 05/12/2011    Allergies:  Allergies  Allergen Reactions   Augmentin [Amoxicillin-Pot Clavulanate] Nausea And Vomiting    Severe nausea/vomiting    Codeine Other (See Comments)  Makes pain worse   Ciprofloxacin Hcl Rash   Erythromycin Rash   Sulfa Antibiotics Rash   Medications:  Current Outpatient Medications:    oseltamivir (TAMIFLU) 75 MG capsule, Take 1 capsule (75 mg total) by mouth 2 (two) times daily., Disp: 10 capsule, Rfl: 0   albuterol (VENTOLIN HFA) 108 (90 Base) MCG/ACT inhaler, Inhale 2 puffs into the lungs every 4 (four)  hours as needed for wheezing or shortness of breath., Disp: 18 g, Rfl: 0   APPLE CIDER VINEGAR PO, Take by mouth. Gummies, includes b6 and b12. Has been taking x4 months., Disp: , Rfl:    Ascorbic Acid (VITAMIN C PO), Take 1 tablet by mouth every morning., Disp: , Rfl:    Azelastine HCl 137 MCG/SPRAY SOLN, Place 2 sprays into both nostrils every morning., Disp: , Rfl:    Blood Glucose Monitoring Suppl KIT, Use to check fasting glucose daily ICD10: R73.01.Dispense based on insurance preference, Disp: 1 kit, Rfl: 0   Cholecalciferol (VITAMIN D3 PO), Take 1 tablet by mouth every morning., Disp: , Rfl:    Cranberry-Vitamin C-Vitamin E (CRANBERRY PLUS VITAMIN C) 4200-20-3 MG-MG-UNIT CAPS, Take 2 capsules by mouth every morning., Disp: , Rfl:    docusate sodium (COLACE) 100 MG capsule, Take 100 mg by mouth every morning., Disp: , Rfl:    Fluocinolone Acetonide Body (DERMA-SMOOTHE/FS BODY) 0.01 % OIL, Apply 1 Application topically 2 (two) times a week., Disp: 120 mL, Rfl: 1   fluticasone (FLONASE) 50 MCG/ACT nasal spray, SPRAY 2 SPRAYS INTO EACH NOSTRIL EVERY DAY (Patient taking differently: Place 2 sprays into both nostrils daily as needed for allergies or rhinitis.), Disp: 48 mL, Rfl: 1   glucose blood test strip, Use with glucose meter to check blood sugar.ICD10: R73.01.Dispense based on insurance preference, Disp: 100 each, Rfl: 12   Lancets (FREESTYLE) lancets, Use to check blood sugar ICD10: R73.01.Dispense based on insurance preference, Disp: 100 each, Rfl: 12   levocetirizine (XYZAL) 5 MG tablet, Take 5 mg by mouth every morning., Disp: , Rfl:    losartan (COZAAR) 25 MG tablet, TAKE 1 TABLET (25 MG TOTAL) BY MOUTH DAILY., Disp: 90 tablet, Rfl: 3   metFORMIN (GLUCOPHAGE-XR) 500 MG 24 hr tablet, TAKE 2 TABLETS BY MOUTH EVERY DAY WITH BREAKFAST, Disp: 180 tablet, Rfl: 3   Misc Natural Products (ELDERBERRY ZINC/VIT C/IMMUNE MT), Use as directed in the mouth or throat., Disp: , Rfl:    Multiple Vitamin  (MULTIVITAMIN WITH MINERALS) TABS tablet, Take 1 tablet by mouth every morning., Disp: , Rfl:    Multiple Vitamins-Minerals (IMMUNE SUPPORT) CHEW, Chew 2 tablets by mouth every morning., Disp: , Rfl:    norethindrone-ethinyl estradiol (LOESTRIN) 1-20 MG-MCG tablet, Take 1 tablet by mouth every morning., Disp: , Rfl:    pantoprazole (PROTONIX) 40 MG tablet, TAKE 1 TABLET BY MOUTH TWICE A DAY, Disp: 180 tablet, Rfl: 1   PEPPERMINT OIL PO, Take 1 capsule by mouth every morning., Disp: , Rfl:    rosuvastatin (CRESTOR) 10 MG tablet, Take 1 tablet (10 mg total) by mouth daily., Disp: 90 tablet, Rfl: 3   Semaglutide,0.25 or 0.5MG/DOS, (OZEMPIC, 0.25 OR 0.5 MG/DOSE,) 2 MG/3ML SOPN, Inject 0.5 mg into the skin once a week., Disp: 3 mL, Rfl: 3   triamterene-hydrochlorothiazide (MAXZIDE-25) 37.5-25 MG tablet, Take 1 tablet by mouth every morning., Disp: , Rfl:    VITAMIN A PO, Take 1 capsule by mouth every morning., Disp: , Rfl:   Observations/Objective: Patient is well-developed, well-nourished in no acute  distress.  Resting comfortably at home.  Head is normocephalic, atraumatic.  No labored breathing.  Speech is clear and coherent with logical content.  Patient is alert and oriented at baseline.    Assessment and Plan: 1. Flu-like symptoms - oseltamivir (TAMIFLU) 75 MG capsule; Take 1 capsule (75 mg total) by mouth 2 (two) times daily.  Dispense: 10 capsule; Refill: 0  - Suspected viral URI with negative covid testing - Possible flu as flu A and B are prevalent in the community at this time - Limited testing availability that would delay appropriate treatment waiting on results - Tamiflu prescribed for possible flu - Push fluids - Symptomatic management OTC of choice as needed - Seek in person evaluation if symptoms worsen or fail to improve   Follow Up Instructions: I discussed the assessment and treatment plan with the patient. The patient was provided an opportunity to ask questions and  all were answered. The patient agreed with the plan and demonstrated an understanding of the instructions.  A copy of instructions were sent to the patient via MyChart unless otherwise noted below.    The patient was advised to call back or seek an in-person evaluation if the symptoms worsen or if the condition fails to improve as anticipated.  Time:  I spent 10 minutes with the patient via telehealth technology discussing the above problems/concerns.    Mar Daring, PA-C

## 2022-07-22 NOTE — Patient Instructions (Signed)
Candice Jones, thank you for joining Mar Daring, PA-C for today's virtual visit.  While this provider is not your primary care provider (PCP), if your PCP is located in our provider database this encounter information will be shared with them immediately following your visit.   Candice Jones account gives you access to today's visit and all your visits, tests, and labs performed at [facility] " click here if you don't have a Candice Jones account or go to [email or web address]  Consent: (Patient) Candice Jones provided verbal consent for this virtual visit at the beginning of the encounter.  Current Medications:  Current Outpatient Medications:    oseltamivir (TAMIFLU) 75 MG capsule, Take 1 capsule (75 mg total) by mouth 2 (two) times daily., Disp: 10 capsule, Rfl: 0   albuterol (VENTOLIN HFA) 108 (90 Base) MCG/ACT inhaler, Inhale 2 puffs into the lungs every 4 (four) hours as needed for wheezing or shortness of breath., Disp: 18 g, Rfl: 0   APPLE CIDER VINEGAR PO, Take by mouth. Gummies, includes b6 and b12. Has been taking x4 months., Disp: , Rfl:    Ascorbic Acid (VITAMIN C PO), Take 1 tablet by mouth every morning., Disp: , Rfl:    Azelastine HCl 137 MCG/SPRAY SOLN, Place 2 sprays into both nostrils every morning., Disp: , Rfl:    Blood Glucose Monitoring Suppl KIT, Use to check fasting glucose daily ICD10: R73.01.Dispense based on insurance preference, Disp: 1 kit, Rfl: 0   Cholecalciferol (VITAMIN D3 PO), Take 1 tablet by mouth every morning., Disp: , Rfl:    Cranberry-Vitamin C-Vitamin E (CRANBERRY PLUS VITAMIN C) 4200-20-3 MG-MG-UNIT CAPS, Take 2 capsules by mouth every morning., Disp: , Rfl:    docusate sodium (COLACE) 100 MG capsule, Take 100 mg by mouth every morning., Disp: , Rfl:    Fluocinolone Acetonide Body (DERMA-SMOOTHE/FS BODY) 0.01 % OIL, Apply 1 Application topically 2 (two) times a week., Disp: 120 mL, Rfl: 1   fluticasone (FLONASE) 50  MCG/ACT nasal spray, SPRAY 2 SPRAYS INTO EACH NOSTRIL EVERY DAY (Patient taking differently: Place 2 sprays into both nostrils daily as needed for allergies or rhinitis.), Disp: 48 mL, Rfl: 1   glucose blood test strip, Use with glucose meter to check blood sugar.ICD10: R73.01.Dispense based on insurance preference, Disp: 100 each, Rfl: 12   Lancets (FREESTYLE) lancets, Use to check blood sugar ICD10: R73.01.Dispense based on insurance preference, Disp: 100 each, Rfl: 12   levocetirizine (XYZAL) 5 MG tablet, Take 5 mg by mouth every morning., Disp: , Rfl:    losartan (COZAAR) 25 MG tablet, TAKE 1 TABLET (25 MG TOTAL) BY MOUTH DAILY., Disp: 90 tablet, Rfl: 3   metFORMIN (GLUCOPHAGE-XR) 500 MG 24 hr tablet, TAKE 2 TABLETS BY MOUTH EVERY DAY WITH BREAKFAST, Disp: 180 tablet, Rfl: 3   Misc Natural Products (ELDERBERRY ZINC/VIT C/IMMUNE MT), Use as directed in the mouth or throat., Disp: , Rfl:    Multiple Vitamin (MULTIVITAMIN WITH MINERALS) TABS tablet, Take 1 tablet by mouth every morning., Disp: , Rfl:    Multiple Vitamins-Minerals (IMMUNE SUPPORT) CHEW, Chew 2 tablets by mouth every morning., Disp: , Rfl:    norethindrone-ethinyl estradiol (LOESTRIN) 1-20 MG-MCG tablet, Take 1 tablet by mouth every morning., Disp: , Rfl:    pantoprazole (PROTONIX) 40 MG tablet, TAKE 1 TABLET BY MOUTH TWICE A DAY, Disp: 180 tablet, Rfl: 1   PEPPERMINT OIL PO, Take 1 capsule by mouth every morning., Disp: , Rfl:    rosuvastatin (  CRESTOR) 10 MG tablet, Take 1 tablet (10 mg total) by mouth daily., Disp: 90 tablet, Rfl: 3   Semaglutide,0.25 or 0.5MG/DOS, (OZEMPIC, 0.25 OR 0.5 MG/DOSE,) 2 MG/3ML SOPN, Inject 0.5 mg into the skin once a week., Disp: 3 mL, Rfl: 3   triamterene-hydrochlorothiazide (MAXZIDE-25) 37.5-25 MG tablet, Take 1 tablet by mouth every morning., Disp: , Rfl:    VITAMIN A PO, Take 1 capsule by mouth every morning., Disp: , Rfl:    Medications ordered in this encounter:  Meds ordered this encounter   Medications   oseltamivir (TAMIFLU) 75 MG capsule    Sig: Take 1 capsule (75 mg total) by mouth 2 (two) times daily.    Dispense:  10 capsule    Refill:  0    Order Specific Question:   Supervising Provider    Answer:   Chase Picket [id number]     *If you need refills on other medications prior to your next appointment, please contact your pharmacy*  Follow-Up: Call back or seek an in-person evaluation if the symptoms worsen or if the condition fails to improve as anticipated.  [place] [phone]  Other Instructions Influenza, Adult Influenza, also called "the flu," is a viral infection that mainly affects the respiratory tract. This includes the lungs, nose, and throat. The flu spreads easily from person to person (is contagious). It causes common cold symptoms, along with high fever and body aches. What are the causes? This condition is caused by the influenza virus. You can get the virus by: Breathing in droplets that are in the air from an infected person's cough or sneeze. Touching something that has the virus on it (has been contaminated) and then touching your mouth, nose, or eyes. What increases the risk? The following factors may make you more likely to get the flu: Not washing or sanitizing your hands often. Having close contact with many people during cold and flu season. Touching your mouth, eyes, or nose without first washing or sanitizing your hands. Not getting an annual flu shot. You may have a higher risk for the flu, including serious problems, such as a lung infection (pneumonia), if you: Are older than 65. Are pregnant. Have a weakened disease-fighting system (immune system). This includes people who have HIV or AIDS, are on chemotherapy, or are taking medicines that reduce (suppress) the immune system. Have a long-term (chronic) illness, such as heart disease, kidney disease, diabetes, or lung disease. Have a liver disorder. Are  severely overweight (morbidly obese). Have anemia. Have asthma. What are the signs or symptoms? Symptoms of this condition usually begin suddenly and last 4-14 days. These may include: Fever and chills. Headaches, body aches, or muscle aches. Sore throat. Cough. Runny or stuffy (congested) nose. Chest discomfort. Poor appetite. Weakness or fatigue. Dizziness. Nausea or vomiting. How is this diagnosed? This condition may be diagnosed based on: Your symptoms and medical history. A physical exam. Swabbing your nose or throat and testing the fluid for the influenza virus. How is this treated? If the flu is diagnosed early, you can be treated with antiviral medicine that is given by mouth (orally) or through an IV. This can help reduce how severe the illness is and how long it lasts. Taking care of yourself at home can help relieve symptoms. Your health care provider may recommend: Taking over-the-counter medicines. Drinking plenty of fluids. In many cases, the flu goes away on its own. If you have severe symptoms or complications, you may be  treated in a hospital. Follow these instructions at home: Activity Rest as needed and get plenty of sleep. Stay home from work or school as told by your health care provider. Unless you are visiting your health care provider, avoid leaving home until your fever has been gone for 24 hours without taking medicine. Eating and drinking Take an oral rehydration solution (ORS). This is a drink that is sold at pharmacies and retail stores. Drink enough fluid to keep your urine pale yellow. Drink clear fluids in small amounts as you are able. Clear fluids include water, ice chips, fruit juice mixed with water, and low-calorie sports drinks. Eat bland, easy-to-digest foods in small amounts as you are able. These foods include bananas, applesauce, rice, Candice meats, toast, and crackers. Avoid drinking fluids that contain a lot of sugar or caffeine, such as  energy drinks, regular sports drinks, and soda. Avoid alcohol. Avoid spicy or fatty foods. General instructions     Take over-the-counter and prescription medicines only as told by your health care provider. Use a cool mist humidifier to add humidity to the air in your home. This can make it easier to breathe. When using a cool mist humidifier, clean it daily. Empty the water and replace it with clean water. Cover your mouth and nose when you cough or sneeze. Wash your hands with soap and water often and for at least 20 seconds, especially after you cough or sneeze. If soap and water are not available, use alcohol-based hand sanitizer. Keep all follow-up visits. This is important. How is this prevented?  Get an annual flu shot. This is usually available in late summer, fall, or winter. Ask your health care provider when you should get your flu shot. Avoid contact with people who are sick during cold and flu season. This is generally fall and winter. Contact a health care provider if: You develop new symptoms. You have: Chest pain. Diarrhea. A fever. Your cough gets worse. You produce more mucus. You feel nauseous or you vomit. Get help right away if you: Develop shortness of breath or have difficulty breathing. Have skin or nails that turn a bluish color. Have severe pain or stiffness in your neck. Develop a sudden headache or sudden pain in your face or ear. Cannot eat or drink without vomiting. These symptoms may represent a serious problem that is an emergency. Do not wait to see if the symptoms will go away. Get medical help right away. Call your local emergency services (911 in the [place] Do not drive yourself to the hospital. Summary Influenza, also called "the flu," is a viral infection that primarily affects your respiratory tract. Symptoms of the flu usually begin suddenly and last 4-14 days. Getting an annual flu shot is the best way to prevent getting the flu. Stay  home from work or school as told by your health care provider. Unless you are visiting your health care provider, avoid leaving home until your fever has been gone for 24 hours without taking medicine. Keep all follow-up visits. This is important. This information is not intended to replace advice given to you by your health care provider. Make sure you discuss any questions you have with your health care provider. Document Revised: 03/06/2020 Document Reviewed: 03/06/2020 Elsevier Patient Education  [place].    If you have been instructed to have an in-person evaluation today at a local Urgent Care facility, please use the link below. It will take you to a list of all of  our available [facility], including address, phone number and hours of operation. Please do not delay care.  [facility]  If you or a family member do not have a primary care provider, use the link below to schedule a visit and establish care. When you choose a [facility] primary care physician or advanced practice provider, you gain a long-term partner in health. Find a Primary Care Provider  Learn more about 's in-office and virtual care options: [facility]

## 2022-08-16 ENCOUNTER — Encounter: Payer: Self-pay | Admitting: Family Medicine

## 2022-08-18 ENCOUNTER — Telehealth: Payer: Self-pay

## 2022-08-18 ENCOUNTER — Other Ambulatory Visit: Payer: Self-pay

## 2022-08-18 DIAGNOSIS — E1165 Type 2 diabetes mellitus with hyperglycemia: Secondary | ICD-10-CM

## 2022-08-18 MED ORDER — SEMAGLUTIDE (1 MG/DOSE) 4 MG/3ML ~~LOC~~ SOPN: 1.0000 mg | PEN_INJECTOR | SUBCUTANEOUS | 1 refills | Status: DC

## 2022-08-18 NOTE — Telephone Encounter (Signed)
My Chart message from patient:  I'm seeing coverage about the state health plan no longer covering new prescriptions of Ozempic, would that include the increase dose for me?  It's been a month can we go ahead and call in the higher dose one or should we look at something else? I am also going to be covered under [employer] plan this year as well which could help...  I advised pt that hopefully since she has been on the Ozempic, they will continue to cover.  Ok to increase dose? Thanks.

## 2022-08-21 ENCOUNTER — Other Ambulatory Visit: Payer: Self-pay | Admitting: Family Medicine

## 2022-08-21 DIAGNOSIS — E1165 Type 2 diabetes mellitus with hyperglycemia: Secondary | ICD-10-CM

## 2022-08-29 ENCOUNTER — Encounter: Payer: Self-pay | Admitting: Family Medicine

## 2022-08-29 ENCOUNTER — Ambulatory Visit (INDEPENDENT_AMBULATORY_CARE_PROVIDER_SITE_OTHER): Payer: BC Managed Care – PPO | Admitting: Family Medicine

## 2022-08-29 VITALS — BP 112/80 | HR 110 | Temp 97.9°F | Ht 64.0 in | Wt 197.0 lb

## 2022-08-29 DIAGNOSIS — J029 Acute pharyngitis, unspecified: Secondary | ICD-10-CM

## 2022-08-29 DIAGNOSIS — J069 Acute upper respiratory infection, unspecified: Secondary | ICD-10-CM | POA: Diagnosis not present

## 2022-08-29 NOTE — Progress Notes (Signed)
Acute Office Visit  Subjective:     Patient ID: Candice Jones, female    DOB: [birth year], 47 y.o.   MRN: [id number]  Chief Complaint  Patient presents with   Acute Visit    Sore throat/ear aches    HPI Patient is in today for 2 days of sore throat, low-grade fever, ear pressure, postnasal drip, body aches, and congestion. 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 Has tried nothing.  Review of Systems  All other systems reviewed and are negative.   Past Medical History:  Diagnosis Date   Adenomyosis    Asthma    COVID-19    Diabetes (HCC)    Gall stones    Gestational diabetes mellitus    Hiatal hernia    Hyperlipidemia    IBS (irritable bowel syndrome)    Internal hemorrhoids    Kidney stones    Meniere's syndrome    Vertigo    Past Surgical History:  Procedure Laterality Date   CESAREAN SECTION  08/1999, 05/2003   CHOLECYSTECTOMY  2011   CYSTECTOMY  1997   left wrist    ENDOMETRIAL ABLATION  01/2010   HEMORRHOID BANDING  2015   MINOR HEMORRHOIDECTOMY  04/2014   thrombosed   WISDOM TOOTH EXTRACTION  1996/1997   Current Outpatient Medications on File Prior to Visit  Medication Sig Dispense Refill   albuterol (VENTOLIN HFA) 108 (90 Base) MCG/ACT inhaler Inhale 2 puffs into the lungs every 4 (four) hours as needed for wheezing or shortness of breath. 18 g 0   APPLE CIDER VINEGAR PO Take by mouth. Gummies, includes b6 and b12. Has been taking x4 months.     Ascorbic Acid (VITAMIN C PO) Take 1 tablet by mouth every morning.     Azelastine HCl 137 MCG/SPRAY SOLN Place 2 sprays into both nostrils every morning.     Blood Glucose Monitoring Suppl KIT Use to check fasting glucose daily ICD10: R73.01.Dispense based on insurance preference 1 kit 0   Cholecalciferol (VITAMIN D3 PO) Take 1 tablet by mouth every morning.     Cranberry-Vitamin C-Vitamin E (CRANBERRY PLUS VITAMIN C)  4200-20-3 MG-MG-UNIT CAPS Take 2 capsules by mouth every morning.     docusate sodium (COLACE) 100 MG capsule Take 100 mg by mouth every morning.     fluticasone (FLONASE) 50 MCG/ACT nasal spray SPRAY 2 SPRAYS INTO EACH NOSTRIL EVERY DAY (Patient taking differently: Place 2 sprays into both nostrils daily as needed for allergies or rhinitis.) 48 mL 1   glucose blood test strip Use with glucose meter to check blood sugar.ICD10: R73.01.Dispense based on insurance preference 100 each 12   Lancets (FREESTYLE) lancets Use to check blood sugar ICD10: R73.01.Dispense based on insurance preference 100 each 12   levocetirizine (XYZAL) 5 MG tablet Take 5 mg by mouth every morning.     losartan (COZAAR) 25 MG tablet TAKE 1 TABLET (25 MG TOTAL) BY MOUTH DAILY. 90 tablet 3   metFORMIN (GLUCOPHAGE-XR) 500 MG 24 hr tablet TAKE 2 TABLETS BY MOUTH EVERY DAY WITH BREAKFAST (Patient taking differently: Per pt, stated that per Dr. Tanya Nones, pt to take 4 every day in the morning w/breakfast) 180 tablet 3   Misc Natural Products (ELDERBERRY ZINC/VIT C/IMMUNE MT) Use as directed in the mouth or throat.     Multiple Vitamin (MULTIVITAMIN WITH MINERALS) TABS tablet Take 1 tablet by mouth every morning.  Multiple Vitamins-Minerals (IMMUNE SUPPORT) CHEW Chew 2 tablets by mouth every morning.     norethindrone-ethinyl estradiol (LOESTRIN) 1-20 MG-MCG tablet Take 1 tablet by mouth every morning.     oseltamivir (TAMIFLU) 75 MG capsule Take 1 capsule (75 mg total) by mouth 2 (two) times daily. 10 capsule 0   pantoprazole (PROTONIX) 40 MG tablet TAKE 1 TABLET BY MOUTH TWICE A DAY 180 tablet 1   PEPPERMINT OIL PO Take 1 capsule by mouth every morning.     rosuvastatin (CRESTOR) 10 MG tablet Take 1 tablet (10 mg total) by mouth daily. 90 tablet 3   Semaglutide, 1 MG/DOSE, 4 MG/3ML SOPN Inject 1 mg as directed once a week. 3 mL 1   triamterene-hydrochlorothiazide (MAXZIDE-25) 37.5-25 MG tablet Take 1 tablet by mouth every morning.      VITAMIN A PO Take 1 capsule by mouth every morning.     Fluocinolone Acetonide Body (DERMA-SMOOTHE/FS BODY) 0.01 % OIL Apply 1 Application topically 2 (two) times a week. (Patient not taking: Reported on 08/29/2022) 120 mL 1   No current facility-administered medications on file prior to visit.   Allergies  Allergen Reactions   Augmentin [Amoxicillin-Pot Clavulanate] Nausea And Vomiting    Severe nausea/vomiting    Codeine Other (See Comments)    Makes pain worse   Ciprofloxacin Hcl Rash   Erythromycin Rash   Sulfa Antibiotics Rash        Objective:    BP 112/80   Pulse (!) 110   Temp 97.9 F (36.6 C) (Oral)   Ht 5\' 4"  (1.626 m)   Wt 197 lb (89.4 kg)   SpO2 96%   BMI 33.81 kg/m    Physical Exam Vitals and nursing note reviewed.  Constitutional:      Appearance: Normal appearance. She is normal weight.  HENT:     Head: Normocephalic and atraumatic.     Right Ear: Tympanic membrane, ear canal and external ear normal.     Left Ear: Tympanic membrane, ear canal and external ear normal.     Nose: Nose normal.     Mouth/Throat:     Mouth: Mucous membranes are moist.     Pharynx: Oropharynx is clear.  Eyes:     Conjunctiva/sclera: Conjunctivae normal.  Cardiovascular:     Rate and Rhythm: Normal rate and regular rhythm.     Pulses: Normal pulses.     Heart sounds: Normal heart sounds.  Pulmonary:     Effort: Pulmonary effort is normal.     Breath sounds: Normal breath sounds.  Musculoskeletal:     Cervical back: No tenderness.  Lymphadenopathy:     Cervical: No cervical adenopathy.  Skin:    General: Skin is warm and dry.  Neurological:     General: No focal deficit present.     Mental Status: She is alert and oriented to person, place, and time. Mental status is at baseline.  Psychiatric:        Mood and Affect: Mood normal.        Behavior: Behavior normal.        Thought Content: Thought content normal.        Judgment: Judgment normal.     No  results found for any visits on 08/29/22.      Assessment & Plan:   Problem List Items Addressed This Visit       Respiratory   Viral URI - Primary    Reassured patient that symptoms and exam findings are  most consistent with a viral upper respiratory infection and explained lack of efficacy of antibiotics against viruses.  Discussed expected course and features suggestive of secondary bacterial infection.  Continue supportive care. Increase fluid intake with water or electrolyte solution like pedialyte. Encouraged acetaminophen as needed for fever/pain. Encouraged salt water gargling, chloraseptic spray and throat lozenges. Encouraged OTC guaifenesin. Encouraged saline sinus flushes and/or neti with humidified air.         No orders of the defined types were placed in this encounter.   Return if symptoms worsen or fail to improve.  Rubie Maid, FNP

## 2022-08-29 NOTE — Addendum Note (Signed)
Addended by: Rubie Maid on: 08/29/2022 03:42 PM   Modules accepted: Orders

## 2022-08-29 NOTE — Assessment & Plan Note (Signed)

## 2022-08-29 NOTE — Patient Instructions (Signed)

## 2022-08-30 LAB — SARS-COV-2 RNA, INFLUENZA A/B, AND RSV RNA, QUALITATIVE NAAT
INFLUENZA A RNA: NOT DETECTED
INFLUENZA B RNA: NOT DETECTED
RSV RNA: NOT DETECTED
SARS COV2 RNA: NOT DETECTED

## 2022-08-31 ENCOUNTER — Telehealth: Payer: BC Managed Care – PPO | Admitting: Physician Assistant

## 2022-08-31 ENCOUNTER — Encounter: Payer: Self-pay | Admitting: Family Medicine

## 2022-08-31 DIAGNOSIS — H6993 Unspecified Eustachian tube disorder, bilateral: Secondary | ICD-10-CM

## 2022-08-31 DIAGNOSIS — J019 Acute sinusitis, unspecified: Secondary | ICD-10-CM

## 2022-08-31 DIAGNOSIS — B9689 Other specified bacterial agents as the cause of diseases classified elsewhere: Secondary | ICD-10-CM | POA: Diagnosis not present

## 2022-08-31 LAB — CULTURE, GROUP A STREP
MICRO NUMBER:: 14486083
SPECIMEN QUALITY:: ADEQUATE

## 2022-08-31 LAB — STREP GROUP A AG, W/REFLEX TO CULT: Streptococcus Group A AG: NOT DETECTED

## 2022-08-31 MED ORDER — BENZONATATE 100 MG PO CAPS: 100.0000 mg | ORAL_CAPSULE | Freq: Three times a day (TID) | ORAL | 0 refills | Status: DC | PRN

## 2022-08-31 MED ORDER — DOXYCYCLINE HYCLATE 100 MG PO TABS: 100.0000 mg | ORAL_TABLET | Freq: Two times a day (BID) | ORAL | 0 refills | Status: DC

## 2022-08-31 NOTE — Patient Instructions (Signed)
Jean Holly Bodily, thank you for joining [name], PA-C for today's virtual visit.  While this provider is not your primary care provider (PCP), if your PCP is located in our provider database this encounter information will be shared with them immediately following your visit.   [facility] account gives you access to today's visit and all your visits, tests, and labs performed at [facility] " click here if you don't have a [facility] account or go to [email or web address]  Consent: (Patient) Cambrie DAWANDA MAPEL provided verbal consent for this virtual visit at the beginning of the encounter.  Current Medications:  Current Outpatient Medications:    albuterol (VENTOLIN HFA) 108 (90 Base) MCG/ACT inhaler, Inhale 2 puffs into the lungs every 4 (four) hours as needed for wheezing or shortness of breath., Disp: 18 g, Rfl: 0   APPLE CIDER VINEGAR PO, Take by mouth. Gummies, includes b6 and b12. Has been taking x4 months., Disp: , Rfl:    Ascorbic Acid (VITAMIN C PO), Take 1 tablet by mouth every morning., Disp: , Rfl:    Azelastine HCl 137 MCG/SPRAY SOLN, Place 2 sprays into both nostrils every morning., Disp: , Rfl:    Blood Glucose Monitoring Suppl KIT, Use to check fasting glucose daily ICD10: R73.01.Dispense based on insurance preference, Disp: 1 kit, Rfl: 0   Cholecalciferol (VITAMIN D3 PO), Take 1 tablet by mouth every morning., Disp: , Rfl:    Cranberry-Vitamin C-Vitamin E (CRANBERRY PLUS VITAMIN C) 4200-20-3 MG-MG-UNIT CAPS, Take 2 capsules by mouth every morning., Disp: , Rfl:    docusate sodium (COLACE) 100 MG capsule, Take 100 mg by mouth every morning., Disp: , Rfl:    Fluocinolone Acetonide Body (DERMA-SMOOTHE/FS BODY) 0.01 % OIL, Apply 1 Application topically 2 (two) times a week. (Patient not taking: Reported on 08/29/2022), Disp: 120 mL, Rfl: 1   fluticasone (FLONASE) 50 MCG/ACT nasal spray, SPRAY 2 SPRAYS INTO EACH NOSTRIL EVERY DAY (Patient taking  differently: Place 2 sprays into both nostrils daily as needed for allergies or rhinitis.), Disp: 48 mL, Rfl: 1   glucose blood test strip, Use with glucose meter to check blood sugar.ICD10: R73.01.Dispense based on insurance preference, Disp: 100 each, Rfl: 12   Lancets (FREESTYLE) lancets, Use to check blood sugar ICD10: R73.01.Dispense based on insurance preference, Disp: 100 each, Rfl: 12   levocetirizine (XYZAL) 5 MG tablet, Take 5 mg by mouth every morning., Disp: , Rfl:    losartan (COZAAR) 25 MG tablet, TAKE 1 TABLET (25 MG TOTAL) BY MOUTH DAILY., Disp: 90 tablet, Rfl: 3   metFORMIN (GLUCOPHAGE-XR) 500 MG 24 hr tablet, TAKE 2 TABLETS BY MOUTH EVERY DAY WITH BREAKFAST (Patient taking differently: Per pt, stated that per Dr. Dennard Schaumann, pt to take 4 every day in the morning w/breakfast), Disp: 180 tablet, Rfl: 3   Misc Natural Products (ELDERBERRY ZINC/VIT C/IMMUNE MT), Use as directed in the mouth or throat., Disp: , Rfl:    Multiple Vitamin (MULTIVITAMIN WITH MINERALS) TABS tablet, Take 1 tablet by mouth every morning., Disp: , Rfl:    Multiple Vitamins-Minerals (IMMUNE SUPPORT) CHEW, Chew 2 tablets by mouth every morning., Disp: , Rfl:    norethindrone-ethinyl estradiol (LOESTRIN) 1-20 MG-MCG tablet, Take 1 tablet by mouth every morning., Disp: , Rfl:    oseltamivir (TAMIFLU) 75 MG capsule, Take 1 capsule (75 mg total) by mouth 2 (two) times daily., Disp: 10 capsule, Rfl: 0   pantoprazole (PROTONIX) 40 MG tablet, TAKE 1 TABLET BY MOUTH TWICE  A DAY, Disp: 180 tablet, Rfl: 1   PEPPERMINT OIL PO, Take 1 capsule by mouth every morning., Disp: , Rfl:    rosuvastatin (CRESTOR) 10 MG tablet, Take 1 tablet (10 mg total) by mouth daily., Disp: 90 tablet, Rfl: 3   Semaglutide, 1 MG/DOSE, 4 MG/3ML SOPN, Inject 1 mg as directed once a week., Disp: 3 mL, Rfl: 1   triamterene-hydrochlorothiazide (MAXZIDE-25) 37.5-25 MG tablet, Take 1 tablet by mouth every morning., Disp: , Rfl:    VITAMIN A PO, Take 1  capsule by mouth every morning., Disp: , Rfl:    Medications ordered in this encounter:  No orders of the defined types were placed in this encounter.    *If you need refills on other medications prior to your next appointment, please contact your pharmacy*  Follow-Up: Call back or seek an in-person evaluation if the symptoms worsen or if the condition fails to improve as anticipated.  [facility] [phone]  Other Instructions Please take antibiotic as directed.  Increase fluid intake.  Use Saline nasal spray.  Take a daily multivitamin. Continue all of your allergy medications. Use the Tessalon as directed for cough.  Place a humidifier in the bedroom.  Please call or return clinic if symptoms are not improving.  Sinusitis Sinusitis is redness, soreness, and swelling (inflammation) of the paranasal sinuses. Paranasal sinuses are air pockets within the bones of your face (beneath the eyes, the middle of the forehead, or above the eyes). In healthy paranasal sinuses, mucus is able to drain out, and air is able to circulate through them by way of your nose. However, when your paranasal sinuses are inflamed, mucus and air can become trapped. This can allow bacteria and other germs to grow and cause infection. Sinusitis can develop quickly and last only a short time (acute) or continue over a long period (chronic). Sinusitis that lasts for more than 12 weeks is considered chronic.  CAUSES  Causes of sinusitis include: Allergies. Structural abnormalities, such as displacement of the cartilage that separates your nostrils (deviated septum), which can decrease the air flow through your nose and sinuses and affect sinus drainage. Functional abnormalities, such as when the small hairs (cilia) that line your sinuses and help remove mucus do not work properly or are not present. SYMPTOMS  Symptoms of acute and chronic sinusitis are the same. The primary symptoms are pain and pressure  around the affected sinuses. Other symptoms include: Upper toothache. Earache. Headache. Bad breath. Decreased sense of smell and taste. A cough, which worsens when you are lying flat. Fatigue. Fever. Thick drainage from your nose, which often is green and may contain pus (purulent). Swelling and warmth over the affected sinuses. DIAGNOSIS  Your caregiver will perform a physical exam. During the exam, your caregiver may: Look in your nose for signs of abnormal growths in your nostrils (nasal polyps). Tap over the affected sinus to check for signs of infection. View the inside of your sinuses (endoscopy) with a special imaging device with a light attached (endoscope), which is inserted into your sinuses. If your caregiver suspects that you have chronic sinusitis, one or more of the following tests may be recommended: Allergy tests. Nasal culture A sample of mucus is taken from your nose and sent to a lab and screened for bacteria. Nasal cytology A sample of mucus is taken from your nose and examined by your caregiver to determine if your sinusitis is related to an allergy. TREATMENT  Most cases of  acute sinusitis are related to a viral infection and will resolve on their own within 10 days. Sometimes medicines are prescribed to help relieve symptoms (pain medicine, decongestants, nasal steroid sprays, or saline sprays).  However, for sinusitis related to a bacterial infection, your caregiver will prescribe antibiotic medicines. These are medicines that will help kill the bacteria causing the infection.  Rarely, sinusitis is caused by a fungal infection. In theses cases, your caregiver will prescribe antifungal medicine. For some cases of chronic sinusitis, surgery is needed. Generally, these are cases in which sinusitis recurs more than 3 times per year, despite other treatments. HOME CARE INSTRUCTIONS  Drink plenty of water. Water helps thin the mucus so your sinuses can drain more  easily. Use a humidifier. Inhale steam 3 to 4 times a day (for example, sit in the bathroom with the shower running). Apply a warm, moist washcloth to your face 3 to 4 times a day, or as directed by your caregiver. Use saline nasal sprays to help moisten and clean your sinuses. Take over-the-counter or prescription medicines for pain, discomfort, or fever only as directed by your caregiver. SEEK IMMEDIATE MEDICAL CARE IF: You have increasing pain or severe headaches. You have nausea, vomiting, or drowsiness. You have swelling around your face. You have vision problems. You have a stiff neck. You have difficulty breathing. MAKE SURE YOU:  Understand these instructions. Will watch your condition. Will get help right away if you are not doing well or get worse. Document Released: 07/18/2005 Document Revised: 10/10/2011 Document Reviewed: 08/02/2011 [facility] Patient Information 2014 [place], [place].    If you have been instructed to have an in-person evaluation today at a local Urgent Care facility, please use the link below. It will take you to a list of all of our available [facility], including address, phone number and hours of operation. Please do not delay care.  [facility]  If you or a family member do not have a primary care provider, use the link below to schedule a visit and establish care. When you choose a [facility] primary care physician or advanced practice provider, you gain a long-term partner in health. Find a Primary Care Provider  Learn more about [facility]'s in-office and virtual care options: [facility]

## 2022-08-31 NOTE — Progress Notes (Signed)
Virtual Visit Consent   Candice Jones, you are scheduled for a virtual visit with a [facility] provider today. Just as with appointments in the office, your consent must be obtained to participate. Your consent will be active for this visit and any virtual visit you may have with one of our providers in the next 365 days. If you have a MyChart account, a copy of this consent can be sent to you electronically.  As this is a virtual visit, video technology does not allow for your provider to perform a traditional examination. This may limit your provider's ability to fully assess your condition. If your provider identifies any concerns that need to be evaluated in person or the need to arrange testing (such as labs, EKG, etc.), we will make arrangements to do so. Although advances in technology are sophisticated, we cannot ensure that it will always work on either your end or our end. If the connection with a video visit is poor, the visit may have to be switched to a telephone visit. With either a video or telephone visit, we are not always able to ensure that we have a secure connection.  By engaging in this virtual visit, you consent to the provision of healthcare and authorize for your insurance to be billed (if applicable) for the services provided during this visit. Depending on your insurance coverage, you may receive a charge related to this service.  I need to obtain your verbal consent now. Are you willing to proceed with your visit today? [name] has provided verbal consent on 08/31/2022 for a virtual visit (video or telephone). [name], [place]  Date: 08/31/2022 12:16 PM  Virtual Visit via Video Note   I, [name], connected with  LAURELLA TULL  ([id number], June 25, 1976) on 08/31/22 at 11:45 AM EST by a video-enabled telemedicine application and verified that I am speaking with the correct person using two identifiers.  Location: Patient: Virtual Visit Location Patient:  Home Provider: Virtual Visit Location Provider: Home   I discussed the limitations of evaluation and management by telemedicine and the availability of in person appointments. The patient expressed understanding and agreed to proceed.    History of Present Illness: Candice Jones is a 47 y.o. who identifies as a female who was assigned female at birth, and is being seen today for progressively worsening sinus symptoms. Notes symptoms started over weekend and have continued to progress. Initially with aches, chills and so she thought she had the flu. Was evaluated in office on 1/29 at which time she tested negative for influenza A/B, RSV, COVID-19 and strep. Diagnosed with a viral URI and given supportive measures and OTC medication recommendations. Despite her allergy medications, nasal steroids and Mucinex, congestion continues to worsen. Now with sinus pain, bilateral ear pain and increased fatigue. Is concerned as she has history of Meniere's disease and is scared that all the congestion and pressure in her ears will trigger a flare.   HPI: HPI  Problems:  Patient Active Problem List   Diagnosis Date Noted   Viral URI 08/29/2022   Family history of colon cancer - sister < 60 02/02/2017   Internal hemorrhoids with bleeding and prolapse 06/24/2014   IBS (irritable bowel syndrome) 06/24/2014   Endometriosis 03/03/2013   Elevated fasting glucose 03/01/2013   Gallstones 05/12/2011    Allergies:  Allergies  Allergen Reactions   Augmentin [Amoxicillin-Pot Clavulanate] Nausea And Vomiting    Severe nausea/vomiting    Codeine  Other (See Comments)    Makes pain worse   Ciprofloxacin Hcl Rash   Erythromycin Rash   Sulfa Antibiotics Rash   Medications:  Current Outpatient Medications:    benzonatate (TESSALON) 100 MG capsule, Take 1 capsule (100 mg total) by mouth 3 (three) times daily as needed for cough., Disp: 30 capsule, Rfl: 0   doxycycline (VIBRA-TABS) 100 MG tablet, Take 1 tablet (100  mg total) by mouth 2 (two) times daily., Disp: 20 tablet, Rfl: 0   albuterol (VENTOLIN HFA) 108 (90 Base) MCG/ACT inhaler, Inhale 2 puffs into the lungs every 4 (four) hours as needed for wheezing or shortness of breath., Disp: 18 g, Rfl: 0   APPLE CIDER VINEGAR PO, Take by mouth. Gummies, includes b6 and b12. Has been taking x4 months., Disp: , Rfl:    Ascorbic Acid (VITAMIN C PO), Take 1 tablet by mouth every morning., Disp: , Rfl:    Azelastine HCl 137 MCG/SPRAY SOLN, Place 2 sprays into both nostrils every morning., Disp: , Rfl:    Blood Glucose Monitoring Suppl KIT, Use to check fasting glucose daily ICD10: R73.01.Dispense based on insurance preference, Disp: 1 kit, Rfl: 0   Cholecalciferol (VITAMIN D3 PO), Take 1 tablet by mouth every morning., Disp: , Rfl:    Cranberry-Vitamin C-Vitamin E (CRANBERRY PLUS VITAMIN C) 4200-20-3 MG-MG-UNIT CAPS, Take 2 capsules by mouth every morning., Disp: , Rfl:    docusate sodium (COLACE) 100 MG capsule, Take 100 mg by mouth every morning., Disp: , Rfl:    Fluocinolone Acetonide Body (DERMA-SMOOTHE/FS BODY) 0.01 % OIL, Apply 1 Application topically 2 (two) times a week. (Patient not taking: Reported on 08/29/2022), Disp: 120 mL, Rfl: 1   fluticasone (FLONASE) 50 MCG/ACT nasal spray, SPRAY 2 SPRAYS INTO EACH NOSTRIL EVERY DAY (Patient taking differently: Place 2 sprays into both nostrils daily as needed for allergies or rhinitis.), Disp: 48 mL, Rfl: 1   glucose blood test strip, Use with glucose meter to check blood sugar.ICD10: R73.01.Dispense based on insurance preference, Disp: 100 each, Rfl: 12   Lancets (FREESTYLE) lancets, Use to check blood sugar ICD10: R73.01.Dispense based on insurance preference, Disp: 100 each, Rfl: 12   levocetirizine (XYZAL) 5 MG tablet, Take 5 mg by mouth every morning., Disp: , Rfl:    losartan (COZAAR) 25 MG tablet, TAKE 1 TABLET (25 MG TOTAL) BY MOUTH DAILY., Disp: 90 tablet, Rfl: 3   metFORMIN (GLUCOPHAGE-XR) 500 MG 24 hr  tablet, TAKE 2 TABLETS BY MOUTH EVERY DAY WITH BREAKFAST (Patient taking differently: Per pt, stated that per Dr. Dennard Schaumann, pt to take 4 every day in the morning w/breakfast), Disp: 180 tablet, Rfl: 3   Misc Natural Products (ELDERBERRY ZINC/VIT C/IMMUNE MT), Use as directed in the mouth or throat., Disp: , Rfl:    Multiple Vitamin (MULTIVITAMIN WITH MINERALS) TABS tablet, Take 1 tablet by mouth every morning., Disp: , Rfl:    Multiple Vitamins-Minerals (IMMUNE SUPPORT) CHEW, Chew 2 tablets by mouth every morning., Disp: , Rfl:    norethindrone-ethinyl estradiol (LOESTRIN) 1-20 MG-MCG tablet, Take 1 tablet by mouth every morning., Disp: , Rfl:    pantoprazole (PROTONIX) 40 MG tablet, TAKE 1 TABLET BY MOUTH TWICE A DAY, Disp: 180 tablet, Rfl: 1   PEPPERMINT OIL PO, Take 1 capsule by mouth every morning., Disp: , Rfl:    rosuvastatin (CRESTOR) 10 MG tablet, Take 1 tablet (10 mg total) by mouth daily., Disp: 90 tablet, Rfl: 3   Semaglutide, 1 MG/DOSE, 4 MG/3ML SOPN, Inject 1  mg as directed once a week., Disp: 3 mL, Rfl: 1   triamterene-hydrochlorothiazide (MAXZIDE-25) 37.5-25 MG tablet, Take 1 tablet by mouth every morning., Disp: , Rfl:    VITAMIN A PO, Take 1 capsule by mouth every morning., Disp: , Rfl:   Observations/Objective: Patient is well-developed, well-nourished in no acute distress.  Resting comfortably at home.  Head is normocephalic, atraumatic.  No labored breathing. Speech is clear and coherent with logical content.  Patient is alert and oriented at baseline.   Assessment and Plan: 1. Acute bacterial sinusitis - doxycycline (VIBRA-TABS) 100 MG tablet; Take 1 tablet (100 mg total) by mouth 2 (two) times daily.  Dispense: 20 tablet; Refill: 0 - benzonatate (TESSALON) 100 MG capsule; Take 1 capsule (100 mg total) by mouth 3 (three) times daily as needed for cough.  Dispense: 30 capsule; Refill: 0  2. ETD (Eustachian tube dysfunction), bilateral  Will have her continue her saline  rinses, Flonase, Astelin and antihistamines. Can continue use of Mucinex D for now. Want to avoid steroids giving her uncontrolled diabetes, but this may have to be considered if ear pressure/popping not improving with other measures to reduce risk of other otic issues giving her history. For now, will start Doxycycline for sinusitis. Tessalon per orders. Strict follow-up precautions discussed.    Follow Up Instructions: I discussed the assessment and treatment plan with the patient. The patient was provided an opportunity to ask questions and all were answered. The patient agreed with the plan and demonstrated an understanding of the instructions.  A copy of instructions were sent to the patient via MyChart unless otherwise noted below.   The patient was advised to call back or seek an in-person evaluation if the symptoms worsen or if the condition fails to improve as anticipated.  Time:  I spent 10 minutes with the patient via telehealth technology discussing the above problems/concerns.    [name], PA-C

## 2022-09-08 ENCOUNTER — Encounter: Payer: Self-pay | Admitting: Family Medicine

## 2022-09-09 ENCOUNTER — Ambulatory Visit
Admission: RE | Admit: 2022-09-09 | Discharge: 2022-09-09 | Disposition: A | Discharge status: Home / Self Care | Payer: BC Managed Care – PPO | Source: Ambulatory Visit | Attending: Nurse Practitioner | Admitting: Nurse Practitioner

## 2022-09-09 ENCOUNTER — Ambulatory Visit (INDEPENDENT_AMBULATORY_CARE_PROVIDER_SITE_OTHER): Payer: BC Managed Care – PPO

## 2022-09-09 VITALS — BP 116/77 | HR 110 | Temp 99.1°F | Resp 18

## 2022-09-09 DIAGNOSIS — R059 Cough, unspecified: Secondary | ICD-10-CM

## 2022-09-09 DIAGNOSIS — T3695XA Adverse effect of unspecified systemic antibiotic, initial encounter: Secondary | ICD-10-CM

## 2022-09-09 DIAGNOSIS — R0602 Shortness of breath: Secondary | ICD-10-CM

## 2022-09-09 DIAGNOSIS — U071 COVID-19: Secondary | ICD-10-CM | POA: Diagnosis not present

## 2022-09-09 DIAGNOSIS — R509 Fever, unspecified: Secondary | ICD-10-CM | POA: Insufficient documentation

## 2022-09-09 DIAGNOSIS — J029 Acute pharyngitis, unspecified: Secondary | ICD-10-CM

## 2022-09-09 DIAGNOSIS — B379 Candidiasis, unspecified: Secondary | ICD-10-CM | POA: Insufficient documentation

## 2022-09-09 LAB — POCT INFLUENZA A/B
Influenza A, POC: NEGATIVE
Influenza B, POC: NEGATIVE

## 2022-09-09 LAB — POCT RAPID STREP A (OFFICE): Rapid Strep A Screen: NEGATIVE

## 2022-09-09 MED ORDER — PREDNISONE 10 MG PO TABS: 20.0000 mg | ORAL_TABLET | Freq: Every day | ORAL | 0 refills | Status: DC

## 2022-09-09 MED ORDER — FLUCONAZOLE 150 MG PO TABS: 150.0000 mg | ORAL_TABLET | Freq: Once | ORAL | 0 refills | Status: AC

## 2022-09-09 MED ORDER — ACETAMINOPHEN 500 MG PO TABS
500.0000 mg | ORAL_TABLET | Freq: Once | ORAL | Status: AC
  Administered 2022-09-09: 500 mg via ORAL

## 2022-09-09 MED ORDER — ALBUTEROL SULFATE HFA 108 (90 BASE) MCG/ACT IN AERS: 1.0000 | INHALATION_SPRAY | Freq: Four times a day (QID) | RESPIRATORY_TRACT | 0 refills | Status: DC | PRN

## 2022-09-09 MED ORDER — ACETAMINOPHEN 325 MG PO TABS: 650.0000 mg | ORAL_TABLET | Freq: Once | ORAL | Status: DC

## 2022-09-09 MED ORDER — PSEUDOEPH-BROMPHEN-DM 30-2-10 MG/5ML PO SYRP: 5.0000 mL | ORAL_SOLUTION | Freq: Four times a day (QID) | ORAL | 0 refills | Status: DC | PRN

## 2022-09-09 NOTE — Discharge Instructions (Addendum)
Your chest xray is negative.  Influenza and Strep Test are all negative. Your COVID is pending. Your results will show in your MyChart. Any positive results will result in a phone call from a nurse with next steps in treatment and recommendations.  You have been prescribed prednisone 20 mg Dosepak to take as directed.   You have been prescribed diflucan for the yeast infection.  You may have an Upper Respiratory Infection We encourage conservative treatment with symptom relief. We encourage you to use Tylenol alternating with Ibuprofen for your fever if not contraindicated. (Remember to use as directed do not exceed daily dosing recommendations) We also encourage salt water gargles for your sore throat. You should also consider throat lozenges and chloraseptic spray.  Your cough can be soothed with a cough suppressant. We have prescribed you a cough suppressant to be taken as  directed.

## 2022-09-09 NOTE — ED Provider Notes (Signed)
[facility]    CSN: [id number] Arrival date & time: 09/09/22  1013      History   Chief Complaint Chief Complaint  Patient presents with   Cough    I have been sick since 1/28, saw a NP and then urgent care telemed last week, not getting better. I put cough because that is one of the most annoying symptoms because sick for 2 weeks wasn't an option. - Entered by patient    HPI Candice Jones is a 46 y.o. female.   HPI  Past Medical History:  Diagnosis Date   Adenomyosis    Asthma    COVID-19    Diabetes ([place])    Gall stones    Gestational diabetes mellitus    Hiatal hernia    Hyperlipidemia    IBS (irritable bowel syndrome)    Internal hemorrhoids    Kidney stones    Meniere's syndrome    Vertigo     Patient Active Problem List   Diagnosis Date Noted   Viral URI 08/29/2022   Family history of colon cancer - sister < 60 02/02/2017   Internal hemorrhoids with bleeding and prolapse 06/24/2014   IBS (irritable bowel syndrome) 06/24/2014   Endometriosis 03/03/2013   Elevated fasting glucose 03/01/2013   Gallstones 05/12/2011    Past Surgical History:  Procedure Laterality Date   CESAREAN SECTION  08/1999, 05/2003   CHOLECYSTECTOMY  2011   CYSTECTOMY  1997   left wrist    ENDOMETRIAL ABLATION  01/2010   HEMORRHOID BANDING  2015   MINOR HEMORRHOIDECTOMY  04/2014   thrombosed   WISDOM TOOTH EXTRACTION  1996/1997    OB History   No obstetric history on file.      Home Medications    Prior to Admission medications   Medication Sig Start Date End Date Taking? Authorizing Provider  brompheniramine-pseudoephedrine-DM 30-2-10 MG/5ML syrup Take 5 mLs by mouth 4 (four) times daily as needed. 09/09/22  Yes Vevelyn Francois, NP  fluconazole (DIFLUCAN) 150 MG tablet Take 1 tablet (150 mg total) by mouth once for 1 dose. 09/09/22 09/09/22 Yes Arturo Sofranko, Diona Foley, NP  predniSONE (DELTASONE) 10 MG tablet Take 2 tablets (20 mg total) by mouth daily. 09/09/22  Yes Vevelyn Francois, NP  albuterol (VENTOLIN HFA) 108 (90 Base) MCG/ACT inhaler Inhale 2 puffs into the lungs every 4 (four) hours as needed for wheezing or shortness of breath. 02/11/20   Annie Main, FNP  APPLE CIDER VINEGAR PO Take by mouth. Gummies, includes b6 and b12. Has been taking x4 months.    [provider]  Ascorbic Acid (VITAMIN C PO) Take 1 tablet by mouth every morning.    [provider]  Azelastine HCl 137 MCG/SPRAY SOLN Place 2 sprays into both nostrils every morning. 04/13/21   [provider]  benzonatate (TESSALON) 100 MG capsule Take 1 capsule (100 mg total) by mouth 3 (three) times daily as needed for cough. 08/31/22   Brunetta Jeans, PA-C  Blood Glucose Monitoring Suppl KIT Use to check fasting glucose daily ICD10: R73.01.Dispense based on insurance preference 07/18/22   Susy Frizzle, MD  Cholecalciferol (VITAMIN D3 PO) Take 1 tablet by mouth every morning.    [provider]  Cranberry-Vitamin C-Vitamin E (CRANBERRY PLUS VITAMIN C) 4200-20-3 MG-MG-UNIT CAPS Take 2 capsules by mouth every morning.    [provider]  docusate sodium (COLACE) 100 MG capsule Take 100 mg by mouth every morning.  [provider]  doxycycline (VIBRA-TABS) 100 MG tablet Take 1 tablet (100 mg total) by mouth 2 (two) times daily. 08/31/22   Brunetta Jeans, PA-C  Fluocinolone Acetonide Body (DERMA-SMOOTHE/FS BODY) 0.01 % OIL Apply 1 Application topically 2 (two) times a week. Patient not taking: Reported on 08/29/2022 07/11/22   Susy Frizzle, MD  fluticasone [facility]) 50 MCG/ACT nasal spray SPRAY 2 SPRAYS INTO EACH NOSTRIL EVERY DAY Patient taking differently: Place 2 sprays into both nostrils daily as needed for allergies or rhinitis. 03/19/20   Ishmael Holter A, FNP  glucose blood test strip Use with glucose meter to check blood sugar.ICD10: R73.01.Dispense based on insurance preference 02/09/18   Susy Frizzle, MD  Lancets (FREESTYLE)  lancets Use to check blood sugar ICD10: R73.01.Dispense based on insurance preference 02/09/18   Susy Frizzle, MD  levocetirizine (XYZAL) 5 MG tablet Take 5 mg by mouth every morning.    [provider]  losartan (COZAAR) 25 MG tablet TAKE 1 TABLET (25 MG TOTAL) BY MOUTH DAILY. 03/14/22   Susy Frizzle, MD  metFORMIN (GLUCOPHAGE-XR) 500 MG 24 hr tablet TAKE 2 TABLETS BY MOUTH EVERY DAY WITH BREAKFAST Patient taking differently: Per pt, stated that per Dr. Dennard Schaumann, pt to take 4 every day in the morning w/breakfast 07/04/22   Susy Frizzle, MD  Misc Natural Products (ELDERBERRY ZINC/VIT C/IMMUNE MT) Use as directed in the mouth or throat.    [provider]  Multiple Vitamin (MULTIVITAMIN WITH MINERALS) TABS tablet Take 1 tablet by mouth every morning.    [provider]  Multiple Vitamins-Minerals (IMMUNE SUPPORT) CHEW Chew 2 tablets by mouth every morning.    [provider]  norethindrone-ethinyl estradiol (LOESTRIN) 1-20 MG-MCG tablet Take 1 tablet by mouth every morning.    [provider]  pantoprazole (PROTONIX) 40 MG tablet TAKE 1 TABLET BY MOUTH TWICE A DAY 04/15/22   Susy Frizzle, MD  PEPPERMINT OIL PO Take 1 capsule by mouth every morning.    [provider]  rosuvastatin (CRESTOR) 10 MG tablet Take 1 tablet (10 mg total) by mouth daily. 07/22/22   Susy Frizzle, MD  Semaglutide, 1 MG/DOSE, 4 MG/3ML SOPN Inject 1 mg as directed once a week. 08/18/22   Susy Frizzle, MD  triamterene-hydrochlorothiazide (MAXZIDE-25) 37.5-25 MG tablet Take 1 tablet by mouth every morning. 04/02/21   [provider]  VITAMIN A PO Take 1 capsule by mouth every morning.    [provider]    Family History Family History  Problem Relation Age of Onset   High Cholesterol Mother    Hypertension Mother    Heart disease Mother    Diabetes Father    High Cholesterol Father    Colon cancer Sister        negative for  Lynch syndrome   Diverticulitis Sister    Diabetes Maternal Grandmother    Heart disease Maternal Grandmother    Hypertension Maternal Grandmother    High Cholesterol Maternal Grandmother    Kidney cancer Maternal Grandmother    Heart disease Maternal Grandfather    High Cholesterol Maternal Grandfather    Cancer Maternal Grandfather    Diabetes Paternal Grandmother    Prostate cancer Paternal Grandfather    Lupus Sister     Social History Social History   Tobacco Use   Smoking status: Never   Smokeless tobacco: Never  Vaping Use   Vaping Use: Never used  Substance Use Topics  Alcohol use: Yes    Comment: rarely   Drug use: No     Allergies   Augmentin [amoxicillin-pot clavulanate], Codeine, Ciprofloxacin hcl, Erythromycin, and Sulfa antibiotics   Review of Systems Review of Systems   Physical Exam Triage Vital Signs ED Triage Vitals  Enc Vitals Group     BP 09/09/22 1041 116/77     Pulse Rate 09/09/22 1041 (!) 110     Resp 09/09/22 1041 18     Temp 09/09/22 1041 99.1 F (37.3 C)     Temp Source 09/09/22 1041 Oral     SpO2 09/09/22 1041 96 %     Weight --      Height --      Head Circumference --      Peak Flow --      Pain Score 09/09/22 1042 5     Pain Loc --      Pain Edu? --      Excl. in [place]? --    No data found.  Updated Vital Signs BP 116/77 (BP Location: Right Arm)   Pulse (!) 110   Temp 99.1 F (37.3 C) (Oral)   Resp 18   SpO2 96%   Visual Acuity Right Eye Distance:   Left Eye Distance:   Bilateral Distance:    Right Eye Near:   Left Eye Near:    Bilateral Near:     Physical Exam   UC Treatments / Results  Labs (all labs ordered are listed, but only abnormal results are displayed) Labs Reviewed  SARS CORONAVIRUS 2 (TAT 6-24 HRS)  POCT RAPID STREP A (OFFICE)  POCT INFLUENZA A/B    EKG   Radiology DG Chest 2 View  Result Date: 09/09/2022 CLINICAL DATA:  Shortness of breath.  Cough and congestion. EXAM: CHEST - 2 VIEW  COMPARISON:  Chest radiographs 06/22/2021 FINDINGS: The cardiomediastinal silhouette is unchanged with normal heart size. The lungs are well inflated. There is mild chronic coarsening of the interstitial markings. No airspace consolidation, overt pulmonary edema, pleural effusion, or pneumothorax is identified. Right upper quadrant abdominal surgical clips are noted. No acute osseous abnormality is seen. IMPRESSION: No active cardiopulmonary disease. Electronically Signed   By: Logan Bores M.D.   On: 09/09/2022 11:27    Procedures Procedures (including critical care time)  Medications Ordered in UC Medications  acetaminophen (TYLENOL) tablet 500 mg (500 mg Oral Given 09/09/22 1123)    Initial Impression / Assessment and Plan / UC Course  I have reviewed the triage vital signs and the nursing notes.  Pertinent labs & imaging results that were available during my care of the patient were reviewed by me and considered in my medical decision making (see chart for details).     Shortness of breath Final Clinical Impressions(s) / UC Diagnoses   Final diagnoses:  SOB (shortness of breath)  Sore throat  Fever, unspecified fever cause  Antibiotic-induced yeast infection     Discharge Instructions      Your chest xray is negative.  Influenza and Strep Test are all negative. Your COVID is pending. Your results will show in your MyChart. Any positive results will result in a phone call from a nurse with next steps in treatment and recommendations.  You have been prescribed prednisone 20 mg Dosepak to take as directed.   You have been prescribed diflucan for the yeast infection.  You may have an Upper Respiratory Infection We encourage conservative treatment with symptom relief. We encourage you to  use Tylenol alternating with Ibuprofen for your fever if not contraindicated. (Remember to use as directed do not exceed daily dosing recommendations) We also encourage salt water gargles for your  sore throat. You should also consider throat lozenges and chloraseptic spray.  Your cough can be soothed with a cough suppressant. We have prescribed you a cough suppressant to be taken as  directed.       ED Prescriptions     Medication Sig Dispense Auth. Provider   fluconazole (DIFLUCAN) 150 MG tablet Take 1 tablet (150 mg total) by mouth once for 1 dose. 1 tablet Vevelyn Francois, NP   brompheniramine-pseudoephedrine-DM 30-2-10 MG/5ML syrup Take 5 mLs by mouth 4 (four) times daily as needed. 120 mL Vevelyn Francois, NP   predniSONE (DELTASONE) 10 MG tablet Take 2 tablets (20 mg total) by mouth daily. 15 tablet Vevelyn Francois, NP      PDMP not reviewed this encounter.   Dionisio David [place], [place] 09/09/22 1201

## 2022-09-09 NOTE — ED Triage Notes (Signed)
Was seen by PCP on last Thursday and was given doxycyline for sinus infection.  States she got better for a few days.  States she feels week with congestion and cough.  States left ear hurts with chills today.

## 2022-09-10 ENCOUNTER — Telehealth: Payer: Self-pay | Admitting: Family Medicine

## 2022-09-10 LAB — SARS CORONAVIRUS 2 (TAT 6-24 HRS): SARS Coronavirus 2: POSITIVE — AB

## 2022-09-10 MED ORDER — MOLNUPIRAVIR 200 MG PO CAPS: 4.0000 | ORAL_CAPSULE | Freq: Two times a day (BID) | ORAL | 0 refills | Status: AC

## 2022-09-10 NOTE — Telephone Encounter (Signed)
COVID-positive, good candidate for molnupiravir.  Patient was called and discussed that she was not a good candidate for Paxlovid due to her chronic medication (statin) being contraindicated with it.  Molnupiravir was sent, patient aware

## 2022-09-11 ENCOUNTER — Encounter: Payer: Self-pay | Admitting: Family Medicine

## 2022-09-19 ENCOUNTER — Other Ambulatory Visit: Payer: Self-pay | Admitting: Family Medicine

## 2022-09-28 ENCOUNTER — Telehealth: Payer: Self-pay | Admitting: Family Medicine

## 2022-09-28 MED ORDER — GLUCOSE BLOOD VI STRP: ORAL_STRIP | 12 refills | Status: AC

## 2022-09-28 NOTE — Addendum Note (Signed)
Addended by: Colman Cater on: 09/28/2022 11:53 AM   Modules accepted: Orders

## 2022-09-28 NOTE — Telephone Encounter (Signed)
Prescription Request  09/28/2022   LOV: 07/21/2022  What is the name of the medication or equipment?   glucose blood test strip [id number]   Have you contacted your pharmacy to request a refill? Yes   Which pharmacy would you like this sent to?  [id number]: [phone]: [phone]   Patient notified that their request is being sent to the clinical staff for review and that they should receive a response within 2 business days.   Please advise pharmacist at [phone]

## 2022-10-01 ENCOUNTER — Other Ambulatory Visit: Payer: Self-pay | Admitting: Nurse Practitioner

## 2022-10-04 ENCOUNTER — Encounter: Payer: Self-pay | Admitting: Family Medicine

## 2022-10-22 ENCOUNTER — Encounter: Payer: Self-pay | Admitting: Family Medicine

## 2022-10-24 ENCOUNTER — Ambulatory Visit: Payer: BC Managed Care – PPO | Admitting: Family Medicine

## 2022-10-24 ENCOUNTER — Other Ambulatory Visit: Payer: Self-pay

## 2022-10-24 ENCOUNTER — Other Ambulatory Visit: Payer: BC Managed Care – PPO

## 2022-10-24 DIAGNOSIS — E1165 Type 2 diabetes mellitus with hyperglycemia: Secondary | ICD-10-CM

## 2022-10-24 MED ORDER — SEMAGLUTIDE (2 MG/DOSE) 8 MG/3ML ~~LOC~~ SOPN: 2.0000 mg | PEN_INJECTOR | SUBCUTANEOUS | 1 refills | Status: DC

## 2022-10-31 ENCOUNTER — Other Ambulatory Visit: Payer: BC Managed Care – PPO

## 2022-10-31 DIAGNOSIS — R5383 Other fatigue: Secondary | ICD-10-CM

## 2022-10-31 DIAGNOSIS — E7800 Pure hypercholesterolemia, unspecified: Secondary | ICD-10-CM

## 2022-10-31 DIAGNOSIS — E119 Type 2 diabetes mellitus without complications: Secondary | ICD-10-CM

## 2022-11-01 LAB — LIPID PANEL
Cholesterol: 107 mg/dL (ref <200)
HDL: 38 mg/dL — ABNORMAL LOW (ref >=50)
LDL Cholesterol (Calc): 50 mg/dL (calc)
Non-HDL Cholesterol (Calc): 69 mg/dL (calc) (ref <130)
Total CHOL/HDL Ratio: 2.8 (calc) (ref <5.0)
Triglycerides: 107 mg/dL (ref <150)

## 2022-11-03 ENCOUNTER — Ambulatory Visit (INDEPENDENT_AMBULATORY_CARE_PROVIDER_SITE_OTHER): Payer: BC Managed Care – PPO | Admitting: Family Medicine

## 2022-11-03 ENCOUNTER — Encounter: Payer: Self-pay | Admitting: Family Medicine

## 2022-11-03 VITALS — BP 130/84 | HR 114 | Temp 98.6°F | Ht 64.0 in | Wt 183.0 lb

## 2022-11-03 DIAGNOSIS — E119 Type 2 diabetes mellitus without complications: Secondary | ICD-10-CM

## 2022-11-03 DIAGNOSIS — R000 Tachycardia, unspecified: Secondary | ICD-10-CM | POA: Diagnosis not present

## 2022-11-03 MED ORDER — METOPROLOL SUCCINATE ER 25 MG PO TB24
25.0000 mg | ORAL_TABLET | Freq: Every day | ORAL | 3 refills | Status: DC
Start: 2022-11-03 — End: 2022-11-11

## 2022-11-03 NOTE — Progress Notes (Signed)
Subjective:    Patient ID: Candice Jones, female    DOB: 10-Nov-1975, 47 y.o.   MRN: [id number] Patient is here today for her normal follow-up regarding her diabetes.  She is currently on 2 mg of Ozempic and she reports fasting blood sugars between 110 and 140.  However she reports a racing heart rate this morning and feeling extremely fatigued and tired.  Her heart rate is over 130 on my exam although it is regular.  She denies any chest pain or shortness of breath.  She denies any stimulant use. Wt Readings from Last 3 Encounters:  11/03/22 183 lb (83 kg)  08/29/22 197 lb (89.4 kg)  07/21/22 202 lb (91.6 kg)   She has lost 20 pounds since starting Ozempic. Lab on 10/31/2022  Component Date Value Ref Range Status   Cholesterol 10/31/2022 107  <200 mg/dL Final   HDL 10/31/2022 38 (L)  > OR = 50 mg/dL Final   Triglycerides 10/31/2022 107  <150 mg/dL Final   LDL Cholesterol (Calc) 10/31/2022 50  mg/dL (calc) Final   Comment: Reference range: <100 . Desirable range <100 mg/dL for primary prevention;   <70 mg/dL for patients with CHD or diabetic patients  with > or = 2 CHD risk factors. Marland Kitchen LDL-C is now calculated using the Martin-Hopkins  calculation, which is a validated novel method providing  better accuracy than the Friedewald equation in the  estimation of LDL-C.  Cresenciano Genre et al. Annamaria Helling. [id number]): 2061-2068  (http://education.[facility].com/faq/FAQ164)    Total CHOL/HDL Ratio 10/31/2022 2.8  <5.0 (calc) Final   Non-HDL Cholesterol (Calc) 10/31/2022 69  <130 mg/dL (calc) Final   Comment: For patients with diabetes plus 1 major ASCVD risk  factor, treating to a non-HDL-C goal of <100 mg/dL  (LDL-C of <70 mg/dL) is considered a therapeutic  option.    WBC 10/31/2022 9.6  3.8 - 10.8 Thousand/uL Final   RBC 10/31/2022 5.23 (H)  3.80 - 5.10 Million/uL Final   Hemoglobin 10/31/2022 15.7 (H)  11.7 - 15.5 g/dL Final   HCT 10/31/2022 45.6 (H)  35.0 - 45.0 % Final   MCV 10/31/2022  87.2  80.0 - 100.0 fL Final   MCH 10/31/2022 30.0  27.0 - 33.0 pg Final   MCHC 10/31/2022 34.4  32.0 - 36.0 g/dL Final   RDW 10/31/2022 12.3  11.0 - 15.0 % Final   Platelets 10/31/2022 395  140 - 400 Thousand/uL Final   MPV 10/31/2022 9.3  7.5 - 12.5 fL Final   Neutro Abs 10/31/2022 5,885  1,500 - 7,800 cells/uL Final   Lymphs Abs 10/31/2022 2,937.6  850 - 3,900 cells/uL Final   Absolute Monocytes 10/31/2022 662  200 - 950 cells/uL Final   Eosinophils Absolute 10/31/2022 77  15 - 500 cells/uL Final   Basophils Absolute 10/31/2022 38  0 - 200 cells/uL Final   Neutrophils Relative % 10/31/2022 61.3  % Final   Total Lymphocyte 10/31/2022 30.6  % Final   Monocytes Relative 10/31/2022 6.9  % Final   Eosinophils Relative 10/31/2022 0.8  % Final   Basophils Relative 10/31/2022 0.4  % Final   Glucose, Bld 10/31/2022 142 (H)  65 - 99 mg/dL Final   Comment: .            Fasting reference interval . For someone without known diabetes, a glucose value >125 mg/dL indicates that they may have diabetes and this should be confirmed with a follow-up test. .    BUN 10/31/2022 11  7 - 25 mg/dL Final   Creat 10/31/2022 0.73  0.50 - 0.99 mg/dL Final   BUN/Creatinine Ratio 10/31/2022 SEE NOTE:  6 - 22 (calc) Final   Comment:    Not Reported: BUN and Creatinine are within    reference range. .    Sodium 10/31/2022 141  135 - 146 mmol/L Final   Potassium 10/31/2022 3.6  3.5 - 5.3 mmol/L Final   Chloride 10/31/2022 101  98 - 110 mmol/L Final   CO2 10/31/2022 27  20 - 32 mmol/L Final   Calcium 10/31/2022 9.3  8.6 - 10.2 mg/dL Final   Total Protein 10/31/2022 6.7  6.1 - 8.1 g/dL Final   Albumin 10/31/2022 4.1  3.6 - 5.1 g/dL Final   Globulin 10/31/2022 2.6  1.9 - 3.7 g/dL (calc) Final   AG Ratio 10/31/2022 1.6  1.0 - 2.5 (calc) Final   Total Bilirubin 10/31/2022 0.4  0.2 - 1.2 mg/dL Final   Alkaline phosphatase (APISO) 10/31/2022 68  31 - 125 U/L Final   AST 10/31/2022 15  10 - 35 U/L Final   ALT  10/31/2022 17  6 - 29 U/L Final     Past Medical History:  Diagnosis Date   Adenomyosis    Asthma    COVID-19    Diabetes    Gall stones    Gestational diabetes mellitus    Hiatal hernia    Hyperlipidemia    IBS (irritable bowel syndrome)    Internal hemorrhoids    Kidney stones    Meniere's syndrome    Vertigo    Past Surgical History:  Procedure Laterality Date   CESAREAN SECTION  08/1999, 05/2003   CHOLECYSTECTOMY  2011   CYSTECTOMY  1997   left wrist    ENDOMETRIAL ABLATION  01/2010   HEMORRHOID BANDING  2015   MINOR HEMORRHOIDECTOMY  04/2014   thrombosed   [place] EXTRACTION  1996/1997   Current Outpatient Medications on File Prior to Visit  Medication Sig Dispense Refill   albuterol (VENTOLIN HFA) 108 (90 Base) MCG/ACT inhaler Inhale 2 puffs into the lungs every 4 (four) hours as needed for wheezing or shortness of breath. 18 g 0   albuterol (VENTOLIN HFA) 108 (90 Base) MCG/ACT inhaler INHALE 1-2 PUFFS BY MOUTH EVERY 6 HOURS AS NEEDED FOR WHEEZE OR SHORTNESS OF BREATH 8.5 each 5   APPLE CIDER VINEGAR PO Take by mouth. Gummies, includes b6 and b12. Has been taking x4 months.     Ascorbic Acid (VITAMIN C PO) Take 1 tablet by mouth every morning.     Azelastine HCl 137 MCG/SPRAY SOLN Place 2 sprays into both nostrils every morning.     benzonatate (TESSALON) 100 MG capsule Take 1 capsule (100 mg total) by mouth 3 (three) times daily as needed for cough. 30 capsule 0   Blood Glucose Monitoring Suppl KIT Use to check fasting glucose daily ICD10: R73.01.Dispense based on insurance preference 1 kit 0   brompheniramine-pseudoephedrine-DM 30-2-10 MG/5ML syrup Take 5 mLs by mouth 4 (four) times daily as needed. 120 mL 0   Cholecalciferol (VITAMIN D3 PO) Take 1 tablet by mouth every morning.     Cranberry-Vitamin C-Vitamin E (CRANBERRY PLUS VITAMIN C) 4200-20-3 MG-MG-UNIT CAPS Take 2 capsules by mouth every morning.     docusate sodium (COLACE) 100 MG capsule Take 100 mg by  mouth every morning.     doxycycline (VIBRA-TABS) 100 MG tablet Take 1 tablet (100 mg total) by mouth 2 (two) times daily.  20 tablet 0   Fluocinolone Acetonide Body (DERMA-SMOOTHE/FS BODY) 0.01 % OIL Apply 1 Application topically 2 (two) times a week. (Patient not taking: Reported on 08/29/2022) 120 mL 1   fluticasone (FLONASE) 50 MCG/ACT nasal spray SPRAY 2 SPRAYS INTO EACH NOSTRIL EVERY DAY (Patient taking differently: Place 2 sprays into both nostrils daily as needed for allergies or rhinitis.) 48 mL 1   glucose blood test strip Use with glucose meter to check blood sugar.ICD10: R73.01.Dispense based on insurance preference 100 each 12   Lancets (FREESTYLE) lancets Use to check blood sugar ICD10: R73.01.Dispense based on insurance preference 100 each 12   levocetirizine (XYZAL) 5 MG tablet Take 5 mg by mouth every morning.     losartan (COZAAR) 25 MG tablet TAKE 1 TABLET (25 MG TOTAL) BY MOUTH DAILY. 90 tablet 3   metFORMIN (GLUCOPHAGE-XR) 500 MG 24 hr tablet TAKE 2 TABLETS BY MOUTH EVERY DAY WITH BREAKFAST (Patient taking differently: Per pt, stated that per Dr. Dennard Schaumann, pt to take 4 every day in the morning w/breakfast) 180 tablet 3   Misc Natural Products (ELDERBERRY ZINC/VIT C/IMMUNE MT) Use as directed in the mouth or throat.     Multiple Vitamin (MULTIVITAMIN WITH MINERALS) TABS tablet Take 1 tablet by mouth every morning.     Multiple Vitamins-Minerals (IMMUNE SUPPORT) CHEW Chew 2 tablets by mouth every morning.     norethindrone-ethinyl estradiol (LOESTRIN) 1-20 MG-MCG tablet Take 1 tablet by mouth every morning.     pantoprazole (PROTONIX) 40 MG tablet TAKE 1 TABLET BY MOUTH TWICE A DAY 180 tablet 1   PEPPERMINT OIL PO Take 1 capsule by mouth every morning.     predniSONE (DELTASONE) 10 MG tablet Take 2 tablets (20 mg total) by mouth daily. 15 tablet 0   rosuvastatin (CRESTOR) 10 MG tablet Take 1 tablet (10 mg total) by mouth daily. 90 tablet 3   Semaglutide, 2 MG/DOSE, 8 MG/3ML SOPN  Inject 2 mg as directed once a week. 3 mL 1   triamterene-hydrochlorothiazide (MAXZIDE-25) 37.5-25 MG tablet Take 1 tablet by mouth every morning.     VITAMIN A PO Take 1 capsule by mouth every morning.     No current facility-administered medications on file prior to visit.   Allergies  Allergen Reactions   Augmentin [Amoxicillin-Pot Clavulanate] Nausea And Vomiting    Severe nausea/vomiting    Codeine Other (See Comments)    Makes pain worse   Ciprofloxacin Hcl Rash   Erythromycin Rash   Sulfa Antibiotics Rash   Social History   Socioeconomic History   Marital status: Married    Spouse name: Not on file   Number of children: 3   Years of education: Not on file   Highest education level: Bachelor's degree (e.g., BA, AB, BS)  Occupational History   Occupation: Set designer: [employer]  Tobacco Use   Smoking status: Never   Smokeless tobacco: Never  Vaping Use   Vaping Use: Never used  Substance and Sexual Activity   Alcohol use: Yes    Comment: rarely   Drug use: No   Sexual activity: Yes  Other Topics Concern   Not on file  Social History Narrative   Married, 2 sons and 1 daughter   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   No caffeine alcohol tobacco or drugs   Social Determinants of Health   Financial Resource Strain: Low Risk  (10/31/2022)   Overall Financial Resource Strain (CARDIA)  Difficulty of Paying Living Expenses: Not hard at all  Food Insecurity: No Food Insecurity (10/31/2022)   Hunger Vital Sign    Worried About Running Out of Food in the Last Year: Never true    Ran Out of Food in the Last Year: Never true  Transportation Needs: No Transportation Needs (10/31/2022)   PRAPARE - Hydrologist (Medical): No    Lack of Transportation (Non-Medical): No  Physical Activity: Unknown (10/31/2022)   Exercise Vital Sign    Days of Exercise per Week: 0 days    Minutes of Exercise per  Session: Not on file  Stress: No Stress Concern Present (10/31/2022)   [place]    Feeling of Stress : Only a little  Social Connections: Socially Integrated (10/31/2022)   Social Connection and Isolation Panel [NHANES]    Frequency of Communication with Friends and Family: More than three times a week    Frequency of Social Gatherings with Friends and Family: Once a week    Attends Religious Services: More than 4 times per year    Active Member of [employer] or Organizations: Yes    Attends Music therapist: More than 4 times per year    Marital Status: Married  Human resources officer Violence: Not on file      Review of Systems  Constitutional: Negative.   All other systems reviewed and are negative.      Objective:   Physical Exam Vitals reviewed.  Constitutional:      General: She is not in acute distress.    Appearance: Normal appearance. She is well-developed and normal weight. She is not ill-appearing, toxic-appearing or diaphoretic.  HENT:     Head: Normocephalic and atraumatic.  Cardiovascular:     Rate and Rhythm: Regular rhythm. Tachycardia present.     Heart sounds: Normal heart sounds. No murmur heard.    No friction rub. No gallop.  Pulmonary:     Effort: Pulmonary effort is normal. No respiratory distress.     Breath sounds: Normal breath sounds. No stridor. No wheezing, rhonchi or rales.  Chest:     Chest wall: No tenderness.  Abdominal:     Tenderness: There is no abdominal tenderness. There is no guarding.  Musculoskeletal:     Right lower leg: No edema.     Left lower leg: No edema.  Skin:    General: Skin is warm.     Coloration: Skin is not jaundiced or pale.     Findings: No bruising, erythema, lesion or rash.  Neurological:     General: No focal deficit present.     Mental Status: She is alert and oriented to person, place, and time. Mental status is at baseline.     Cranial  Nerves: No cranial nerve deficit.     Sensory: No sensory deficit.     Motor: No weakness.     Gait: Gait normal.  Psychiatric:        Mood and Affect: Mood normal.        Behavior: Behavior normal.        Thought Content: Thought content normal.        Judgment: Judgment normal.           Assessment & Plan:  Tachycardia - Plan: EKG 12-Lead, D-dimer, quantitative, TSH  Controlled type 2 diabetes mellitus without complication, without long-term current use of insulin I am concerned by the patient's tachycardia.  There is no explanation for it.  She does not have any peripheral edema to suggest a blood clot.  She denies any pleurisy or shortness of breath.  I will check a D-dimer.  I will also check a TSH to evaluate for hyperthyroidism.  Meanwhile I will start Toprol-XL 25 mg daily due to the tachycardia as I believe the tachycardia is causing her to feel flushed and nervous and tired.  The labs showed no evidence of anemia or dehydration.  Her blood pressure is excellent therefore I do not feel that anemia or hypotension or dehydration as a cause of the tachycardia.  Diabetes sounds well-controlled.  Ozempic seems to be working well for weight loss assuming that there is no underlying hyperthyroidism.  Patient has been monitoring her resting heart rate at home and it has been in the upper 90s for the last several weeks.

## 2022-11-04 LAB — COMPREHENSIVE METABOLIC PANEL
AG Ratio: 1.6 (calc) (ref 1.0–2.5)
ALT: 17 U/L (ref 6–29)
AST: 15 U/L (ref 10–35)
Albumin: 4.1 g/dL (ref 3.6–5.1)
Alkaline phosphatase (APISO): 68 U/L (ref 31–125)
BUN: 11 mg/dL (ref 7–25)
CO2: 27 mmol/L (ref 20–32)
Calcium: 9.3 mg/dL (ref 8.6–10.2)
Chloride: 101 mmol/L (ref 98–110)
Creat: 0.73 mg/dL (ref 0.50–0.99)
Globulin: 2.6 g/dL (calc) (ref 1.9–3.7)
Glucose, Bld: 142 mg/dL — ABNORMAL HIGH (ref 65–99)
Potassium: 3.6 mmol/L (ref 3.5–5.3)
Sodium: 141 mmol/L (ref 135–146)
Total Bilirubin: 0.4 mg/dL (ref 0.2–1.2)
Total Protein: 6.7 g/dL (ref 6.1–8.1)

## 2022-11-04 LAB — CBC WITH DIFFERENTIAL/PLATELET
Absolute Monocytes: 662 cells/uL (ref 200–950)
Basophils Absolute: 38 cells/uL (ref 0–200)
Basophils Relative: 0.4 %
Eosinophils Absolute: 77 cells/uL (ref 15–500)
Eosinophils Relative: 0.8 %
HCT: 45.6 % — ABNORMAL HIGH (ref 35.0–45.0)
Hemoglobin: 15.7 g/dL — ABNORMAL HIGH (ref 11.7–15.5)
Lymphs Abs: 2937.6 cells/uL (ref 850–3900)
MCH: 30 pg (ref 27.0–33.0)
MCHC: 34.4 g/dL (ref 32.0–36.0)
MCV: 87.2 fL (ref 80.0–100.0)
MPV: 9.3 fL (ref 7.5–12.5)
Monocytes Relative: 6.9 %
Neutro Abs: 5885 cells/uL (ref 1500–7800)
Neutrophils Relative %: 61.3 %
Platelets: 395 10*3/uL (ref 140–400)
RBC: 5.23 10*6/uL — ABNORMAL HIGH (ref 3.80–5.10)
RDW: 12.3 % (ref 11.0–15.0)
Total Lymphocyte: 30.6 %
WBC: 9.6 10*3/uL (ref 3.8–10.8)

## 2022-11-04 LAB — TEST AUTHORIZATION

## 2022-11-04 LAB — D-DIMER, QUANTITATIVE: D-Dimer, Quant: <0.19 mcg/mL FEU (ref <0.50)

## 2022-11-04 LAB — TSH: TSH: 1.44 mIU/L

## 2022-11-04 LAB — HEMOGLOBIN A1C W/OUT EAG: Hgb A1c MFr Bld: 6.4 % of total Hgb — ABNORMAL HIGH (ref <5.7)

## 2022-11-11 ENCOUNTER — Ambulatory Visit: Payer: BC Managed Care – PPO

## 2022-11-11 VITALS — BP 126/82 | HR 98 | Ht 64.0 in | Wt 183.0 lb

## 2022-11-11 DIAGNOSIS — R000 Tachycardia, unspecified: Secondary | ICD-10-CM

## 2022-11-11 MED ORDER — METOPROLOL SUCCINATE ER 50 MG PO TB24
50.0000 mg | ORAL_TABLET | Freq: Every day | ORAL | 1 refills | Status: DC
Start: 2022-11-11 — End: 2022-12-12

## 2022-11-11 NOTE — Progress Notes (Signed)
Dr. Tanya Nones advised of patient's HR and BP. Per Dr. Tanya Nones, increased Metoprolol from 25 mg a day to 50 mg a day. Pt advised and verbalized understanding of all. New rx sent to pt's pharmacy as ordered. Mjp,lpn

## 2022-12-10 ENCOUNTER — Other Ambulatory Visit: Payer: Self-pay | Admitting: Family Medicine

## 2022-12-10 DIAGNOSIS — R000 Tachycardia, unspecified: Secondary | ICD-10-CM

## 2022-12-12 NOTE — Telephone Encounter (Signed)
Requested Prescriptions  Pending Prescriptions Disp Refills   metoprolol succinate (TOPROL-XL) 50 MG 24 hr tablet [Pharmacy Med Name: METOPROLOL SUCC ER 50 MG TAB] 90 tablet 1    Sig: TAKE 1 TABLET BY MOUTH DAILY. TAKE WITH OR IMMEDIATELY FOLLOWING A MEAL.     Cardiovascular:  Beta Blockers Failed - 12/10/2022  2:34 PM      Failed - Valid encounter within last 6 months    Recent Outpatient Visits           1 year ago Acute cystitis without hematuria   [facility] Tanya Nones, Priscille Heidelberg, MD   1 year ago Fatigue, unspecified type   [facility] Donita Brooks, MD   2 years ago Acute midline low back pain with left-sided sciatica   [facility] Cathlean Marseilles A, NP   2 years ago Controlled type 2 diabetes mellitus without complication, without long-term current use of insulin (HCC)   [facility] Pickard, Priscille Heidelberg, MD   2 years ago COVID-19   [facility] Pickard, Priscille Heidelberg, MD       Future Appointments             In 7 months Pickard, Priscille Heidelberg, MD [facility]            Passed - Last BP in normal range    BP Readings from Last 1 Encounters:  11/11/22 126/82         Passed - Last Heart Rate in normal range    Pulse Readings from Last 1 Encounters:  11/11/22 98

## 2022-12-17 ENCOUNTER — Other Ambulatory Visit: Payer: Self-pay | Admitting: Family Medicine

## 2022-12-17 DIAGNOSIS — E1165 Type 2 diabetes mellitus with hyperglycemia: Secondary | ICD-10-CM

## 2022-12-19 NOTE — Telephone Encounter (Signed)
OV 11/03/22 Requested Prescriptions  Pending Prescriptions Disp Refills   OZEMPIC, 2 MG/DOSE, 8 MG/3ML SOPN [Pharmacy Med Name: OZEMPIC 8 MG/3 ML (2 MG/DOSE)]  1    Sig: INJECT 2 MG AS DIRECTED ONCE A WEEK.     Endocrinology:  Diabetes - GLP-1 Receptor Agonists - semaglutide Failed - 12/17/2022  1:49 PM      Failed - HBA1C in normal range and within 180 days    Hgb A1C (fingerstick)  Date Value Ref Range Status  03/01/2013 5.0 <5.7 % Final    Comment:                                                                           According to the ADA Clinical Practice Recommendations for 2011, when HbA1c is used as a screening test:     >=6.5%   Diagnostic of Diabetes Mellitus            (if abnormal result is confirmed)   5.7-6.4%   Increased risk of developing Diabetes Mellitus   References:Diagnosis and Classification of Diabetes Mellitus,Diabetes Care,2011,34(Suppl 1):S62-S69 and Standards of Medical Care in         Diabetes - 2011,Diabetes Care,2011,34 (Suppl 1):S11-S61.     Hgb A1c MFr Bld  Date Value Ref Range Status  10/31/2022 6.4 (H) <5.7 % of total Hgb Final    Comment:    For someone without known diabetes, a hemoglobin  A1c value between 5.7% and 6.4% is consistent with prediabetes and should be confirmed with a  follow-up test. . For someone with known diabetes, a value <7% indicates that their diabetes is well controlled. A1c targets should be individualized based on duration of diabetes, age, comorbid conditions, and other considerations. . This assay result is consistent with an increased risk of diabetes. . Currently, no consensus exists regarding use of hemoglobin A1c for diagnosis of diabetes for children. . . This test was performed on the Roche cobas c503 platform. Effective 05/09/22, a change in test platforms from the Abbott Architect to the Roche cobas c503 may have shifted HbA1c results compared to historical results. Based on laboratory validation  testing conducted at [facility], the Roche platform relative to the Abbott platform had an average increase in HbA1c value of < or = 0.3%. This difference is within accepted  variability established by the [facility]. Note that not all individuals will have had a shift in their results and direct comparisons between historical and current results for testing conducted on different platforms is not recommended.          Failed - Valid encounter within last 6 months    Recent Outpatient Visits           1 year ago Acute cystitis without hematuria   [facility] Tanya Nones Priscille Heidelberg, MD   1 year ago Fatigue, unspecified type   [facility] Donita Brooks, MD   2 years ago Acute midline low back pain with left-sided sciatica   [facility] Cathlean Marseilles A, NP   2 years ago Controlled type 2 diabetes mellitus without complication, without long-term current use of insulin (HCC)   [facility] [place],  Priscille Heidelberg, MD   2 years ago COVID-19   [facility] Pickard, Priscille Heidelberg, MD       Future Appointments             In 7 months Pickard, Priscille Heidelberg, MD [facility]            Passed - Cr in normal range and within 360 days    Creat  Date Value Ref Range Status  10/31/2022 0.73 0.50 - 0.99 mg/dL Final   Creatinine, Urine  Date Value Ref Range Status  07/21/2022 245 20 - 275 mg/dL Final

## 2022-12-27 ENCOUNTER — Encounter: Payer: Self-pay | Admitting: Family Medicine

## 2022-12-27 ENCOUNTER — Other Ambulatory Visit: Payer: Self-pay | Admitting: Family Medicine

## 2022-12-27 MED ORDER — CEPHALEXIN 500 MG PO CAPS
500.0000 mg | ORAL_CAPSULE | Freq: Three times a day (TID) | ORAL | 0 refills | Status: DC
Start: 2022-12-27 — End: 2023-01-13

## 2022-12-29 ENCOUNTER — Other Ambulatory Visit: Payer: Self-pay

## 2022-12-29 DIAGNOSIS — N898 Other specified noninflammatory disorders of vagina: Secondary | ICD-10-CM

## 2022-12-29 MED ORDER — FLUCONAZOLE 150 MG PO TABS
150.0000 mg | ORAL_TABLET | Freq: Once | ORAL | 0 refills | Status: DC
Start: 2022-12-29 — End: 2023-01-16

## 2023-01-13 ENCOUNTER — Ambulatory Visit
Admission: RE | Admit: 2023-01-13 | Discharge: 2023-01-13 | Disposition: A | Discharge status: Home / Self Care | Payer: BC Managed Care – PPO | Source: Ambulatory Visit | Attending: Urgent Care | Admitting: Urgent Care

## 2023-01-13 VITALS — BP 144/83 | HR 99 | Temp 98.3°F | Resp 18

## 2023-01-13 DIAGNOSIS — J019 Acute sinusitis, unspecified: Secondary | ICD-10-CM

## 2023-01-13 DIAGNOSIS — B9689 Other specified bacterial agents as the cause of diseases classified elsewhere: Secondary | ICD-10-CM

## 2023-01-13 DIAGNOSIS — J4530 Mild persistent asthma, uncomplicated: Secondary | ICD-10-CM | POA: Diagnosis not present

## 2023-01-13 DIAGNOSIS — J309 Allergic rhinitis, unspecified: Secondary | ICD-10-CM | POA: Diagnosis not present

## 2023-01-13 MED ORDER — PREDNISONE 20 MG PO TABS: 40.0000 mg | ORAL_TABLET | Freq: Every day | ORAL | 0 refills | Status: DC

## 2023-01-13 MED ORDER — PROMETHAZINE-DM 6.25-15 MG/5ML PO SYRP: 5.0000 mL | ORAL_SOLUTION | Freq: Three times a day (TID) | ORAL | 0 refills | Status: DC | PRN

## 2023-01-13 MED ORDER — AMOXICILLIN 875 MG PO TABS: 875.0000 mg | ORAL_TABLET | Freq: Two times a day (BID) | ORAL | 0 refills | Status: DC

## 2023-01-13 NOTE — Discharge Instructions (Addendum)
We will manage this as a sinus infection with amoxicillin. For sore throat or cough try using a honey-based tea. Use 3 teaspoons of honey with juice squeezed from half lemon. Place shaved pieces of ginger into 1/2-1 cup of water and warm over stove top. Then mix the ingredients and repeat every 4 hours as needed. Please take Tylenol 500mg -650mg  every 6 hours for throat pain, fevers, aches and pains. Hydrate very well with at least 2 liters of water. Eat light meals such as soups (chicken and noodles, vegetable, chicken and wild rice).  Do not eat foods that you are allergic to.  Taking an antihistamine like Zyrtec can help against postnasal drainage, sinus congestion which can cause sinus pain, sinus headaches, throat pain, painful swallowing, coughing.  You can take this together with prednisone and albuterol.  Use cough medication as needed. Do not use Flonase right now.

## 2023-01-13 NOTE — ED Provider Notes (Signed)
[facility]  Note:  This document was prepared using Conservation officer, historic buildings and may include unintentional dictation errors.  MRN: [id number] DOB: Jun 23, 1976  Subjective:   Candice Jones is a 47 y.o. female presenting for 8-9 day history of acute onset persistent and worsening throat pain, sinus congestion, bilateral ear fullness and pain.  Has nasal discharge that is murky brown.  Has started to cough more consistently and has chest pain with coughing.  In the past couple of days he started to develop moderate to severe dental pain, facial pain.  Has had pain around the ears externally extending down toward the neck on either side.  No shob, wheezing.  Has been using her albuterol inhaler in the past couple of days to help her.  Has a history of allergic rhinitis and is very consistent with her medications including Flonase, azelastine nasal spray, Xyzal.  She did a COVID test a couple of times last weekend and was negative.  No smoking of any kind including cigarettes, cigars, vaping, marijuana use.  Has type 2 diabetes that is well-controlled with an A1c less than 7%.  No current facility-administered medications for this encounter.  Current Outpatient Medications:    albuterol (VENTOLIN HFA) 108 (90 Base) MCG/ACT inhaler, Inhale 2 puffs into the lungs every 4 (four) hours as needed for wheezing or shortness of breath., Disp: 18 g, Rfl: 0   albuterol (VENTOLIN HFA) 108 (90 Base) MCG/ACT inhaler, INHALE 1-2 PUFFS BY MOUTH EVERY 6 HOURS AS NEEDED FOR WHEEZE OR SHORTNESS OF BREATH, Disp: 8.5 each, Rfl: 5   APPLE CIDER VINEGAR PO, Take by mouth. Gummies, includes b6 and b12. Has been taking x4 months., Disp: , Rfl:    Ascorbic Acid (VITAMIN C PO), Take 1 tablet by mouth every morning., Disp: , Rfl:    Azelastine HCl 137 MCG/SPRAY SOLN, Place 2 sprays into both nostrils every morning., Disp: , Rfl:    Blood Glucose Monitoring Suppl KIT, Use to check fasting glucose  daily ICD10: R73.01.Dispense based on insurance preference, Disp: 1 kit, Rfl: 0   Cholecalciferol (VITAMIN D3 PO), Take 1 tablet by mouth every morning., Disp: , Rfl:    Cranberry-Vitamin C-Vitamin E (CRANBERRY PLUS VITAMIN C) 4200-20-3 MG-MG-UNIT CAPS, Take 2 capsules by mouth every morning., Disp: , Rfl:    docusate sodium (COLACE) 100 MG capsule, Take 100 mg by mouth every morning., Disp: , Rfl:    Fluocinolone Acetonide Body (DERMA-SMOOTHE/FS BODY) 0.01 % OIL, Apply 1 Application topically 2 (two) times a week. (Patient not taking: Reported on 08/29/2022), Disp: 120 mL, Rfl: 1   fluticasone (FLONASE) 50 MCG/ACT nasal spray, SPRAY 2 SPRAYS INTO EACH NOSTRIL EVERY DAY (Patient taking differently: Place 2 sprays into both nostrils daily as needed for allergies or rhinitis.), Disp: 48 mL, Rfl: 1   glucose blood test strip, Use with glucose meter to check blood sugar.ICD10: R73.01.Dispense based on insurance preference, Disp: 100 each, Rfl: 12   Lancets (FREESTYLE) lancets, Use to check blood sugar ICD10: R73.01.Dispense based on insurance preference, Disp: 100 each, Rfl: 12   levocetirizine (XYZAL) 5 MG tablet, Take 5 mg by mouth every morning., Disp: , Rfl:    losartan (COZAAR) 25 MG tablet, TAKE 1 TABLET (25 MG TOTAL) BY MOUTH DAILY., Disp: 90 tablet, Rfl: 3   metFORMIN (GLUCOPHAGE-XR) 500 MG 24 hr tablet, TAKE 2 TABLETS BY MOUTH EVERY DAY WITH BREAKFAST (Patient taking differently: Per pt, stated that per Dr. Tanya Nones, pt to take  4 every day in the morning w/breakfast), Disp: 180 tablet, Rfl: 3   metoprolol succinate (TOPROL-XL) 50 MG 24 hr tablet, TAKE 1 TABLET BY MOUTH DAILY. TAKE WITH OR IMMEDIATELY FOLLOWING A MEAL., Disp: 90 tablet, Rfl: 1   Misc Natural Products (ELDERBERRY ZINC/VIT C/IMMUNE MT), Use as directed in the mouth or throat., Disp: , Rfl:    Multiple Vitamin (MULTIVITAMIN WITH MINERALS) TABS tablet, Take 1 tablet by mouth every morning., Disp: , Rfl:    Multiple Vitamins-Minerals  (IMMUNE SUPPORT) CHEW, Chew 2 tablets by mouth every morning., Disp: , Rfl:    norethindrone-ethinyl estradiol (LOESTRIN) 1-20 MG-MCG tablet, Take 1 tablet by mouth every morning., Disp: , Rfl:    pantoprazole (PROTONIX) 40 MG tablet, TAKE 1 TABLET BY MOUTH TWICE A DAY, Disp: 180 tablet, Rfl: 1   PEPPERMINT OIL PO, Take 1 capsule by mouth every morning., Disp: , Rfl:    rosuvastatin (CRESTOR) 10 MG tablet, Take 1 tablet (10 mg total) by mouth daily., Disp: 90 tablet, Rfl: 3   Semaglutide, 2 MG/DOSE, (OZEMPIC, 2 MG/DOSE,) 8 MG/3ML SOPN, INJECT 2 MG AS DIRECTED ONCE A WEEK., Disp: 3 mL, Rfl: 1   VITAMIN A PO, Take 1 capsule by mouth every morning., Disp: , Rfl:    Allergies  Allergen Reactions   Augmentin [Amoxicillin-Pot Clavulanate] Nausea And Vomiting    Severe nausea/vomiting    Codeine Other (See Comments)    Makes pain worse   Ciprofloxacin Hcl Rash   Erythromycin Rash   Sulfa Antibiotics Rash    Past Medical History:  Diagnosis Date   Adenomyosis    Asthma    COVID-19    Diabetes (HCC)    Gall stones    Gestational diabetes mellitus    Hiatal hernia    Hyperlipidemia    IBS (irritable bowel syndrome)    Internal hemorrhoids    Kidney stones    Meniere's syndrome    Vertigo      Past Surgical History:  Procedure Laterality Date   CESAREAN SECTION  08/1999, 05/2003   CHOLECYSTECTOMY  2011   CYSTECTOMY  1997   left wrist    ENDOMETRIAL ABLATION  01/2010   HEMORRHOID BANDING  2015   MINOR HEMORRHOIDECTOMY  04/2014   thrombosed   WISDOM TOOTH EXTRACTION  1996/1997    Family History  Problem Relation Age of Onset   High Cholesterol Mother    Hypertension Mother    Heart disease Mother    Diabetes Father    High Cholesterol Father    Colon cancer Sister        negative for Lynch syndrome   Diverticulitis Sister    Diabetes Maternal Grandmother    Heart disease Maternal Grandmother    Hypertension Maternal Grandmother    High Cholesterol Maternal Grandmother     Kidney cancer Maternal Grandmother    Heart disease Maternal Grandfather    High Cholesterol Maternal Grandfather    Cancer Maternal Grandfather    Diabetes Paternal Grandmother    Prostate cancer Paternal Grandfather    Lupus Sister     Social History   Tobacco Use   Smoking status: Never   Smokeless tobacco: Never  Vaping Use   Vaping Use: Never used  Substance Use Topics   Alcohol use: Yes    Comment: rarely   Drug use: No    ROS   Objective:   Vitals: BP (!) 144/83 (BP Location: Right Arm)   Pulse 99   Temp 98.3 F (  36.8 C) (Oral)   Resp 18   LMP  (LMP Unknown)   SpO2 98%   Physical Exam Constitutional:      General: She is not in acute distress.    Appearance: Normal appearance. She is well-developed and normal weight. She is not ill-appearing, toxic-appearing or diaphoretic.  HENT:     Head: Normocephalic and atraumatic.     Right Ear: Tympanic membrane, ear canal and external ear normal. No drainage or tenderness. No middle ear effusion. There is no impacted cerumen. Tympanic membrane is not erythematous or bulging.     Left Ear: Tympanic membrane, ear canal and external ear normal. No drainage or tenderness.  No middle ear effusion. There is no impacted cerumen. Tympanic membrane is not erythematous or bulging.     Nose: Mucosal edema, congestion and rhinorrhea present.     Right Sinus: Maxillary sinus tenderness present.     Left Sinus: Maxillary sinus tenderness present.     Mouth/Throat:     Mouth: Mucous membranes are moist. No oral lesions.     Pharynx: No pharyngeal swelling, oropharyngeal exudate, posterior oropharyngeal erythema or uvula swelling.     Tonsils: No tonsillar exudate or tonsillar abscesses.  Eyes:     General: No scleral icterus.       Right eye: No discharge.        Left eye: No discharge.     Extraocular Movements: Extraocular movements intact.     Right eye: Normal extraocular motion.     Left eye: Normal extraocular motion.      Conjunctiva/sclera: Conjunctivae normal.  Cardiovascular:     Rate and Rhythm: Normal rate and regular rhythm.     Heart sounds: Normal heart sounds. No murmur heard.    No friction rub. No gallop.  Pulmonary:     Effort: Pulmonary effort is normal. No respiratory distress.     Breath sounds: No stridor. No wheezing, rhonchi or rales.  Chest:     Chest wall: No tenderness.  Musculoskeletal:     Cervical back: Normal range of motion and neck supple.  Lymphadenopathy:     Cervical: No cervical adenopathy.  Skin:    General: Skin is warm and dry.  Neurological:     General: No focal deficit present.     Mental Status: She is alert and oriented to person, place, and time.  Psychiatric:        Mood and Affect: Mood normal.        Behavior: Behavior normal.     Assessment and Plan :   PDMP not reviewed this encounter.  1. Acute bacterial sinusitis   2. Allergic rhinitis, unspecified seasonality, unspecified trigger   3. Mild persistent asthma, uncomplicated    Deferred imaging given clear cardiopulmonary exam, hemodynamically stable vital signs. Will start empiric treatment for sinusitis with amoxicillin which she can tolerate as opposed to the Augmentin.  Given her respiratory symptoms, allergic rhinitis and asthma recommended an oral prednisone course.  Maintain albuterol inhaler.  Recommended supportive care otherwise. Counseled patient on potential for adverse effects with medications prescribed/recommended today, ER and return-to-clinic precautions discussed, patient verbalized understanding.    Wallis Bamberg, PA-C 01/13/23 1009

## 2023-01-13 NOTE — ED Triage Notes (Signed)
P[t reports she had a sore throat, nasal congestion, bilateral ear pain, and coughing x 1 week.  Took  mucinex, dimatap, dayquil and nyquil but only slight relief.

## 2023-01-14 ENCOUNTER — Other Ambulatory Visit: Payer: Self-pay | Admitting: Family Medicine

## 2023-01-14 DIAGNOSIS — N898 Other specified noninflammatory disorders of vagina: Secondary | ICD-10-CM

## 2023-01-16 NOTE — Telephone Encounter (Signed)
Requested medications are due for refill today.  unsure  Requested medications are on the active medications list.  no  Last refill. 2 weeks ago  Future visit scheduled.   yes  Notes to clinic.  Med not on med list. No protocol for this medication.    Requested Prescriptions  Pending Prescriptions Disp Refills   fluconazole (DIFLUCAN) 150 MG tablet [Pharmacy Med Name: FLUCONAZOLE 150 MG TABLET] 1 tablet 0    Sig: Take 1 tablet (150 mg total) by mouth once for 1 dose.     Off-Protocol Failed - 01/14/2023  6:30 PM      Failed - Medication not assigned to a protocol, review manually.      Failed - Valid encounter within last 12 months    Recent Outpatient Visits           1 year ago Acute cystitis without hematuria   [facility] Tanya Nones, Priscille Heidelberg, MD   1 year ago Fatigue, unspecified type   [facility] Donita Brooks, MD   2 years ago Acute midline low back pain with left-sided sciatica   [facility] Valentino Nose, NP   2 years ago Controlled type 2 diabetes mellitus without complication, without long-term current use of insulin (HCC)   [facility] Pickard, Priscille Heidelberg, MD   2 years ago COVID-19   [facility] Pickard, Priscille Heidelberg, MD       Future Appointments             In 6 months Pickard, Priscille Heidelberg, MD [facility]

## 2023-01-21 ENCOUNTER — Ambulatory Visit
Admission: EM | Admit: 2023-01-21 | Discharge: 2023-01-21 | Disposition: A | Discharge status: Home / Self Care | Payer: BC Managed Care – PPO | Attending: Nurse Practitioner | Admitting: Nurse Practitioner

## 2023-01-21 DIAGNOSIS — H5789 Other specified disorders of eye and adnexa: Secondary | ICD-10-CM

## 2023-01-21 DIAGNOSIS — R591 Generalized enlarged lymph nodes: Secondary | ICD-10-CM

## 2023-01-21 MED ORDER — PATADAY 0.7 % OP SOLN: OPHTHALMIC | 0 refills | Status: AC

## 2023-01-21 NOTE — ED Provider Notes (Signed)
[facility]    CSN: [id number] Arrival date & time: 01/21/23  4098      History   Chief Complaint No chief complaint on file.   HPI Candice Jones is a 47 y.o. female.   The history is provided by the patient.   The patient presents for complaints of left eye irritation, headache, and left ear pain.  Patient was seen in this clinic on 01/13/2023 and treated for bacterial sinusitis.  She was prescribed amoxicillin.  Patient completed the course of the medication.  Patient states symptoms subsequently improved, but over the last 2 to 3 days, she developed new symptoms.  She also states that she has swelling in the lymph nodes on the left side of her neck.  She denies any fever, chills, nasal drainage, ear drainage, cough, chest pain, abdominal pain, nausea, vomiting, or diarrhea.  Patient reports underlying history of seasonal allergies.  She states in the past, she was told by previous physician that she did have swelling in the lymph nodes on the same side.  She states she tried over-the-counter Visine with minimal relief. Past Medical History:  Diagnosis Date   Adenomyosis    Asthma    COVID-19    Diabetes (HCC)    Gall stones    Gestational diabetes mellitus    Hiatal hernia    Hyperlipidemia    IBS (irritable bowel syndrome)    Internal hemorrhoids    Kidney stones    Meniere's syndrome    Vertigo     Patient Active Problem List   Diagnosis Date Noted   Viral URI 08/29/2022   Family history of colon cancer - sister < 60 02/02/2017   Internal hemorrhoids with bleeding and prolapse 06/24/2014   IBS (irritable bowel syndrome) 06/24/2014   Endometriosis 03/03/2013   Elevated fasting glucose 03/01/2013   Gallstones 05/12/2011    Past Surgical History:  Procedure Laterality Date   CESAREAN SECTION  08/1999, 05/2003   CHOLECYSTECTOMY  2011   CYSTECTOMY  1997   left wrist    ENDOMETRIAL ABLATION  01/2010   HEMORRHOID BANDING  2015   MINOR HEMORRHOIDECTOMY   04/2014   thrombosed   WISDOM TOOTH EXTRACTION  1996/1997    OB History   No obstetric history on file.      Home Medications    Prior to Admission medications   Medication Sig Start Date End Date Taking? Authorizing Provider  Olopatadine HCl (PATADAY) 0.7 % SOLN Apply 1 drop twice daily to the left eye as needed for redness and itching. 01/21/23  Yes Farron Lafond-Warren, Sadie Haber, NP  albuterol (VENTOLIN HFA) 108 (90 Base) MCG/ACT inhaler Inhale 2 puffs into the lungs every 4 (four) hours as needed for wheezing or shortness of breath. 02/11/20   Elmore Guise, FNP  albuterol (VENTOLIN HFA) 108 (90 Base) MCG/ACT inhaler INHALE 1-2 PUFFS BY MOUTH EVERY 6 HOURS AS NEEDED FOR WHEEZE OR SHORTNESS OF BREATH 10/03/22   Donita Brooks, MD  amoxicillin (AMOXIL) 875 MG tablet Take 1 tablet (875 mg total) by mouth 2 (two) times daily. 01/13/23   Wallis Bamberg, PA-C  APPLE CIDER VINEGAR PO Take by mouth. Gummies, includes b6 and b12. Has been taking x4 months.    [provider]  Ascorbic Acid (VITAMIN C PO) Take 1 tablet by mouth every morning.    [provider]  Azelastine HCl 137 MCG/SPRAY SOLN Place 2 sprays into both nostrils every morning. 04/13/21   [provider]  Blood Glucose Monitoring Suppl KIT Use to check fasting glucose daily ICD10: R73.01.Dispense based on insurance preference 07/18/22   Donita Brooks, MD  Cholecalciferol (VITAMIN D3 PO) Take 1 tablet by mouth every morning.    [provider]  Cranberry-Vitamin C-Vitamin E (CRANBERRY PLUS VITAMIN C) 4200-20-3 MG-MG-UNIT CAPS Take 2 capsules by mouth every morning.    [provider]  docusate sodium (COLACE) 100 MG capsule Take 100 mg by mouth every morning.    [provider]  Fluocinolone Acetonide Body (DERMA-SMOOTHE/FS BODY) 0.01 % OIL Apply 1 Application topically 2 (two) times a week. Patient not taking: Reported on 08/29/2022 07/11/22   Donita Brooks, MD  fluticasone  [facility]) 50 MCG/ACT nasal spray SPRAY 2 SPRAYS INTO EACH NOSTRIL EVERY DAY Patient taking differently: Place 2 sprays into both nostrils daily as needed for allergies or rhinitis. 03/19/20   Lawson Fiscal A, FNP  glucose blood test strip Use with glucose meter to check blood sugar.ICD10: R73.01.Dispense based on insurance preference 09/28/22   Donita Brooks, MD  Lancets (FREESTYLE) lancets Use to check blood sugar ICD10: R73.01.Dispense based on insurance preference 02/09/18   Donita Brooks, MD  levocetirizine (XYZAL) 5 MG tablet Take 5 mg by mouth every morning.    [provider]  losartan (COZAAR) 25 MG tablet TAKE 1 TABLET (25 MG TOTAL) BY MOUTH DAILY. 03/14/22   Donita Brooks, MD  metFORMIN (GLUCOPHAGE-XR) 500 MG 24 hr tablet TAKE 2 TABLETS BY MOUTH EVERY DAY WITH BREAKFAST Patient taking differently: Per pt, stated that per Dr. Tanya Nones, pt to take 4 every day in the morning w/breakfast 07/04/22   Donita Brooks, MD  metoprolol succinate (TOPROL-XL) 50 MG 24 hr tablet TAKE 1 TABLET BY MOUTH DAILY. TAKE WITH OR IMMEDIATELY FOLLOWING A MEAL. 12/12/22   Donita Brooks, MD  Misc Natural Products (ELDERBERRY ZINC/VIT C/IMMUNE MT) Use as directed in the mouth or throat.    [provider]  Multiple Vitamin (MULTIVITAMIN WITH MINERALS) TABS tablet Take 1 tablet by mouth every morning.    [provider]  Multiple Vitamins-Minerals (IMMUNE SUPPORT) CHEW Chew 2 tablets by mouth every morning.    [provider]  norethindrone-ethinyl estradiol (LOESTRIN) 1-20 MG-MCG tablet Take 1 tablet by mouth every morning.    [provider]  pantoprazole (PROTONIX) 40 MG tablet TAKE 1 TABLET BY MOUTH TWICE A DAY 09/20/22   Donita Brooks, MD  PEPPERMINT OIL PO Take 1 capsule by mouth every morning.    [provider]  predniSONE (DELTASONE) 20 MG tablet Take 2 tablets (40 mg total) by mouth daily with breakfast. 01/13/23   Wallis Bamberg, PA-C   promethazine-dextromethorphan (PROMETHAZINE-DM) 6.25-15 MG/5ML syrup Take 5 mLs by mouth 3 (three) times daily as needed for cough. 01/13/23   Wallis Bamberg, PA-C  rosuvastatin (CRESTOR) 10 MG tablet Take 1 tablet (10 mg total) by mouth daily. 07/22/22   Donita Brooks, MD  Semaglutide, 2 MG/DOSE, (OZEMPIC, 2 MG/DOSE,) 8 MG/3ML SOPN INJECT 2 MG AS DIRECTED ONCE A WEEK. 12/19/22   Donita Brooks, MD  VITAMIN A PO Take 1 capsule by mouth every morning.    [provider]    Family History Family History  Problem Relation Age of Onset   High Cholesterol Mother    Hypertension Mother    Heart disease Mother    Diabetes Father    High Cholesterol Father    Colon cancer Sister  negative for Lynch syndrome   Diverticulitis Sister    Diabetes Maternal Grandmother    Heart disease Maternal Grandmother    Hypertension Maternal Grandmother    High Cholesterol Maternal Grandmother    Kidney cancer Maternal Grandmother    Heart disease Maternal Grandfather    High Cholesterol Maternal Grandfather    Cancer Maternal Grandfather    Diabetes Paternal Grandmother    Prostate cancer Paternal Grandfather    Lupus Sister     Social History Social History   Tobacco Use   Smoking status: Never   Smokeless tobacco: Never  Vaping Use   Vaping Use: Never used  Substance Use Topics   Alcohol use: Yes    Comment: rarely   Drug use: No     Allergies   Augmentin [amoxicillin-pot clavulanate], Codeine, Ciprofloxacin hcl, Erythromycin, and Sulfa antibiotics   Review of Systems Review of Systems Per HPI  Physical Exam Triage Vital Signs ED Triage Vitals  Enc Vitals Group     BP 01/21/23 0907 126/71     Pulse Rate 01/21/23 0907 96     Resp 01/21/23 0907 14     Temp 01/21/23 0907 98.6 F (37 C)     Temp Source 01/21/23 0907 Oral     SpO2 01/21/23 0907 97 %     Weight --      Height --      Head Circumference --      Peak Flow --      Pain Score 01/21/23 0911 7      Pain Loc --      Pain Edu? --      Excl. in GC? --    No data found.  Updated Vital Signs BP 126/71 (BP Location: Right Arm)   Pulse 96   Temp 98.6 F (37 C) (Oral)   Resp 14   LMP  (LMP Unknown) Comment: pt does not have cycle with oral contraceptives  SpO2 97%   Visual Acuity Right Eye Distance:   Left Eye Distance:   Bilateral Distance:    Right Eye Near:   Left Eye Near:    Bilateral Near:     Physical Exam Vitals and nursing note reviewed.  Constitutional:      General: She is not in acute distress.    Appearance: Normal appearance.  HENT:     Head: Normocephalic.     Right Ear: Tympanic membrane, ear canal and external ear normal.     Left Ear: Tympanic membrane, ear canal and external ear normal.     Nose:     Right Turbinates: Enlarged and swollen.     Left Turbinates: Enlarged and swollen.     Right Sinus: No maxillary sinus tenderness or frontal sinus tenderness.     Left Sinus: Maxillary sinus tenderness and frontal sinus tenderness present.     Mouth/Throat:     Lips: Pink.     Mouth: Mucous membranes are moist.     Pharynx: Oropharynx is clear. Uvula midline. No pharyngeal swelling or posterior oropharyngeal erythema.  Eyes:     General: Lids are normal. Vision grossly intact. No visual field deficit.       Left eye: No foreign body, discharge or hordeolum.     Extraocular Movements: Extraocular movements intact.     Left eye: Normal extraocular motion and no nystagmus.     Conjunctiva/sclera:     Left eye: Left conjunctiva is not injected. No chemosis, exudate or hemorrhage.  Pupils: Pupils are equal, round, and reactive to light.     Comments: Conjunctiva of the left eye is erythematous  Cardiovascular:     Rate and Rhythm: Normal rate and regular rhythm.  Pulmonary:     Effort: Pulmonary effort is normal. No respiratory distress.     Breath sounds: Normal breath sounds. No wheezing or rales.  Abdominal:     General: Bowel sounds are normal.      Palpations: Abdomen is soft.     Tenderness: There is no abdominal tenderness.  Musculoskeletal:     Cervical back: Normal range of motion.  Lymphadenopathy:     Upper Body:     Left upper body: Supraclavicular adenopathy present.  Skin:    General: Skin is warm and dry.  Neurological:     General: No focal deficit present.     Mental Status: She is alert and oriented to person, place, and time.  Psychiatric:        Mood and Affect: Mood normal.        Behavior: Behavior normal.      UC Treatments / Results  Labs (all labs ordered are listed, but only abnormal results are displayed) Labs Reviewed - No data to display  EKG   Radiology No results found.  Procedures Procedures (including critical care time)  Medications Ordered in UC Medications - No data to display  Initial Impression / Assessment and Plan / UC Course  I have reviewed the triage vital signs and the nursing notes.  Pertinent labs & imaging results that were available during my care of the patient were reviewed by me and considered in my medical decision making (see chart for details).  The patient is well-appearing, she is in no acute distress, vital signs are stable.  Patient with irritation of the left eye.  Will treat empirically for allergic conjunctivitis.  With regard to her lymphadenopathy.  Supportive care recommendations were provided and discussed with the patient to include use of warm compresses, and over-the-counter analgesics for pain or discomfort.  Patient was given indications of when follow-up may be necessary, patient was advised if symptoms do not improve, follow-up is recommended with her primary care physician. Patient was in agreement with this plan of care and verbalizes understanding.  All questions were answered.  Patient stable for discharge.  Final Clinical Impressions(s) / UC Diagnoses   Final diagnoses:  Irritation of left eye  Lymphadenopathy     Discharge  Instructions      Apply eyedrops as prescribed. May take over-the-counter ibuprofen or Tylenol as needed for pain, fever, general discomfort. Apply warm compresses to the left ear/neck as needed for pain or discomfort. Continue to monitor the area for worsening swelling or pain.  If you develop worsening symptoms, please follow-up with your primary care physician for further evaluation. Follow-up as needed.     ED Prescriptions     Medication Sig Dispense Auth. Provider   Olopatadine HCl (PATADAY) 0.7 % SOLN Apply 1 drop twice daily to the left eye as needed for redness and itching. 5 mL Elnoria Livingston-Warren, Sadie Haber, NP      PDMP not reviewed this encounter.   Abran Cantor, NP 01/22/23 [phone]

## 2023-01-21 NOTE — Discharge Instructions (Addendum)
Apply eyedrops as prescribed. May take over-the-counter ibuprofen or Tylenol as needed for pain, fever, general discomfort. Apply warm compresses to the left ear/neck as needed for pain or discomfort. Continue to monitor the area for worsening swelling or pain.  If you develop worsening symptoms, please follow-up with your primary care physician for further evaluation. Follow-up as needed.

## 2023-01-21 NOTE — ED Triage Notes (Signed)
Pt c/o ear pain, pt was seen in clinic on 01/13/2023 for bilateral ear pain, sore throat cough, dx'x was acute bacterial sinusitis. Was given antibiotic Pt back today with eye irritation to the left eye, headache, and ear pain.

## 2023-02-01 ENCOUNTER — Other Ambulatory Visit: Payer: Self-pay | Admitting: Family Medicine

## 2023-02-03 ENCOUNTER — Other Ambulatory Visit: Payer: Self-pay | Admitting: Family Medicine

## 2023-02-03 DIAGNOSIS — E1165 Type 2 diabetes mellitus with hyperglycemia: Secondary | ICD-10-CM

## 2023-03-01 ENCOUNTER — Other Ambulatory Visit: Payer: Self-pay | Admitting: Obstetrics and Gynecology

## 2023-03-01 DIAGNOSIS — Z8249 Family history of ischemic heart disease and other diseases of the circulatory system: Secondary | ICD-10-CM

## 2023-03-03 ENCOUNTER — Other Ambulatory Visit: Payer: Self-pay | Admitting: Family Medicine

## 2023-03-06 NOTE — Telephone Encounter (Signed)
Requested Prescriptions  Pending Prescriptions Disp Refills   pantoprazole (PROTONIX) 40 MG tablet [Pharmacy Med Name: PANTOPRAZOLE SOD DR 40 MG TAB] 180 tablet 0    Sig: TAKE 1 TABLET BY MOUTH TWICE A DAY     Gastroenterology: Proton Pump Inhibitors Failed - 03/03/2023  8:24 PM      Failed - Valid encounter within last 12 months    Recent Outpatient Visits           1 year ago Acute cystitis without hematuria   [facility] Donita Brooks, MD   1 year ago Fatigue, unspecified type   [facility] Medicine Donita Brooks, MD   2 years ago Acute midline low back pain with left-sided sciatica   [facility] Medicine Cathlean Marseilles A, NP   2 years ago Controlled type 2 diabetes mellitus without complication, without long-term current use of insulin (HCC)   [facility] Pickard, Priscille Heidelberg, MD   2 years ago COVID-19   [facility] Medicine Pickard, Priscille Heidelberg, MD       Future Appointments             In 4 months Pickard, Priscille Heidelberg, MD [facility], [facility]

## 2023-03-13 ENCOUNTER — Ambulatory Visit
Admission: RE | Admit: 2023-03-13 | Discharge: 2023-03-13 | Disposition: A | Discharge status: Home / Self Care | Payer: BC Managed Care – PPO | Source: Ambulatory Visit | Attending: Obstetrics and Gynecology | Admitting: Obstetrics and Gynecology

## 2023-03-13 DIAGNOSIS — Z8249 Family history of ischemic heart disease and other diseases of the circulatory system: Secondary | ICD-10-CM | POA: Insufficient documentation

## 2023-03-20 ENCOUNTER — Other Ambulatory Visit: Payer: Self-pay | Admitting: Family Medicine

## 2023-03-22 ENCOUNTER — Encounter: Payer: Self-pay | Admitting: Family Medicine

## 2023-03-22 NOTE — Telephone Encounter (Signed)
Last OV 07/22/23 Requested Prescriptions  Pending Prescriptions Disp Refills   albuterol (VENTOLIN HFA) 108 (90 Base) MCG/ACT inhaler [Pharmacy Med Name: ALBUTEROL HFA (PROAIR) INHALER] 8.5 each 5    Sig: INHALE 1-2 PUFFS BY MOUTH EVERY 6 HOURS AS NEEDED FOR WHEEZE OR SHORTNESS OF BREATH     Pulmonology:  Beta Agonists 2 Failed - 03/21/2023  9:45 AM      Failed - Valid encounter within last 12 months    Recent Outpatient Visits           1 year ago Acute cystitis without hematuria   [facility] Medicine Donita Brooks, MD   1 year ago Fatigue, unspecified type   [facility] Medicine Donita Brooks, MD   2 years ago Acute midline low back pain with left-sided sciatica   [facility] Medicine Cathlean Marseilles A, NP   2 years ago Controlled type 2 diabetes mellitus without complication, without long-term current use of insulin (HCC)   [facility] Pickard, Priscille Heidelberg, MD   2 years ago COVID-19   [facility] Medicine Pickard, Priscille Heidelberg, MD       Future Appointments             In 4 months Pickard, Priscille Heidelberg, MD [facility], [facility]            Passed - Last BP in normal range    BP Readings from Last 1 Encounters:  01/21/23 126/71         Passed - Last Heart Rate in normal range    Pulse Readings from Last 1 Encounters:  01/21/23 96

## 2023-04-10 ENCOUNTER — Other Ambulatory Visit: Payer: Self-pay | Admitting: Family Medicine

## 2023-04-10 ENCOUNTER — Encounter: Payer: Self-pay | Admitting: Family Medicine

## 2023-04-10 DIAGNOSIS — E1165 Type 2 diabetes mellitus with hyperglycemia: Secondary | ICD-10-CM

## 2023-04-10 MED ORDER — NIRMATRELVIR/RITONAVIR (PAXLOVID)TABLET: 3.0000 | ORAL_TABLET | Freq: Two times a day (BID) | ORAL | 0 refills | Status: AC

## 2023-04-11 NOTE — Telephone Encounter (Signed)
Requested Prescriptions  Pending Prescriptions Disp Refills   Semaglutide, 2 MG/DOSE, (OZEMPIC, 2 MG/DOSE,) 8 MG/3ML SOPN [Pharmacy Med Name: OZEMPIC 8 MG/3 ML (2 MG/DOSE)] 3 mL 1    Sig: INJECT 2 MG AS DIRECTED ONCE A WEEK.     Endocrinology:  Diabetes - GLP-1 Receptor Agonists - semaglutide Failed - 04/10/2023  9:03 AM      Failed - HBA1C in normal range and within 180 days    Hgb A1C (fingerstick)  Date Value Ref Range Status  03/01/2013 5.0 <5.7 % Final    Comment:                                                                           According to the ADA Clinical Practice Recommendations for 2011, when HbA1c is used as a screening test:     >=6.5%   Diagnostic of Diabetes Mellitus            (if abnormal result is confirmed)   5.7-6.4%   Increased risk of developing Diabetes Mellitus   References:Diagnosis and Classification of Diabetes Mellitus,Diabetes Care,2011,34(Suppl 1):S62-S69 and Standards of Medical Care in         Diabetes - 2011,Diabetes Care,2011,34 (Suppl 1):S11-S61.     Hgb A1c MFr Bld  Date Value Ref Range Status  10/31/2022 6.4 (H) <5.7 % of total Hgb Final    Comment:    For someone without known diabetes, a hemoglobin  A1c value between 5.7% and 6.4% is consistent with prediabetes and should be confirmed with a  follow-up test. . For someone with known diabetes, a value <7% indicates that their diabetes is well controlled. A1c targets should be individualized based on duration of diabetes, age, comorbid conditions, and other considerations. . This assay result is consistent with an increased risk of diabetes. . Currently, no consensus exists regarding use of hemoglobin A1c for diagnosis of diabetes for children. . . This test was performed on the Roche cobas c503 platform. Effective 05/09/22, a change in test platforms from the Abbott Architect to the Roche cobas c503 may have shifted HbA1c results compared to historical results. Based on  laboratory validation testing conducted at [facility], the Roche platform relative to the Abbott platform had an average increase in HbA1c value of < or = 0.3%. This difference is within accepted  variability established by the [facility]. Note that not all individuals will have had a shift in their results and direct comparisons between historical and current results for testing conducted on different platforms is not recommended.          Failed - Valid encounter within last 6 months    Recent Outpatient Visits           1 year ago Acute cystitis without hematuria   [facility] Medicine Tanya Nones Priscille Heidelberg, MD   1 year ago Fatigue, unspecified type   [facility] Medicine Donita Brooks, MD   2 years ago Acute midline low back pain with left-sided sciatica   [facility] Medicine Cathlean Marseilles A, NP   2 years ago Controlled type 2 diabetes mellitus without complication, without long-term current use of insulin (HCC)   [facility] Donita Brooks, MD   2 years ago COVID-19   [facility] Medicine Pickard, Priscille Heidelberg, MD       Future Appointments             In 3 months Pickard, Priscille Heidelberg, MD [facility], [facility]            Passed - Cr in normal range and within 360 days    Creat  Date Value Ref Range Status  10/31/2022 0.73 0.50 - 0.99 mg/dL Final   Creatinine, Urine  Date Value Ref Range Status  07/21/2022 245 20 - 275 mg/dL Final

## 2023-05-01 ENCOUNTER — Encounter: Payer: Self-pay | Admitting: Family Medicine

## 2023-05-02 ENCOUNTER — Other Ambulatory Visit: Payer: Self-pay | Admitting: Family Medicine

## 2023-05-02 MED ORDER — POLYMYXIN B-TRIMETHOPRIM 10000-0.1 UNIT/ML-% OP SOLN: 1.0000 [drp] | OPHTHALMIC | 0 refills | Status: DC

## 2023-05-19 ENCOUNTER — Other Ambulatory Visit: Payer: Self-pay

## 2023-05-19 ENCOUNTER — Telehealth: Payer: Self-pay

## 2023-05-19 DIAGNOSIS — K589 Irritable bowel syndrome without diarrhea: Secondary | ICD-10-CM

## 2023-05-19 DIAGNOSIS — K219 Gastro-esophageal reflux disease without esophagitis: Secondary | ICD-10-CM

## 2023-05-19 MED ORDER — PANTOPRAZOLE SODIUM 40 MG PO TBEC
40.0000 mg | DELAYED_RELEASE_TABLET | Freq: Two times a day (BID) | ORAL | 2 refills | Status: DC
Start: 2023-05-19 — End: 2024-01-05

## 2023-05-19 NOTE — Telephone Encounter (Signed)
Prescription Request  05/19/2023  LOV: 11/03/22  What is the name of the medication or equipment? pantoprazole (PROTONIX) 40 MG tablet [[id number]]  Have you contacted your pharmacy to request a refill? Yes   Which pharmacy would you like this sent to?  [id number] [place], [place] - [address] [place] [address] Phone: [phone] Fax: [phone]    Patient notified that their request is being sent to the clinical staff for review and that they should receive a response within 2 business days.   Please advise at [facility] [phone]

## 2023-05-24 ENCOUNTER — Other Ambulatory Visit: Payer: Self-pay | Admitting: Family Medicine

## 2023-05-24 DIAGNOSIS — R000 Tachycardia, unspecified: Secondary | ICD-10-CM

## 2023-05-26 NOTE — Telephone Encounter (Signed)
Requested Prescriptions  Pending Prescriptions Disp Refills   metoprolol succinate (TOPROL-XL) 50 MG 24 hr tablet [Pharmacy Med Name: METOPROLOL SUCC ER 50 MG TAB] 90 tablet 1    Sig: TAKE 1 TABLET BY MOUTH EVERY DAY WITH OR IMMEDIATELY FOLLOWING A MEAL     Cardiovascular:  Beta Blockers Failed - 05/24/2023  5:37 PM      Failed - Valid encounter within last 6 months    Recent Outpatient Visits           1 year ago Acute cystitis without hematuria   [facility] Medicine Donita Brooks, MD   1 year ago Fatigue, unspecified type   [facility] Medicine Donita Brooks, MD   2 years ago Acute midline low back pain with left-sided sciatica   [facility] Cathlean Marseilles A, NP   2 years ago Controlled type 2 diabetes mellitus without complication, without long-term current use of insulin (HCC)   [facility] Pickard, Priscille Heidelberg, MD   2 years ago COVID-19   [facility] Pickard, Priscille Heidelberg, MD       Future Appointments             In 1 week Azucena Cecil, Arlys John, MD [facility] [place]   In 2 months Pickard, Priscille Heidelberg, MD [facility], [facility]            Passed - Last BP in normal range    BP Readings from Last 1 Encounters:  01/21/23 126/71         Passed - Last Heart Rate in normal range    Pulse Readings from Last 1 Encounters:  01/21/23 96

## 2023-06-01 ENCOUNTER — Telehealth: Payer: Self-pay

## 2023-06-01 NOTE — Telephone Encounter (Signed)
Patient returned call and she states she has never seen a cardiologist in the past. She mentions that the only time she had any type of cardiac eval/testing was an echo she had at the office several years back.

## 2023-06-01 NOTE — Telephone Encounter (Signed)
The patient is scheduled to see Debbe Odea, MD on 06-02-2023 as a new patient. Called and left a voice message to inquire about any previous cardiac history. Requested the patient to call back at their earliest convenience.

## 2023-06-02 ENCOUNTER — Other Ambulatory Visit: Payer: Self-pay | Admitting: Family Medicine

## 2023-06-02 ENCOUNTER — Ambulatory Visit: Payer: BC Managed Care – PPO | Attending: Cardiology | Admitting: Cardiology

## 2023-06-02 ENCOUNTER — Encounter: Payer: Self-pay | Admitting: Cardiology

## 2023-06-02 VITALS — BP 115/62 | HR 67 | Ht 64.0 in | Wt 157.2 lb

## 2023-06-02 DIAGNOSIS — I2510 Atherosclerotic heart disease of native coronary artery without angina pectoris: Secondary | ICD-10-CM | POA: Diagnosis not present

## 2023-06-02 DIAGNOSIS — Z8249 Family history of ischemic heart disease and other diseases of the circulatory system: Secondary | ICD-10-CM

## 2023-06-02 DIAGNOSIS — I10 Essential (primary) hypertension: Secondary | ICD-10-CM

## 2023-06-02 DIAGNOSIS — E7800 Pure hypercholesterolemia, unspecified: Secondary | ICD-10-CM | POA: Diagnosis not present

## 2023-06-02 DIAGNOSIS — E1165 Type 2 diabetes mellitus with hyperglycemia: Secondary | ICD-10-CM

## 2023-06-02 NOTE — Progress Notes (Signed)
Cardiology Office Note:    Date:  06/02/2023   ID:  Candice Jones, DOB 1976/03/21, MRN [id number]  PCP:  Donita Brooks, MD   [facility] Providers Cardiologist:  Debbe Odea, MD     Referring MD: Donita Brooks, MD   Chief Complaint  Patient presents with   Establish Care    Medication reviewed verbally with patient   Candice Jones is a 47 y.o. female who is being seen today for the evaluation of coronary calcium at the request of Donita Brooks, MD.   History of Present Illness:    Candice Jones is a 47 y.o. female with a hx of hypertension, hyperlipidemia, diabetes, who presents due to coronary calcification.  Patient had a screening coronary calcium scan performed 03/13/2023, score of 5.95/91st percentile noted.  She denies chest pain or shortness of breath.  Only gets chest pain when she is sick such as having a COVID infection or an asthma attack.  Otherwise she feels well.  Blood pressure and cholesterol are adequately controlled on current medications.  Denies smoking.  Mother has HCM.  Past Medical History:  Diagnosis Date   Adenomyosis    Asthma    COVID-19    Diabetes (HCC)    Gall stones    Gestational diabetes mellitus    Hiatal hernia    Hyperlipidemia    IBS (irritable bowel syndrome)    Internal hemorrhoids    Kidney stones    Meniere's syndrome    Vertigo     Past Surgical History:  Procedure Laterality Date   CESAREAN SECTION  08/1999, 05/2003   CHOLECYSTECTOMY  2011   CYSTECTOMY  1997   left wrist    ENDOMETRIAL ABLATION  01/2010   HEMORRHOID BANDING  2015   MINOR HEMORRHOIDECTOMY  04/2014   thrombosed   WISDOM TOOTH EXTRACTION  1996/1997    Current Medications: Current Meds  Medication Sig   albuterol (VENTOLIN HFA) 108 (90 Base) MCG/ACT inhaler Inhale 2 puffs into the lungs every 4 (four) hours as needed for wheezing or shortness of breath.   albuterol (VENTOLIN HFA) 108 (90 Base) MCG/ACT inhaler INHALE 1-2 PUFFS BY MOUTH  EVERY 6 HOURS AS NEEDED FOR WHEEZE OR SHORTNESS OF BREATH   Ascorbic Acid (VITAMIN C PO) Take 1 tablet by mouth every morning.   Azelastine HCl 137 MCG/SPRAY SOLN Place 2 sprays into both nostrils every morning.   Blood Glucose Monitoring Suppl KIT Use to check fasting glucose daily ICD10: R73.01.Dispense based on insurance preference   Cholecalciferol (VITAMIN D3 PO) Take 1 tablet by mouth every morning.   Cranberry-Vitamin C-Vitamin E (CRANBERRY PLUS VITAMIN C) 4200-20-3 MG-MG-UNIT CAPS Take 2 capsules by mouth every morning.   docusate sodium (COLACE) 100 MG capsule Take 100 mg by mouth every morning.   fluticasone (FLONASE) 50 MCG/ACT nasal spray SPRAY 2 SPRAYS INTO EACH NOSTRIL EVERY DAY (Patient taking differently: Place 2 sprays into both nostrils daily as needed for allergies or rhinitis.)   glucose blood test strip Use with glucose meter to check blood sugar.ICD10: R73.01.Dispense based on insurance preference   Lancets (FREESTYLE) lancets Use to check blood sugar ICD10: R73.01.Dispense based on insurance preference   levocetirizine (XYZAL) 5 MG tablet Take 5 mg by mouth every morning.   losartan (COZAAR) 25 MG tablet TAKE 1 TABLET (25 MG TOTAL) BY MOUTH DAILY.   metFORMIN (GLUCOPHAGE-XR) 500 MG 24 hr tablet TAKE 2 TABLETS BY MOUTH EVERY DAY WITH BREAKFAST (Patient taking  differently: Per pt, stated that per Dr. Tanya Nones, pt to take 4 every day in the morning w/breakfast)   metoprolol succinate (TOPROL-XL) 50 MG 24 hr tablet TAKE 1 TABLET BY MOUTH EVERY DAY WITH OR IMMEDIATELY FOLLOWING A MEAL   Misc Natural Products (ELDERBERRY ZINC/VIT C/IMMUNE MT) Use as directed in the mouth or throat.   Multiple Vitamin (MULTIVITAMIN WITH MINERALS) TABS tablet Take 1 tablet by mouth every morning.   Multiple Vitamins-Minerals (IMMUNE SUPPORT) CHEW Chew 2 tablets by mouth every morning.   norethindrone-ethinyl estradiol (LOESTRIN) 1-20 MG-MCG tablet Take 1 tablet by mouth every morning.   Olopatadine  HCl (PATADAY) 0.7 % SOLN Apply 1 drop twice daily to the left eye as needed for redness and itching.   pantoprazole (PROTONIX) 40 MG tablet Take 1 tablet (40 mg total) by mouth 2 (two) times daily.   PEPPERMINT OIL PO Take 1 capsule by mouth every morning.   rosuvastatin (CRESTOR) 10 MG tablet Take 1 tablet (10 mg total) by mouth daily.   Semaglutide, 2 MG/DOSE, (OZEMPIC, 2 MG/DOSE,) 8 MG/3ML SOPN INJECT 2 MG AS DIRECTED ONCE A WEEK.   VITAMIN A PO Take 1 capsule by mouth every morning.   [DISCONTINUED] amoxicillin (AMOXIL) 875 MG tablet Take 1 tablet (875 mg total) by mouth 2 (two) times daily.   [DISCONTINUED] APPLE CIDER VINEGAR PO Take by mouth. Gummies, includes b6 and b12. Has been taking x4 months.   [DISCONTINUED] Fluocinolone Acetonide Body (DERMA-SMOOTHE/FS BODY) 0.01 % OIL Apply 1 Application topically 2 (two) times a week.   [DISCONTINUED] predniSONE (DELTASONE) 20 MG tablet Take 2 tablets (40 mg total) by mouth daily with breakfast.   [DISCONTINUED] promethazine-dextromethorphan (PROMETHAZINE-DM) 6.25-15 MG/5ML syrup Take 5 mLs by mouth 3 (three) times daily as needed for cough.   [DISCONTINUED] trimethoprim-polymyxin b (POLYTRIM) ophthalmic solution Place 1 drop into both eyes every 4 (four) hours.     Allergies:   Augmentin [amoxicillin-pot clavulanate], Codeine, Ciprofloxacin hcl, Erythromycin, and Sulfa antibiotics   Social History   Socioeconomic History   Marital status: Married    Spouse name: Not on file   Number of children: 3   Years of education: Not on file   Highest education level: Bachelor's degree (e.g., BA, AB, BS)  Occupational History   Occupation: Energy manager: [employer]  Tobacco Use   Smoking status: Never   Smokeless tobacco: Never  Vaping Use   Vaping status: Never Used  Substance and Sexual Activity   Alcohol use: Not Currently    Comment: rarely   Drug use: No   Sexual activity: Yes  Other Topics Concern   Not on  file  Social History Narrative   Married, 2 sons and 1 daughter   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   No caffeine alcohol tobacco or drugs   Social Determinants of Health   Financial Resource Strain: Low Risk  (10/31/2022)   Overall Financial Resource Strain (CARDIA)    Difficulty of Paying Living Expenses: Not hard at all  Food Insecurity: No Food Insecurity (10/31/2022)   Hunger Vital Sign    Worried About Running Out of Food in the Last Year: Never true    Ran Out of Food in the Last Year: Never true  Transportation Needs: No Transportation Needs (10/31/2022)   PRAPARE - Administrator, Civil Service (Medical): No    Lack of Transportation (Non-Medical): No  Physical Activity: Unknown (10/31/2022)   Exercise  Vital Sign    Days of Exercise per Week: 0 days    Minutes of Exercise per Session: Not on file  Stress: No Stress Concern Present (10/31/2022)   [employer] of Occupational Health - Occupational Stress Questionnaire    Feeling of Stress : Only a little  Social Connections: Socially Integrated (10/31/2022)   Social Connection and Isolation Panel [NHANES]    Frequency of Communication with Friends and Family: More than three times a week    Frequency of Social Gatherings with Friends and Family: Once a week    Attends Religious Services: More than 4 times per year    Active Member of [employer] or Organizations: Yes    Attends Engineer, structural: More than 4 times per year    Marital Status: Married     Family History: The patient's family history includes Cancer in her maternal grandfather; Colon cancer in her sister; Diabetes in her father, maternal grandmother, and paternal grandmother; Diverticulitis in her sister; Heart disease in her maternal grandfather, maternal grandmother, and mother; High Cholesterol in her father, maternal grandfather, maternal grandmother, and mother; Hypertension in her maternal grandmother and mother;  Hypertrophic cardiomyopathy in her mother; Kidney cancer in her maternal grandmother; Lupus in her sister; Prostate cancer in her paternal grandfather.  ROS:   Please see the history of present illness.     All other systems reviewed and are negative.  EKGs/Labs/Other Studies Reviewed:    The following studies were reviewed today:  EKG Interpretation Date/Time:  Friday June 02 2023 14:24:43 EDT Ventricular Rate:  67 PR Interval:  160 QRS Duration:  76 QT Interval:  372 QTC Calculation: 393 R Axis:   17  Text Interpretation: Normal sinus rhythm with sinus arrhythmia Normal ECG Confirmed by Debbe Odea ([id number]) on 06/02/2023 2:38:59 PM    Recent Labs: 10/31/2022: ALT 17; BUN 11; Creat 0.73; Hemoglobin 15.7; Platelets 395; Potassium 3.6; Sodium 141 11/03/2022: TSH 1.44  Recent Lipid Panel    Component Value Date/Time   CHOL 107 10/31/2022 0844   TRIG 107 10/31/2022 0844   HDL 38 (L) 10/31/2022 0844   CHOLHDL 2.8 10/31/2022 0844   LDLCALC 50 10/31/2022 0844     Risk Assessment/Calculations:             Physical Exam:    VS:  BP 115/62 (BP Location: Left Arm, Patient Position: Sitting, Cuff Size: Normal)   Pulse 67   Ht 5\' 4"  (1.626 m)   Wt 157 lb 3.2 oz (71.3 kg)   SpO2 99%   BMI 26.98 kg/m     Wt Readings from Last 3 Encounters:  06/02/23 157 lb 3.2 oz (71.3 kg)  11/11/22 183 lb (83 kg)  11/03/22 183 lb (83 kg)     GEN:  Well nourished, well developed in no acute distress HEENT: Normal NECK: No JVD; No carotid bruits CARDIAC: RRR, no murmurs, rubs, gallops RESPIRATORY:  Clear to auscultation without rales, wheezing or rhonchi  ABDOMEN: Soft, non-tender, non-distended MUSCULOSKELETAL:  No edema; No deformity  SKIN: Warm and dry NEUROLOGIC:  Alert and oriented x 3 PSYCHIATRIC:  Normal affect   ASSESSMENT:    1. Coronary artery calcification   2. Primary hypertension   3. Pure hypercholesterolemia   4. Family history of hypertrophic  cardiomyopathy    PLAN:    In order of problems listed above:  Coronary calcification, denies chest pain.  Continue aspirin, Crestor 10 mg daily.  LDL at goal.  Obtain echocardiogram.  Will  consider ischemic workup if patient becomes symptomatic or echo shows wall motion abnormalities. Hypertension, BP controlled, continue losartan 25 mg daily. Hyperlipidemia, cholesterol controlled, continue Crestor 10 mg daily. Mother has HCM, evaluate for HCM with echocardiogram as above.  Follow-up after echocardiogram.      Medication Adjustments/Labs and Tests Ordered: Current medicines are reviewed at length with the patient today.  Concerns regarding medicines are outlined above.  Orders Placed This Encounter  Procedures   EKG 12-Lead   ECHOCARDIOGRAM COMPLETE   No orders of the defined types were placed in this encounter.   Patient Instructions  Medication Instructions:   Your physician recommends that you continue on your current medications as directed. Please refer to the Current Medication list given to you today.  *If you need a refill on your cardiac medications before your next appointment, please call your pharmacy*   Lab Work:  None Ordered  If you have labs (blood work) drawn today and your tests are completely normal, you will receive your results only by: MyChart Message (if you have MyChart) OR A paper copy in the mail If you have any lab test that is abnormal or we need to change your treatment, we will call you to review the results.   Testing/Procedures:  Your physician has requested that you have an echocardiogram. Echocardiography is a painless test that uses sound waves to create images of your heart. It provides your doctor with information about the size and shape of your heart and how well your heart's chambers and valves are working. This procedure takes approximately one hour. There are no restrictions for this procedure. Please do NOT wear cologne,  perfume, aftershave, or lotions (deodorant is allowed). Please arrive 15 minutes prior to your appointment time.  Please note: We ask at that you not bring children with you during ultrasound (echo/ vascular) testing. Due to room size and safety concerns, children are not allowed in the ultrasound rooms during exams. Our front office staff cannot provide observation of children in our lobby area while testing is being conducted. An adult accompanying a patient to their appointment will only be allowed in the ultrasound room at the discretion of the ultrasound technician under special circumstances. We apologize for any inconvenience.    Follow-Up: At [facility], you and your health needs are our priority.  As part of our continuing mission to provide you with exceptional heart care, we have created designated Provider Care Teams.  These Care Teams include your primary Cardiologist (physician) and Advanced Practice Providers (APPs -  Physician Assistants and Nurse Practitioners) who all work together to provide you with the care you need, when you need it.  We recommend signing up for the patient portal called "MyChart".  Sign up information is provided on this After Visit Summary.  MyChart is used to connect with patients for Virtual Visits (Telemedicine).  Patients are able to view lab/test results, encounter notes, upcoming appointments, etc.  Non-urgent messages can be sent to your provider as well.   To learn more about what you can do with MyChart, go to [email or web address].    Your next appointment:    After Echocardiogram  Provider:   You may see Debbe Odea, MD or one of the following Advanced Practice Providers on your designated Care Team:   Nicolasa Ducking, NP Eula Listen, PA-C Cadence Fransico Michael, PA-C Charlsie Quest, NP Carlos Levering, NP \    Signed, Debbe Odea, MD  06/02/2023 3:35 PM    [facility]

## 2023-06-02 NOTE — Patient Instructions (Signed)
Medication Instructions:   Your physician recommends that you continue on your current medications as directed. Please refer to the Current Medication list given to you today.  *If you need a refill on your cardiac medications before your next appointment, please call your pharmacy*   Lab Work:  None Ordered  If you have labs (blood work) drawn today and your tests are completely normal, you will receive your results only by: MyChart Message (if you have MyChart) OR A paper copy in the mail If you have any lab test that is abnormal or we need to change your treatment, we will call you to review the results.   Testing/Procedures:  Your physician has requested that you have an echocardiogram. Echocardiography is a painless test that uses sound waves to create images of your heart. It provides your doctor with information about the size and shape of your heart and how well your heart's chambers and valves are working. This procedure takes approximately one hour. There are no restrictions for this procedure. Please do NOT wear cologne, perfume, aftershave, or lotions (deodorant is allowed). Please arrive 15 minutes prior to your appointment time.  Please note: We ask at that you not bring children with you during ultrasound (echo/ vascular) testing. Due to room size and safety concerns, children are not allowed in the ultrasound rooms during exams. Our front office staff cannot provide observation of children in our lobby area while testing is being conducted. An adult accompanying a patient to their appointment will only be allowed in the ultrasound room at the discretion of the ultrasound technician under special circumstances. We apologize for any inconvenience.    Follow-Up: At [facility], you and your health needs are our priority.  As part of our continuing mission to provide you with exceptional heart care, we have created designated Provider Care Teams.  These Care Teams  include your primary Cardiologist (physician) and Advanced Practice Providers (APPs -  Physician Assistants and Nurse Practitioners) who all work together to provide you with the care you need, when you need it.  We recommend signing up for the patient portal called "MyChart".  Sign up information is provided on this After Visit Summary.  MyChart is used to connect with patients for Virtual Visits (Telemedicine).  Patients are able to view lab/test results, encounter notes, upcoming appointments, etc.  Non-urgent messages can be sent to your provider as well.   To learn more about what you can do with MyChart, go to [email or web address].    Your next appointment:    After Echocardiogram  Provider:   You may see Debbe Odea, MD or one of the following Advanced Practice Providers on your designated Care Team:   Nicolasa Ducking, NP Eula Listen, PA-C Cadence Fransico Michael, PA-C Charlsie Quest, NP Carlos Levering, NP \

## 2023-06-05 NOTE — Telephone Encounter (Signed)
Requested Prescriptions  Pending Prescriptions Disp Refills   Semaglutide, 2 MG/DOSE, (OZEMPIC, 2 MG/DOSE,) 8 MG/3ML SOPN [Pharmacy Med Name: OZEMPIC 8 MG/3 ML (2 MG/DOSE)] 3 mL 1    Sig: INJECT 2 MG AS DIRECTED ONCE A WEEK.     Endocrinology:  Diabetes - GLP-1 Receptor Agonists - semaglutide Failed - 06/02/2023  9:22 PM      Failed - HBA1C in normal range and within 180 days    Hgb A1C (fingerstick)  Date Value Ref Range Status  03/01/2013 5.0 <5.7 % Final    Comment:                                                                           According to the ADA Clinical Practice Recommendations for 2011, when HbA1c is used as a screening test:     >=6.5%   Diagnostic of Diabetes Mellitus            (if abnormal result is confirmed)   5.7-6.4%   Increased risk of developing Diabetes Mellitus   References:Diagnosis and Classification of Diabetes Mellitus,Diabetes Care,2011,34(Suppl 1):S62-S69 and Standards of Medical Care in         Diabetes - 2011,Diabetes Care,2011,34 (Suppl 1):S11-S61.     Hgb A1c MFr Bld  Date Value Ref Range Status  10/31/2022 6.4 (H) <5.7 % of total Hgb Final    Comment:    For someone without known diabetes, a hemoglobin  A1c value between 5.7% and 6.4% is consistent with prediabetes and should be confirmed with a  follow-up test. . For someone with known diabetes, a value <7% indicates that their diabetes is well controlled. A1c targets should be individualized based on duration of diabetes, age, comorbid conditions, and other considerations. . This assay result is consistent with an increased risk of diabetes. . Currently, no consensus exists regarding use of hemoglobin A1c for diagnosis of diabetes for children. . . This test was performed on the Roche cobas c503 platform. Effective 05/09/22, a change in test platforms from the Abbott Architect to the Roche cobas c503 may have shifted HbA1c results compared to historical results. Based on  laboratory validation testing conducted at [facility], the Roche platform relative to the Abbott platform had an average increase in HbA1c value of < or = 0.3%. This difference is within accepted  variability established by the [facility]. Note that not all individuals will have had a shift in their results and direct comparisons between historical and current results for testing conducted on different platforms is not recommended.          Failed - Valid encounter within last 6 months    Recent Outpatient Visits           1 year ago Acute cystitis without hematuria   [facility] Tanya Nones Priscille Heidelberg, MD   1 year ago Fatigue, unspecified type   [facility] Donita Brooks, MD   2 years ago Acute midline low back pain with left-sided sciatica   [facility] Cathlean Marseilles A, NP   2 years ago Controlled type 2 diabetes mellitus without complication, without long-term current use of insulin (HCC)   [facility] Donita Brooks, MD   2 years ago COVID-19   [facility] Pickard, Priscille Heidelberg, MD       Future Appointments             In 1 week Brion Aliment Dois Davenport, NP [facility] [place]   In 1 month Pickard, Priscille Heidelberg, MD [facility]            Passed - Cr in normal range and within 360 days    Creat  Date Value Ref Range Status  10/31/2022 0.73 0.50 - 0.99 mg/dL Final   Creatinine, Urine  Date Value Ref Range Status  07/21/2022 245 20 - 275 mg/dL Final

## 2023-06-06 ENCOUNTER — Other Ambulatory Visit: Payer: Self-pay | Admitting: Cardiology

## 2023-06-06 ENCOUNTER — Ambulatory Visit: Payer: BC Managed Care – PPO | Attending: Cardiology

## 2023-06-06 DIAGNOSIS — Z8249 Family history of ischemic heart disease and other diseases of the circulatory system: Secondary | ICD-10-CM | POA: Diagnosis not present

## 2023-06-06 DIAGNOSIS — I2510 Atherosclerotic heart disease of native coronary artery without angina pectoris: Secondary | ICD-10-CM

## 2023-06-06 DIAGNOSIS — I10 Essential (primary) hypertension: Secondary | ICD-10-CM

## 2023-06-06 DIAGNOSIS — E7800 Pure hypercholesterolemia, unspecified: Secondary | ICD-10-CM

## 2023-06-06 LAB — ECHOCARDIOGRAM COMPLETE
Area-P 1/2: 4.49 cm2
S' Lateral: 2.8 cm

## 2023-06-09 ENCOUNTER — Other Ambulatory Visit: Payer: Self-pay | Admitting: Family Medicine

## 2023-06-09 DIAGNOSIS — E7800 Pure hypercholesterolemia, unspecified: Secondary | ICD-10-CM

## 2023-06-09 NOTE — Telephone Encounter (Signed)
Requested Prescriptions  Pending Prescriptions Disp Refills   metFORMIN (GLUCOPHAGE-XR) 500 MG 24 hr tablet [Pharmacy Med Name: METFORMIN HCL ER 500 MG TABLET] 180 tablet 0    Sig: TAKE 2 TABLETS BY MOUTH EVERY DAY WITH BREAKFAST     Endocrinology:  Diabetes - Biguanides Failed - 06/09/2023  1:42 AM      Failed - HBA1C is between 0 and 7.9 and within 180 days    Hgb A1C (fingerstick)  Date Value Ref Range Status  03/01/2013 5.0 <5.7 % Final    Comment:                                                                           According to the ADA Clinical Practice Recommendations for 2011, when HbA1c is used as a screening test:     >=6.5%   Diagnostic of Diabetes Mellitus            (if abnormal result is confirmed)   5.7-6.4%   Increased risk of developing Diabetes Mellitus   References:Diagnosis and Classification of Diabetes Mellitus,Diabetes Care,2011,34(Suppl 1):S62-S69 and Standards of Medical Care in         Diabetes - 2011,Diabetes Care,2011,34 (Suppl 1):S11-S61.     Hgb A1c MFr Bld  Date Value Ref Range Status  10/31/2022 6.4 (H) <5.7 % of total Hgb Final    Comment:    For someone without known diabetes, a hemoglobin  A1c value between 5.7% and 6.4% is consistent with prediabetes and should be confirmed with a  follow-up test. . For someone with known diabetes, a value <7% indicates that their diabetes is well controlled. A1c targets should be individualized based on duration of diabetes, age, comorbid conditions, and other considerations. . This assay result is consistent with an increased risk of diabetes. . Currently, no consensus exists regarding use of hemoglobin A1c for diagnosis of diabetes for children. . . This test was performed on the Roche cobas c503 platform. Effective 05/09/22, a change in test platforms from the Abbott Architect to the Roche cobas c503 may have shifted HbA1c results compared to historical results. Based on laboratory  validation testing conducted at [facility], the Roche platform relative to the Abbott platform had an average increase in HbA1c value of < or = 0.3%. This difference is within accepted  variability established by the [facility]. Note that not all individuals will have had a shift in their results and direct comparisons between historical and current results for testing conducted on different platforms is not recommended.          Failed - Valid encounter within last 6 months    Recent Outpatient Visits           1 year ago Acute cystitis without hematuria   [facility] Tanya Nones Priscille Heidelberg, MD   1 year ago Fatigue, unspecified type   [facility] Donita Brooks, MD   2 years ago Acute midline low back pain with left-sided sciatica   [facility] Medicine Cathlean Marseilles A, NP   2 years ago Controlled type 2 diabetes mellitus without complication, without long-term current use of insulin (HCC)   [facility] Medicine Lynnea Ferrier  T, MD   2 years ago COVID-19   [facility] Medicine Pickard, Priscille Heidelberg, MD       Future Appointments             In 4 days Brion Aliment, Dois Davenport, NP [facility] [place]   In 1 month Pickard, Priscille Heidelberg, MD [facility]            Passed - Cr in normal range and within 360 days    Creat  Date Value Ref Range Status  10/31/2022 0.73 0.50 - 0.99 mg/dL Final   Creatinine, Urine  Date Value Ref Range Status  07/21/2022 245 20 - 275 mg/dL Final         Passed - eGFR in normal range and within 360 days    GFR, Est African American  Date Value Ref Range Status  09/22/2020 129 > OR = 60 mL/min/1.36m2 Final   GFR, Est Non African American  Date Value Ref Range Status  09/22/2020 111 > OR = 60 mL/min/1.4m2 Final   GFR, Estimated  Date Value Ref Range Status  06/05/2021 >60 >60 mL/min Final     Comment:    (NOTE) Calculated using the CKD-EPI Creatinine Equation (2021)    eGFR  Date Value Ref Range Status  07/11/2022 111 > OR = 60 mL/min/1.60m2 Final         Passed - B12 Level in normal range and within 720 days    Vitamin B-12  Date Value Ref Range Status  07/11/2022 579 200 - 1,100 pg/mL Final         Passed - CBC within normal limits and completed in the last 12 months    WBC  Date Value Ref Range Status  10/31/2022 9.6 3.8 - 10.8 Thousand/uL Final   RBC  Date Value Ref Range Status  10/31/2022 5.23 (H) 3.80 - 5.10 Million/uL Final   Hemoglobin  Date Value Ref Range Status  10/31/2022 15.7 (H) 11.7 - 15.5 g/dL Final  78/29/5621 30.8 11.1 - 15.9 g/dL Final   HCT  Date Value Ref Range Status  10/31/2022 45.6 (H) 35.0 - 45.0 % Final   Hematocrit  Date Value Ref Range Status  09/05/2020 44.1 34.0 - 46.6 % Final   MCHC  Date Value Ref Range Status  10/31/2022 34.4 32.0 - 36.0 g/dL Final   [facility]  Date Value Ref Range Status  10/31/2022 30.0 27.0 - 33.0 pg Final   MCV  Date Value Ref Range Status  10/31/2022 87.2 80.0 - 100.0 fL Final  09/05/2020 85 79 - 97 fL Final   Platelet Count, POC  Date Value Ref Range Status  01/22/2014 365 142 - 424 K/uL Final   RDW  Date Value Ref Range Status  10/31/2022 12.3 11.0 - 15.0 % Final  09/05/2020 12.8 11.7 - 15.4 % Final   RDW, POC  Date Value Ref Range Status  01/22/2014 13.5 % Final          rosuvastatin (CRESTOR) 10 MG tablet [Pharmacy Med Name: ROSUVASTATIN CALCIUM 10 MG TAB] 90 tablet 1    Sig: TAKE 1 TABLET BY MOUTH EVERY DAY     Cardiovascular:  Antilipid - Statins 2 Failed - 06/09/2023  1:42 AM      Failed - Valid encounter within last 12 months    Recent Outpatient Visits           1 year ago Acute cystitis without hematuria  [facility] Pickard, Priscille Heidelberg, MD   1 year ago Fatigue, unspecified type   [facility] Medicine Donita Brooks, MD   2 years ago Acute  midline low back pain with left-sided sciatica   [facility] Medicine Cathlean Marseilles A, NP   2 years ago Controlled type 2 diabetes mellitus without complication, without long-term current use of insulin (HCC)   [facility] Pickard, Priscille Heidelberg, MD   2 years ago COVID-19   [facility] Pickard, Priscille Heidelberg, MD       Future Appointments             In 4 days Brion Aliment Dois Davenport, NP [place] [facility] [place]   In 1 month Pickard, Priscille Heidelberg, MD [facility]            Failed - Lipid Panel in normal range within the last 12 months    Cholesterol  Date Value Ref Range Status  10/31/2022 107 <200 mg/dL Final   LDL Cholesterol (Calc)  Date Value Ref Range Status  10/31/2022 50 mg/dL (calc) Final    Comment:    Reference range: <100 . Desirable range <100 mg/dL for primary prevention;   <70 mg/dL for patients with CHD or diabetic patients  with > or = 2 CHD risk factors. Marland Kitchen LDL-C is now calculated using the Martin-Hopkins  calculation, which is a validated novel method providing  better accuracy than the Friedewald equation in the  estimation of LDL-C.  Horald Pollen et al. Lenox Ahr. 1610;960(45): 2061-2068  (http://education.[facility].com/faq/FAQ164)    HDL  Date Value Ref Range Status  10/31/2022 38 (L) > OR = 50 mg/dL Final   Triglycerides  Date Value Ref Range Status  10/31/2022 107 <150 mg/dL Final         Passed - Cr in normal range and within 360 days    Creat  Date Value Ref Range Status  10/31/2022 0.73 0.50 - 0.99 mg/dL Final   Creatinine, Urine  Date Value Ref Range Status  07/21/2022 245 20 - 275 mg/dL Final         Passed - Patient is not pregnant

## 2023-06-13 ENCOUNTER — Encounter: Payer: Self-pay | Admitting: Nurse Practitioner

## 2023-06-13 ENCOUNTER — Ambulatory Visit: Payer: BC Managed Care – PPO | Attending: Nurse Practitioner | Admitting: Nurse Practitioner

## 2023-06-13 VITALS — BP 102/66 | HR 96 | Ht 64.5 in | Wt 157.0 lb

## 2023-06-13 DIAGNOSIS — E7800 Pure hypercholesterolemia, unspecified: Secondary | ICD-10-CM

## 2023-06-13 DIAGNOSIS — I10 Essential (primary) hypertension: Secondary | ICD-10-CM | POA: Diagnosis not present

## 2023-06-13 DIAGNOSIS — Z8249 Family history of ischemic heart disease and other diseases of the circulatory system: Secondary | ICD-10-CM

## 2023-06-13 DIAGNOSIS — I2510 Atherosclerotic heart disease of native coronary artery without angina pectoris: Secondary | ICD-10-CM | POA: Diagnosis not present

## 2023-06-13 DIAGNOSIS — E118 Type 2 diabetes mellitus with unspecified complications: Secondary | ICD-10-CM

## 2023-06-13 NOTE — Patient Instructions (Signed)
Medication Instructions:  No changes at this time.   *If you need a refill on your cardiac medications before your next appointment, please call your pharmacy*   Lab Work: None  If you have labs (blood work) drawn today and your tests are completely normal, you will receive your results only by: MyChart Message (if you have MyChart) OR A paper copy in the mail If you have any lab test that is abnormal or we need to change your treatment, we will call you to review the results.   Testing/Procedures: None   Follow-Up: At [facility], you and your health needs are our priority.  As part of our continuing mission to provide you with exceptional heart care, we have created designated Provider Care Teams.  These Care Teams include your primary Cardiologist (physician) and Advanced Practice Providers (APPs -  Physician Assistants and Nurse Practitioners) who all work together to provide you with the care you need, when you need it.  Your next appointment:   1 year(s)  Provider:   You may see Debbe Odea, MD or one of the following Advanced Practice Providers on your designated Care Team:   Nicolasa Ducking, NP Eula Listen, PA-C Cadence Fransico Michael, PA-C Charlsie [facility], NP Carlos Levering, NP

## 2023-06-13 NOTE — Progress Notes (Unsigned)
Office Visit    Patient Name: Candice Jones Date of Encounter: 06/13/2023  Primary Care Provider:  Donita Brooks, MD Primary Cardiologist:  Candice Odea, MD  Chief Complaint    47 y.o. female with a history of hypertension, hyperlipidemia, diabetes, mild coronary calcium, asthma, and family history of hypertrophic cardiomyopathy, who presents for follow-up related to chest pain.  Past Medical History  Subjective   Past Medical History:  Diagnosis Date   Adenomyosis    Asthma    Coronary artery calcification seen on CT scan    a. 03/2023 Cardiac CT: Ca2+ =5.95 in LAD (91st%'ile).   COVID-19    Diabetes (HCC)    Diastolic dysfunction    a. 06/2023 Echo: EF 60-65%, no rwma, GrI DD, nl RV size/fxn, mild MR.   Gall stones    Gestational diabetes mellitus    Hiatal hernia    Hiatal hernia    a. 03/2023 CT Chest: tiny HH.   Hyperlipidemia    IBS (irritable bowel syndrome)    Internal hemorrhoids    Kidney stones    Meniere's syndrome    Vertigo    Past Surgical History:  Procedure Laterality Date   CESAREAN SECTION  08/1999, 05/2003   CHOLECYSTECTOMY  2011   CYSTECTOMY  1997   left wrist    ENDOMETRIAL ABLATION  01/2010   HEMORRHOID BANDING  2015   MINOR HEMORRHOIDECTOMY  04/2014   thrombosed   WISDOM TOOTH EXTRACTION  1996/1997    Allergies  Allergies  Allergen Reactions   Augmentin [Amoxicillin-Pot Clavulanate] Nausea And Vomiting    Severe nausea/vomiting    Codeine Other (See Comments)    Makes pain worse   Ciprofloxacin Hcl Rash   Erythromycin Rash   Sulfa Antibiotics Rash      History of Present Illness      47 y.o. y/o female with a history of hypertension, hyperlipidemia, diabetes, mild coronary calcium, asthma, and family history of hypertrophic cardiomyopathy.  In August 2024, she underwent cardiac CT for calcium scoring, which showed a score of 5.95, placing her in the 91st percentile for age/sex, and she was placed on low-dose aspirin and  statin therapy.  She established care with Dr. Azucena Jones in November 2024 and reported occasional chest pain in the setting of asthma attack or prior COVID infection.  She also reported that her mother has a history of hypertrophic cardiomyopathy.  An echocardiogram was performed, which showed an EF of 60 to 65% without regional wall motion abnormalities, grade 1 diastolic dysfunction, normal RV size and function, and mild MR.    Since her last visit, Candice Jones has felt well.  She has been walking quite a bit with her husband and has tolerated this well.  Echo results discussed in detail.  She denies chest pain, dyspnea, or palpitations. Objective  Home Medications    Current Outpatient Medications  Medication Sig Dispense Refill   albuterol (VENTOLIN HFA) 108 (90 Base) MCG/ACT inhaler Inhale 2 puffs into the lungs every 4 (four) hours as needed for wheezing or shortness of breath. 18 g 0   albuterol (VENTOLIN HFA) 108 (90 Base) MCG/ACT inhaler INHALE 1-2 PUFFS BY MOUTH EVERY 6 HOURS AS NEEDED FOR WHEEZE OR SHORTNESS OF BREATH 8.5 each 1   Ascorbic Acid (VITAMIN C PO) Take 1 tablet by mouth every morning.     Azelastine HCl 137 MCG/SPRAY SOLN Place 2 sprays into both nostrils every morning.     Blood Glucose Monitoring Suppl  KIT Use to check fasting glucose daily ICD10: R73.01.Dispense based on insurance preference 1 kit 0   Cholecalciferol (VITAMIN D3 PO) Take 1 tablet by mouth every morning.     Cranberry-Vitamin C-Vitamin E (CRANBERRY PLUS VITAMIN C) 4200-20-3 MG-MG-UNIT CAPS Take 2 capsules by mouth every morning.     docusate sodium (COLACE) 100 MG capsule Take 100 mg by mouth every morning.     fluticasone (FLONASE) 50 MCG/ACT nasal spray SPRAY 2 SPRAYS INTO EACH NOSTRIL EVERY DAY (Patient taking differently: Place 2 sprays into both nostrils daily as needed for allergies or rhinitis.) 48 mL 1   glucose blood test strip Use with glucose meter to check blood sugar.ICD10: R73.01.Dispense  based on insurance preference 100 each 12   Lancets (FREESTYLE) lancets Use to check blood sugar ICD10: R73.01.Dispense based on insurance preference 100 each 12   levocetirizine (XYZAL) 5 MG tablet Take 5 mg by mouth every morning.     losartan (COZAAR) 25 MG tablet TAKE 1 TABLET (25 MG TOTAL) BY MOUTH DAILY. 90 tablet 3   metFORMIN (GLUCOPHAGE-XR) 500 MG 24 hr tablet TAKE 2 TABLETS BY MOUTH EVERY DAY WITH BREAKFAST 180 tablet 0   metoprolol succinate (TOPROL-XL) 50 MG 24 hr tablet TAKE 1 TABLET BY MOUTH EVERY DAY WITH OR IMMEDIATELY FOLLOWING A MEAL 90 tablet 0   Misc Natural Products (ELDERBERRY ZINC/VIT C/IMMUNE MT) Use as directed in the mouth or throat.     Multiple Vitamin (MULTIVITAMIN WITH MINERALS) TABS tablet Take 1 tablet by mouth every morning.     Multiple Vitamins-Minerals (IMMUNE SUPPORT) CHEW Chew 2 tablets by mouth every morning.     norethindrone-ethinyl estradiol (LOESTRIN) 1-20 MG-MCG tablet Take 1 tablet by mouth every morning.     Olopatadine HCl (PATADAY) 0.7 % SOLN Apply 1 drop twice daily to the left eye as needed for redness and itching. 5 mL 0   pantoprazole (PROTONIX) 40 MG tablet Take 1 tablet (40 mg total) by mouth 2 (two) times daily. 180 tablet 2   rosuvastatin (CRESTOR) 10 MG tablet TAKE 1 TABLET BY MOUTH EVERY DAY 90 tablet 1   Semaglutide, 2 MG/DOSE, (OZEMPIC, 2 MG/DOSE,) 8 MG/3ML SOPN INJECT 2 MG AS DIRECTED ONCE A WEEK. 3 mL 1   VITAMIN A PO Take 1 capsule by mouth every morning.     PEPPERMINT OIL PO Take 1 capsule by mouth every morning. (Patient not taking: Reported on 06/13/2023)     No current facility-administered medications for this visit.     Physical Exam    VS:  BP 102/66 (BP Location: Left Arm, Patient Position: Sitting, Cuff Size: Normal)   Pulse 96   Ht 5' 4.5" (1.638 m)   Wt 157 lb (71.2 kg)   SpO2 100%   BMI 26.53 kg/m  , BMI Body mass index is 26.53 kg/m.       GEN: Well nourished, well developed, in no acute distress. HEENT:  normal. Neck: Supple, no JVD, carotid bruits, or masses. Cardiac: RRR, no murmurs, rubs, or gallops. No clubbing, cyanosis, edema.  Radials 2+/PT 2+ and equal bilaterally.  Respiratory:  Respirations regular and unlabored, clear to auscultation bilaterally. GI: Soft, nontender, nondistended, BS + x 4. MS: no deformity or atrophy. Skin: warm and dry, no rash. Neuro:  Strength and sensation are intact. Psych: Normal affect.  Accessory Clinical Findings    Lab Results  Component Value Date   WBC 9.6 10/31/2022   HGB 15.7 (H) 10/31/2022   HCT 45.6 (H)  10/31/2022   MCV 87.2 10/31/2022   PLT 395 10/31/2022   Lab Results  Component Value Date   CREATININE 0.73 10/31/2022   BUN 11 10/31/2022   NA 141 10/31/2022   K 3.6 10/31/2022   CL 101 10/31/2022   CO2 27 10/31/2022   Lab Results  Component Value Date   ALT 17 10/31/2022   AST 15 10/31/2022   ALKPHOS 77 09/05/2020   BILITOT 0.4 10/31/2022   Lab Results  Component Value Date   CHOL 107 10/31/2022   HDL 38 (L) 10/31/2022   LDLCALC 50 10/31/2022   TRIG 107 10/31/2022   CHOLHDL 2.8 10/31/2022    Lab Results  Component Value Date   HGBA1C 6.4 (H) 10/31/2022   Lab Results  Component Value Date   TSH 1.44 11/03/2022       Assessment & Plan    1.  Coronary calcifications/family history of premature coronary artery disease: Patient's grandfather had CABG in his 30s.  Calcium scoring earlier this year showed a coronary calcium score of 5.95, involving the LAD and placing her in the 91st percentile for age and sex.  She does not experience chest pain or dyspnea and has been active, walking regularly.  She has done quite a bit of lifestyle modification and is eating close to a Mediterranean diet with only limited red meat a few times a month.  She has lost a significant amount of weight over the past year.  She otherwise remains on statin therapy.  She is not currently taking an aspirin but in the setting of multiple risk  factors, will begin to.  2.  Primary hypertension: Blood pressure well-controlled today at 102/66 on metoprolol succinate and losartan.  3.  Hyperlipidemia: LDL of 50 in April with normal LFTs.  Continue statin therapy.  4.  Type 2 diabetes mellitus: A1c 6.4 in April, down from 8.4 last December.  She is on metformin and semaglutide.  5.  Family history of hypertrophic cardiomyopathy: Echo reviewed with her today.  No evidence of hypertrophic cardiomyopathy.  Discussed etiology of diastolic dysfunction.  Heart rate and blood pressure stable today.  Reassurance provided.  6.  Disposition: Follow-up in 1 year or sooner if necessary.  Nicolasa Ducking, NP 06/13/2023, 3:48 PM

## 2023-06-28 ENCOUNTER — Encounter (INDEPENDENT_AMBULATORY_CARE_PROVIDER_SITE_OTHER): Payer: Self-pay

## 2023-06-28 ENCOUNTER — Ambulatory Visit (INDEPENDENT_AMBULATORY_CARE_PROVIDER_SITE_OTHER): Payer: BC Managed Care – PPO

## 2023-06-28 VITALS — Ht 61.0 in | Wt 155.0 lb

## 2023-06-28 DIAGNOSIS — K219 Gastro-esophageal reflux disease without esophagitis: Secondary | ICD-10-CM

## 2023-06-28 DIAGNOSIS — R058 Other specified cough: Secondary | ICD-10-CM

## 2023-06-28 DIAGNOSIS — H8101 Meniere's disease, right ear: Secondary | ICD-10-CM

## 2023-06-29 DIAGNOSIS — H8101 Meniere's disease, right ear: Secondary | ICD-10-CM | POA: Insufficient documentation

## 2023-06-29 DIAGNOSIS — K219 Gastro-esophageal reflux disease without esophagitis: Secondary | ICD-10-CM | POA: Insufficient documentation

## 2023-06-29 NOTE — Progress Notes (Signed)
Patient ID: Candice Jones, female   DOB: April 29, 1976, 47 y.o.   MRN: [id number]  Follow-up: Right ear Mnire's disease, tinnitus, hoarseness, reflux New complaints: Nasal congestion, productive cough  HPI: The patient is a 47 year old female who returns today for her follow-up evaluation.  The patient has a history of right-sided Mnire's disease, right ear tinnitus, recurrent hoarseness, and laryngopharyngeal reflux.  She was treated with low-salt diet, Dyazide diuretic, Protonix, and diet modifications.  The patient returns today reporting that her reflux symptoms are currently under control.  She has not noted any spinning vertigo over the past 6 months.  She continues to have intermittent right ear tinnitus.  She has new complaints today of increasing nasal congestion and productive cough over the past few weeks.  She has self treated with Mucinex D, Flonase, and azelastine nasal sprays.  However, she continues to be symptomatic.  Exam: General: Communicates without difficulty, well nourished, no acute distress. Head: Normocephalic, no evidence injury, no tenderness, facial buttresses intact without stepoff. Eyes: PERRL, EOMI. No scleral icterus, conjunctivae clear. Neuro: CN II exam reveals vision grossly intact. No nystagmus at any point of gaze. Ears: Auricles well formed without lesions. Ear canals are intact without mass or lesion. No erythema or edema is appreciated. The TMs are intact without fluid. Nose: External evaluation reveals normal support and skin without lesions. Dorsum is intact. Anterior rhinoscopy reveals erythematous and congested mucosa over anterior aspect of inferior turbinates and intact septum. No purulence noted. Oral:  Oral cavity and oropharynx are intact, symmetric, without erythema or edema. Mucosa is moist without lesions. Neck: Full range of motion without pain. There is no significant lymphadenopathy. No masses palpable. Thyroid bed within normal limits to palpation.  Parotid glands and submandibular glands equal bilaterally without mass. Trachea is midline. Neuro:  CN 2-12 grossly intact. Gait normal. Vestibular: No nystagmus at any point of gaze. The cerebellar examination is unremarkable.    Assessment 1.  The patient's Mnire's disease is currently under control with the use of low-salt diet and Dyazide diuretic. 2.  Her laryngopharyngeal reflux symptoms are also under control with the use of Protonix and diet modifications. 3.  Likely acute URI, with productive cough.  Plan 1.  The physical exam findings are reviewed with the patient. 2.  Continue with her low-salt diet, and dyazide diuretic. 3.  Continue with Protonix and diet modifications. 4.  Amoxicillin 875 mg p.o. twice daily for 10 days. 5.  The patient will return for reevaluation in 6 months, sooner if needed.

## 2023-07-31 ENCOUNTER — Ambulatory Visit (INDEPENDENT_AMBULATORY_CARE_PROVIDER_SITE_OTHER): Payer: BC Managed Care – PPO | Admitting: Family Medicine

## 2023-07-31 ENCOUNTER — Encounter: Payer: Self-pay | Admitting: Family Medicine

## 2023-07-31 ENCOUNTER — Other Ambulatory Visit: Payer: Self-pay | Admitting: Family Medicine

## 2023-07-31 VITALS — BP 116/70 | HR 87 | Temp 98.4°F | Ht 61.0 in | Wt 150.5 lb

## 2023-07-31 DIAGNOSIS — Z7984 Long term (current) use of oral hypoglycemic drugs: Secondary | ICD-10-CM

## 2023-07-31 DIAGNOSIS — Z1211 Encounter for screening for malignant neoplasm of colon: Secondary | ICD-10-CM

## 2023-07-31 DIAGNOSIS — E1165 Type 2 diabetes mellitus with hyperglycemia: Secondary | ICD-10-CM

## 2023-07-31 DIAGNOSIS — E119 Type 2 diabetes mellitus without complications: Secondary | ICD-10-CM

## 2023-07-31 DIAGNOSIS — Z0001 Encounter for general adult medical examination with abnormal findings: Secondary | ICD-10-CM

## 2023-07-31 DIAGNOSIS — Z0000 Encounter for general adult medical examination without abnormal findings: Secondary | ICD-10-CM

## 2023-07-31 MED ORDER — METOPROLOL SUCCINATE ER 25 MG PO TB24: 25.0000 mg | ORAL_TABLET | Freq: Every day | ORAL | 3 refills | Status: DC

## 2023-07-31 NOTE — Progress Notes (Signed)
Wt Readings from Last 3 Encounters:  07/31/23 150 lb 8 oz (68.3 kg)  06/28/23 155 lb (70.3 kg)  06/13/23 157 lb (71.2 kg)     Subjective:    Patient ID: Candice Jones, female    DOB: November 08, 1975, 47 y.o.   MRN: [id number]  HPI  Patient is a very pleasant 47 year old Caucasian female here today for physical exam.  She sees a gynecologist who performs her Pap smears and her mammograms.  These are up-to-date.  All of her immunizations are up-to-date  the COVID-vaccine but she defers at this time.  She is lost almost 60 pounds.  As a result, the patient feels much better however she is reporting feeling somewhat tired and fatigued on her blood pressure medication.  Her blood pressure today is relatively low for this patient.  She is due for a colonoscopy.  Her last colonoscopy was in 2018.  She has a sister with colon cancer at an early age.  Therefore her next colonoscopy should have been in 2023.  Past Medical History:  Diagnosis Date   Adenomyosis    Asthma    Coronary artery calcification seen on CT scan    a. 03/2023 Cardiac CT: Ca2+ =5.95 in LAD (91st%'ile).   COVID-19    Diabetes (HCC)    Diastolic dysfunction    a. 06/2023 Echo: EF 60-65%, no rwma, GrI DD, nl RV size/fxn, mild MR.   Gall stones    Gestational diabetes mellitus    Hiatal hernia    Hiatal hernia    a. 03/2023 CT Chest: tiny HH.   Hyperlipidemia    IBS (irritable bowel syndrome)    Internal hemorrhoids    Kidney stones    Meniere's syndrome    Vertigo    Past Surgical History:  Procedure Laterality Date   CESAREAN SECTION  08/1999, 05/2003   CHOLECYSTECTOMY  2011   CYSTECTOMY  1997   left wrist    ENDOMETRIAL ABLATION  01/2010   HEMORRHOID BANDING  2015   MINOR HEMORRHOIDECTOMY  04/2014   thrombosed   WISDOM TOOTH EXTRACTION  1996/1997   Current Outpatient Medications on File Prior to Visit  Medication Sig Dispense Refill   albuterol (VENTOLIN HFA) 108 (90 Base) MCG/ACT inhaler Inhale 2 puffs into the lungs  every 4 (four) hours as needed for wheezing or shortness of breath. 18 g 0   albuterol (VENTOLIN HFA) 108 (90 Base) MCG/ACT inhaler INHALE 1-2 PUFFS BY MOUTH EVERY 6 HOURS AS NEEDED FOR WHEEZE OR SHORTNESS OF BREATH 8.5 each 1   Ascorbic Acid (VITAMIN C PO) Take 1 tablet by mouth every morning.     aspirin EC 81 MG tablet Take 81 mg by mouth daily. Swallow whole.     Azelastine HCl 137 MCG/SPRAY SOLN Place 2 sprays into both nostrils every morning.     Blood Glucose Monitoring Suppl KIT Use to check fasting glucose daily ICD10: R73.01.Dispense based on insurance preference 1 kit 0   Cholecalciferol (VITAMIN D3 PO) Take 1 tablet by mouth every morning.     Cranberry-Vitamin C-Vitamin E (CRANBERRY PLUS VITAMIN C) 4200-20-3 MG-MG-UNIT CAPS Take 2 capsules by mouth every morning.     docusate sodium (COLACE) 100 MG capsule Take 100 mg by mouth every morning.     fluticasone (FLONASE) 50 MCG/ACT nasal spray SPRAY 2 SPRAYS INTO EACH NOSTRIL EVERY DAY (Patient taking differently: Place 2 sprays into both nostrils daily as needed for allergies or rhinitis.) 48 mL 1   glucose  blood test strip Use with glucose meter to check blood sugar.ICD10: R73.01.Dispense based on insurance preference 100 each 12   Lancets (FREESTYLE) lancets Use to check blood sugar ICD10: R73.01.Dispense based on insurance preference 100 each 12   levocetirizine (XYZAL) 5 MG tablet Take 5 mg by mouth every morning.     losartan (COZAAR) 25 MG tablet TAKE 1 TABLET (25 MG TOTAL) BY MOUTH DAILY. 90 tablet 3   metFORMIN (GLUCOPHAGE-XR) 500 MG 24 hr tablet TAKE 2 TABLETS BY MOUTH EVERY DAY WITH BREAKFAST 180 tablet 0   Misc Natural Products (ELDERBERRY ZINC/VIT C/IMMUNE MT) Use as directed in the mouth or throat.     Multiple Vitamin (MULTIVITAMIN WITH MINERALS) TABS tablet Take 1 tablet by mouth every morning.     Multiple Vitamins-Minerals (IMMUNE SUPPORT) CHEW Chew 2 tablets by mouth every morning.     norethindrone-ethinyl estradiol  (LOESTRIN) 1-20 MG-MCG tablet Take 1 tablet by mouth every morning.     Olopatadine HCl (PATADAY) 0.7 % SOLN Apply 1 drop twice daily to the left eye as needed for redness and itching. 5 mL 0   pantoprazole (PROTONIX) 40 MG tablet Take 1 tablet (40 mg total) by mouth 2 (two) times daily. 180 tablet 2   rosuvastatin (CRESTOR) 10 MG tablet TAKE 1 TABLET BY MOUTH EVERY DAY 90 tablet 1   Semaglutide, 2 MG/DOSE, (OZEMPIC, 2 MG/DOSE,) 8 MG/3ML SOPN INJECT 2 MG AS DIRECTED ONCE A WEEK. 3 mL 1   PEPPERMINT OIL PO Take 1 capsule by mouth every morning. (Patient not taking: Reported on 06/13/2023)     VITAMIN A PO Take 1 capsule by mouth every morning. (Patient not taking: Reported on 07/31/2023)     No current facility-administered medications on file prior to visit.   Allergies  Allergen Reactions   Augmentin [Amoxicillin-Pot Clavulanate] Nausea And Vomiting    Severe nausea/vomiting    Codeine Other (See Comments)    Makes pain worse   Ciprofloxacin Hcl Rash   Erythromycin Rash   Sulfa Antibiotics Rash   Social History   Socioeconomic History   Marital status: Married    Spouse name: Not on file   Number of children: 3   Years of education: Not on file   Highest education level: Bachelor's degree (e.g., BA, AB, BS)  Occupational History   Occupation: Energy manager: [employer]  Tobacco Use   Smoking status: Never   Smokeless tobacco: Never  Vaping Use   Vaping status: Never Used  Substance and Sexual Activity   Alcohol use: Not Currently    Comment: rarely   Drug use: No   Sexual activity: Yes  Other Topics Concern   Not on file  Social History Narrative   Married, 2 sons and 1 daughter   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   No caffeine alcohol tobacco or drugs   Social Drivers of Corporate investment banker Strain: Low Risk  (10/31/2022)   Overall Financial Resource Strain (CARDIA)    Difficulty of Paying Living Expenses:  Not hard at all  Food Insecurity: No Food Insecurity (10/31/2022)   Hunger Vital Sign    Worried About Running Out of Food in the Last Year: Never true    Ran Out of Food in the Last Year: Never true  Transportation Needs: No Transportation Needs (10/31/2022)   PRAPARE - Administrator, Civil Service (Medical): No    Lack of Transportation (Non-Medical):  No  Physical Activity: Unknown (10/31/2022)   Exercise Vital Sign    Days of Exercise per Week: 0 days    Minutes of Exercise per Session: Not on file  Stress: No Stress Concern Present (10/31/2022)   [employer] of Occupational Health - Occupational Stress Questionnaire    Feeling of Stress : Only a little  Social Connections: Socially Integrated (10/31/2022)   Social Connection and Isolation Panel [NHANES]    Frequency of Communication with Friends and Family: More than three times a week    Frequency of Social Gatherings with Friends and Family: Once a week    Attends Religious Services: More than 4 times per year    Active Member of [employer] or Organizations: Yes    Attends Engineer, structural: More than 4 times per year    Marital Status: Married  Catering manager Violence: Not on file      Review of Systems  Constitutional: Negative.   All other systems reviewed and are negative.      Objective:   Physical Exam Vitals reviewed.  Constitutional:      General: She is not in acute distress.    Appearance: Normal appearance. She is well-developed. She is obese. She is not diaphoretic.  HENT:     Head: Normocephalic and atraumatic.     Right Ear: Tympanic membrane and ear canal normal.     Left Ear: Tympanic membrane and ear canal normal.     Nose: Nose normal. No congestion or rhinorrhea.     Mouth/Throat:     Mouth: Mucous membranes are moist.     Pharynx: No oropharyngeal exudate or posterior oropharyngeal erythema.  Eyes:     Extraocular Movements: Extraocular movements intact.     Pupils: Pupils  are equal, round, and reactive to light.  Neck:     Vascular: No carotid bruit.  Cardiovascular:     Rate and Rhythm: Normal rate and regular rhythm.     Pulses: Normal pulses.     Heart sounds: Normal heart sounds. No murmur heard.    No friction rub.  Pulmonary:     Effort: Pulmonary effort is normal. No respiratory distress.     Breath sounds: Normal breath sounds. No stridor. No wheezing.  Abdominal:     General: Abdomen is flat. Bowel sounds are normal. There is no distension.     Palpations: Abdomen is soft.     Tenderness: There is no abdominal tenderness. There is no guarding or rebound.  Musculoskeletal:     Cervical back: Normal range of motion and neck supple. No rigidity or tenderness.     Right lower leg: No edema.     Left lower leg: No edema.  Lymphadenopathy:     Cervical: No cervical adenopathy.  Skin:    Findings: No erythema or rash.  Neurological:     General: No focal deficit present.     Mental Status: She is alert and oriented to person, place, and time.  Psychiatric:        Mood and Affect: Mood normal.        Behavior: Behavior normal.        Thought Content: Thought content normal.        Judgment: Judgment normal.           Assessment & Plan:  Colon cancer screening - Plan: Ambulatory referral to Gastroenterology  Controlled type 2 diabetes mellitus without complication, without long-term current use of insulin (HCC) - Plan: CBC  with Differential/Platelet, COMPLETE METABOLIC PANEL WITH GFR, Lipid panel, Hemoglobin A1c, Microalbumin/Creatinine Ratio, Urine  General medical exam Schedule the patient for a colonoscopy.  Mammogram and Pap smear up-to-date.  Immunizations are up-to-date except for COVID-vaccine which she declines.  Check CBC CMP lipid panel and A1c.  Goal LDL cholesterol is less than 55 given her elevated coronary artery calcium score.  Goal A1c is less than 6.5.  Check urine protein to creatinine ratio.  Wean off Toprol due to  hypotension and fatigue.  Decrease to 25 mg today.

## 2023-08-01 LAB — CBC WITH DIFFERENTIAL/PLATELET
Absolute Lymphocytes: 3011 {cells}/uL (ref 850–3900)
Absolute Monocytes: 531 {cells}/uL (ref 200–950)
Basophils Absolute: 39 {cells}/uL (ref 0–200)
Basophils Relative: 0.5 %
Eosinophils Absolute: 77 {cells}/uL (ref 15–500)
Eosinophils Relative: 1 %
HCT: 44.9 % (ref 35.0–45.0)
Hemoglobin: 15.1 g/dL (ref 11.7–15.5)
MCH: 29.8 pg (ref 27.0–33.0)
MCHC: 33.6 g/dL (ref 32.0–36.0)
MCV: 88.6 fL (ref 80.0–100.0)
MPV: 9.4 fL (ref 7.5–12.5)
Monocytes Relative: 6.9 %
Neutro Abs: 4043 {cells}/uL (ref 1500–7800)
Neutrophils Relative %: 52.5 %
Platelets: 361 10*3/uL (ref 140–400)
RBC: 5.07 10*6/uL (ref 3.80–5.10)
RDW: 12.2 % (ref 11.0–15.0)
Total Lymphocyte: 39.1 %
WBC: 7.7 10*3/uL (ref 3.8–10.8)

## 2023-08-01 LAB — HEMOGLOBIN A1C
Hgb A1c MFr Bld: 5.3 %{Hb} (ref <5.7)
Mean Plasma Glucose: 105 mg/dL
eAG (mmol/L): 5.8 mmol/L

## 2023-08-01 LAB — MICROALBUMIN / CREATININE URINE RATIO
Creatinine, Urine: 363 mg/dL — ABNORMAL HIGH (ref 20–275)
Microalb Creat Ratio: 6 mg/g{creat} (ref <30)
Microalb, Ur: 2.3 mg/dL

## 2023-08-01 LAB — LIPID PANEL
Cholesterol: 109 mg/dL (ref <200)
HDL: 39 mg/dL — ABNORMAL LOW (ref >=50)
LDL Cholesterol (Calc): 54 mg/dL
Non-HDL Cholesterol (Calc): 70 mg/dL (ref <130)
Total CHOL/HDL Ratio: 2.8 (calc) (ref <5.0)
Triglycerides: 79 mg/dL (ref <150)

## 2023-08-01 LAB — COMPLETE METABOLIC PANEL WITH GFR
AG Ratio: 1.8 (calc) (ref 1.0–2.5)
ALT: 14 U/L (ref 6–29)
AST: 15 U/L (ref 10–35)
Albumin: 4.2 g/dL (ref 3.6–5.1)
Alkaline phosphatase (APISO): 84 U/L (ref 31–125)
BUN: 11 mg/dL (ref 7–25)
CO2: 26 mmol/L (ref 20–32)
Calcium: 8.8 mg/dL (ref 8.6–10.2)
Chloride: 105 mmol/L (ref 98–110)
Creat: 0.63 mg/dL (ref 0.50–0.99)
Globulin: 2.4 g/dL (ref 1.9–3.7)
Glucose, Bld: 77 mg/dL (ref 65–99)
Potassium: 3.6 mmol/L (ref 3.5–5.3)
Sodium: 142 mmol/L (ref 135–146)
Total Bilirubin: 0.5 mg/dL (ref 0.2–1.2)
Total Protein: 6.6 g/dL (ref 6.1–8.1)
eGFR: 109 mL/min/{1.73_m2} (ref >=60)

## 2023-08-03 ENCOUNTER — Other Ambulatory Visit: Payer: Self-pay | Admitting: Family Medicine

## 2023-08-07 NOTE — Telephone Encounter (Signed)
 Requested medication (s) are due for refill today: yes  Requested medication (s) are on the active medication list: yes  Last refill:  09/27/22  Future visit scheduled: no  Notes to clinic:  historical provider, please review for refill      Requested Prescriptions  Pending Prescriptions Disp Refills   Azelastine  HCl 137 MCG/SPRAY SOLN [Pharmacy Med Name: AZELASTINE  0.1% (137 MCG) SPRY]  2    Sig: 2 SPRAYS INTRANASALLY ONCE PER DAY IN EACH NOSTRIL     Ear, Nose, and Throat: Nasal Preparations - Antiallergy Failed - 08/07/2023 12:41 PM      Failed - Valid encounter within last 12 months    Recent Outpatient Visits           1 year ago Acute cystitis without hematuria   [facility] Duanne Butler DASEN, MD   1 year ago Fatigue, unspecified type   [facility] Duanne Butler DASEN, MD   2 years ago Acute midline low back pain with left-sided sciatica   [facility] Chandra Raisin A, NP   2 years ago Controlled type 2 diabetes mellitus without complication, without long-term current use of insulin (HCC)   [facility] Pickard, Butler DASEN, MD   2 years ago COVID-19   [facility] Pickard, Butler DASEN, MD

## 2023-09-08 ENCOUNTER — Encounter: Payer: Self-pay | Admitting: Family Medicine

## 2023-09-08 ENCOUNTER — Ambulatory Visit (INDEPENDENT_AMBULATORY_CARE_PROVIDER_SITE_OTHER): Payer: 59 | Admitting: Family Medicine

## 2023-09-08 VITALS — BP 120/62 | HR 101 | Temp 98.6°F | Ht 61.0 in | Wt 150.0 lb

## 2023-09-08 DIAGNOSIS — F418 Other specified anxiety disorders: Secondary | ICD-10-CM

## 2023-09-08 MED ORDER — ALPRAZOLAM 0.5 MG PO TABS: 0.5000 mg | ORAL_TABLET | Freq: Three times a day (TID) | ORAL | 0 refills | Status: AC | PRN
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
## 2023-09-21 ENCOUNTER — Other Ambulatory Visit: Payer: Self-pay | Admitting: Family Medicine

## 2023-09-21 DIAGNOSIS — E1165 Type 2 diabetes mellitus with hyperglycemia: Secondary | ICD-10-CM

## 2023-09-30 ENCOUNTER — Other Ambulatory Visit: Payer: Self-pay | Admitting: Family Medicine

## 2023-10-02 ENCOUNTER — Encounter: Payer: Self-pay | Admitting: Family Medicine

## 2023-10-02 MED ORDER — METFORMIN HCL ER 500 MG PO TB24: ORAL_TABLET | ORAL | 0 refills | Status: DC

## 2023-10-02 NOTE — Telephone Encounter (Signed)
 Called and spoke w/pt, per pt since last OV, pt forgot the dose of the metformin? Please clarify the dose and directions.   Thank you.

## 2023-10-08 ENCOUNTER — Other Ambulatory Visit: Payer: Self-pay | Admitting: Family Medicine

## 2023-10-10 NOTE — Telephone Encounter (Signed)
 Too soon for refill, last refill 10/04/23 for 30 days.  Requested Prescriptions  Pending Prescriptions Disp Refills   metFORMIN (GLUCOPHAGE-XR) 500 MG 24 hr tablet [Pharmacy Med Name: METFORMIN HCL ER 500 MG TABLET] 60 tablet 0    Sig: TAKE 2 TABLETS BY MOUTH EVERY DAY WITH BREAKFAST     Endocrinology:  Diabetes - Biguanides Failed - 10/10/2023  8:45 AM      Failed - Valid encounter within last 6 months    Recent Outpatient Visits           1 year ago Acute cystitis without hematuria   [facility] Donita Brooks, MD   2 years ago Fatigue, unspecified type   [facility] Donita Brooks, MD   2 years ago Acute midline low back pain with left-sided sciatica   [facility] Valentino Nose, NP   3 years ago Controlled type 2 diabetes mellitus without complication, without long-term current use of insulin (HCC)   [facility] Pickard, Priscille Heidelberg, MD   3 years ago COVID-19   [facility] Pickard, Priscille Heidelberg, MD              Passed - Cr in normal range and within 360 days    Creat  Date Value Ref Range Status  07/31/2023 0.63 0.50 - 0.99 mg/dL Final   Creatinine, Urine  Date Value Ref Range Status  07/31/2023 363 (H) 20 - 275 mg/dL Final         Passed - HBA1C is between 0 and 7.9 and within 180 days    Hgb A1C (fingerstick)  Date Value Ref Range Status  03/01/2013 5.0 <5.7 % Final    Comment:                                                                           According to the ADA Clinical Practice Recommendations for 2011, when HbA1c is used as a screening test:     >=6.5%   Diagnostic of Diabetes Mellitus            (if abnormal result is confirmed)   5.7-6.4%   Increased risk of developing Diabetes Mellitus   References:Diagnosis and Classification of Diabetes Mellitus,Diabetes Care,2011,34(Suppl 1):S62-S69 and Standards of Medical Care in         Diabetes - 2011,Diabetes  Care,2011,34 (Suppl 1):S11-S61.     Hgb A1c MFr Bld  Date Value Ref Range Status  07/31/2023 5.3 <5.7 % of total Hgb Final    Comment:    For the purpose of screening for the presence of diabetes: . <5.7%       Consistent with the absence of diabetes 5.7-6.4%    Consistent with increased risk for diabetes             (prediabetes) > or =6.5%  Consistent with diabetes . This assay result is consistent with a decreased risk of diabetes. . Currently, no consensus exists regarding use of hemoglobin A1c for diagnosis of diabetes in children. . According to American Diabetes Association (ADA) guidelines, hemoglobin A1c <7.0% represents optimal control in non-pregnant diabetic patients. Different metrics may apply to specific patient populations.  Standards of Medical Care in Diabetes(ADA). .          Passed - eGFR in normal range and within 360 days    GFR, Est African American  Date Value Ref Range Status  09/22/2020 129 > OR = 60 mL/min/1.26m2 Final   GFR, Est Non African American  Date Value Ref Range Status  09/22/2020 111 > OR = 60 mL/min/1.72m2 Final   GFR, Estimated  Date Value Ref Range Status  06/05/2021 >60 >60 mL/min Final    Comment:    (NOTE) Calculated using the CKD-EPI Creatinine Equation (2021)    eGFR  Date Value Ref Range Status  07/31/2023 109 > OR = 60 mL/min/1.65m2 Final         Passed - B12 Level in normal range and within 720 days    Vitamin B-12  Date Value Ref Range Status  07/11/2022 579 200 - 1,100 pg/mL Final         Passed - CBC within normal limits and completed in the last 12 months    WBC  Date Value Ref Range Status  07/31/2023 7.7 3.8 - 10.8 Thousand/uL Final   RBC  Date Value Ref Range Status  07/31/2023 5.07 3.80 - 5.10 Million/uL Final   Hemoglobin  Date Value Ref Range Status  07/31/2023 15.1 11.7 - 15.5 g/dL Final  64/40/3474 25.9 11.1 - 15.9 g/dL Final   HCT  Date Value Ref Range Status  07/31/2023 44.9 35.0 -  45.0 % Final   Hematocrit  Date Value Ref Range Status  09/05/2020 44.1 34.0 - 46.6 % Final   MCHC  Date Value Ref Range Status  07/31/2023 33.6 32.0 - 36.0 g/dL Final    Comment:    For adults, a slight decrease in the calculated MCHC value (in the range of 30 to 32 g/dL) is most likely not clinically significant; however, it should be interpreted with caution in correlation with other red cell parameters and the patient's clinical condition.    [facility]  Date Value Ref Range Status  07/31/2023 29.8 27.0 - 33.0 pg Final   MCV  Date Value Ref Range Status  07/31/2023 88.6 80.0 - 100.0 fL Final  09/05/2020 85 79 - 97 fL Final   Platelet Count, POC  Date Value Ref Range Status  01/22/2014 365 142 - 424 K/uL Final   RDW  Date Value Ref Range Status  07/31/2023 12.2 11.0 - 15.0 % Final  09/05/2020 12.8 11.7 - 15.4 % Final   RDW, POC  Date Value Ref Range Status  01/22/2014 13.5 % Final

## 2023-11-02 ENCOUNTER — Encounter: Payer: Self-pay | Admitting: Family Medicine

## 2023-11-02 ENCOUNTER — Telehealth: Payer: Self-pay | Admitting: Internal Medicine

## 2023-11-02 NOTE — Telephone Encounter (Signed)
 Inbound call from patient requesting to schedule recall colonoscopy. Recall is dated for 12/2026. Patient last colonoscopy report in 2018 states 5 years. Patient is requesting to know which is accurate so she is able to schedule accordingly. Please advise, thank you.

## 2023-11-02 NOTE — Telephone Encounter (Signed)
 Due to family hx she should have repeated at 5 years  She needs to schedule colonoscopy next available when she can

## 2023-11-02 NOTE — Telephone Encounter (Signed)
 Please reach out to the patient and schedule her. Thank you

## 2023-11-02 NOTE — Telephone Encounter (Signed)
 Please advise on her recall date for the colon cancer screening.

## 2023-11-06 NOTE — Telephone Encounter (Signed)
 Called patient to schedule. States she will call back at a better time to schedule.

## 2023-11-14 ENCOUNTER — Other Ambulatory Visit: Payer: Self-pay | Admitting: Family Medicine

## 2023-11-14 DIAGNOSIS — E1165 Type 2 diabetes mellitus with hyperglycemia: Secondary | ICD-10-CM

## 2023-11-14 NOTE — Telephone Encounter (Signed)
 Requested Prescriptions  Pending Prescriptions Disp Refills   Semaglutide, 2 MG/DOSE, (OZEMPIC, 2 MG/DOSE,) 8 MG/3ML SOPN [Pharmacy Med Name: OZEMPIC 8 MG/3 ML (2 MG/DOSE)] 3 mL 2    Sig: INJECT 2 MG AS DIRECTED ONCE A WEEK.     Endocrinology:  Diabetes - GLP-1 Receptor Agonists - semaglutide Passed - 11/14/2023  5:26 PM      Passed - HBA1C in normal range and within 180 days    Hgb A1C (fingerstick)  Date Value Ref Range Status  03/01/2013 5.0 <5.7 % Final    Comment:                                                                           According to the ADA Clinical Practice Recommendations for 2011, when HbA1c is used as a screening test:     >=6.5%   Diagnostic of Diabetes Mellitus            (if abnormal result is confirmed)   5.7-6.4%   Increased risk of developing Diabetes Mellitus   References:Diagnosis and Classification of Diabetes Mellitus,Diabetes Care,2011,34(Suppl 1):S62-S69 and Standards of Medical Care in         Diabetes - 2011,Diabetes Care,2011,34 (Suppl 1):S11-S61.     Hgb A1c MFr Bld  Date Value Ref Range Status  07/31/2023 5.3 <5.7 % of total Hgb Final    Comment:    For the purpose of screening for the presence of diabetes: . <5.7%       Consistent with the absence of diabetes 5.7-6.4%    Consistent with increased risk for diabetes             (prediabetes) > or =6.5%  Consistent with diabetes . This assay result is consistent with a decreased risk of diabetes. . Currently, no consensus exists regarding use of hemoglobin A1c for diagnosis of diabetes in children. . According to American Diabetes Association (ADA) guidelines, hemoglobin A1c <7.0% represents optimal control in non-pregnant diabetic patients. Different metrics may apply to specific patient populations.  Standards of Medical Care in Diabetes(ADA). .          Passed - Cr in normal range and within 360 days    Creat  Date Value Ref Range Status  07/31/2023 0.63 0.50 - 0.99  mg/dL Final   Creatinine, Urine  Date Value Ref Range Status  07/31/2023 363 (H) 20 - 275 mg/dL Final         Passed - Valid encounter within last 6 months    Recent Outpatient Visits           2 months ago Situational anxiety   [place] [facility] Pickard, Priscille Heidelberg, MD   3 months ago Colon cancer screening   [place] [facility] Donita Brooks, MD   1 year ago Tachycardia   [place] [facility] Donita Brooks, MD   1 year ago Viral URI   [place] [facility] [place] Meo, FNP   1 year ago General medical exam   [place] [facility] Pickard, Priscille Heidelberg, MD

## 2023-12-26 ENCOUNTER — Encounter (INDEPENDENT_AMBULATORY_CARE_PROVIDER_SITE_OTHER): Payer: Self-pay | Admitting: Otolaryngology

## 2023-12-26 ENCOUNTER — Ambulatory Visit (INDEPENDENT_AMBULATORY_CARE_PROVIDER_SITE_OTHER): Payer: BC Managed Care – PPO | Admitting: Otolaryngology

## 2023-12-26 VITALS — BP 106/68 | HR 82

## 2023-12-26 DIAGNOSIS — R490 Dysphonia: Secondary | ICD-10-CM

## 2023-12-26 DIAGNOSIS — J343 Hypertrophy of nasal turbinates: Secondary | ICD-10-CM

## 2023-12-26 DIAGNOSIS — R0981 Nasal congestion: Secondary | ICD-10-CM

## 2023-12-26 DIAGNOSIS — K219 Gastro-esophageal reflux disease without esophagitis: Secondary | ICD-10-CM

## 2023-12-26 DIAGNOSIS — R42 Dizziness and giddiness: Secondary | ICD-10-CM

## 2023-12-26 DIAGNOSIS — J310 Chronic rhinitis: Secondary | ICD-10-CM | POA: Diagnosis not present

## 2023-12-26 MED ORDER — AZELASTINE HCL 0.1 % NA SOLN: 2.0000 | Freq: Two times a day (BID) | NASAL | 12 refills | Status: AC

## 2023-12-26 MED ORDER — FLUTICASONE PROPIONATE 50 MCG/ACT NA SUSP: 2.0000 | Freq: Every day | NASAL | 12 refills | Status: AC

## 2023-12-27 DIAGNOSIS — R490 Dysphonia: Secondary | ICD-10-CM | POA: Insufficient documentation

## 2023-12-27 DIAGNOSIS — J343 Hypertrophy of nasal turbinates: Secondary | ICD-10-CM | POA: Insufficient documentation

## 2023-12-27 DIAGNOSIS — J310 Chronic rhinitis: Secondary | ICD-10-CM | POA: Insufficient documentation

## 2023-12-27 DIAGNOSIS — R42 Dizziness and giddiness: Secondary | ICD-10-CM | POA: Insufficient documentation

## 2023-12-27 NOTE — Progress Notes (Signed)
 Patient ID: Candice Jones, female   DOB: Jun 17, 1976, 48 y.o.   MRN: [id number]  Follow-up: Recurrent dizziness, right ear Mnire's disease, chronic nasal congestion, hoarseness, gastroesophageal reflux  HPI: The patient is a 48 year old female who returns today for her follow-up evaluation.  The patient was previously seen for multiple medical issues, including recurrent dizziness, right ear Mnire's disease, chronic nasal congestion, recurrent hoarseness, and laryngopharyngeal reflux.  The patient was treated with a 1500 mg low-salt diet, Dyazide diuretic, Protonix , Flonase , and azelastine .  The patient returns today reporting no significant dizziness since her last visit.  She continues to have occasional reflux symptoms.  The overall severity has improved with the use of Protonix  daily.  The patient was recently noted to have a hiatal hernia.  Currently she denies any otalgia, otorrhea, facial pain, or fever.  Exam: General: Communicates without difficulty, well nourished, no acute distress. Head: Normocephalic, no evidence injury, no tenderness, facial buttresses intact without stepoff. Eyes: PERRL, EOMI. No scleral icterus, conjunctivae clear. Neuro: CN II exam reveals vision grossly intact. No nystagmus at any point of gaze. Ears: Auricles well formed without lesions. Ear canals are intact without mass or lesion. No erythema or edema is appreciated. The TMs are intact without fluid. Nose: External evaluation reveals normal support and skin without lesions. Dorsum is intact. Anterior rhinoscopy reveals congested mucosa over anterior aspect of inferior turbinates and intact septum. No purulence noted. Oral:  Oral cavity and oropharynx are intact, symmetric, without erythema or edema. Mucosa is moist without lesions. Neck: Full range of motion without pain. There is no significant lymphadenopathy. No masses palpable. Thyroid bed within normal limits to palpation. Parotid glands and submandibular glands equal  bilaterally without mass. Trachea is midline. Neuro:  CN 2-12 grossly intact. Gait normal. Vestibular: No nystagmus at any point of gaze. The cerebellar examination is unremarkable.    Assessment: 1.  Her laryngopharyngeal reflux and hoarseness are currently under control with the use of Protonix  and diet modifications. 2.  The patient's dizziness is also currently under control with the use of low-salt diet and Dyazide diuretic. 3.  Chronic rhinitis with nasal mucosal congestion and turbinate hypertrophy.  Plan: 1.  The physical exam findings are reviewed with the patient. 2.  Continue with her current treatment regimen of Dyazide, low-salt diet, Protonix , diet modifications, Flonase , and azelastine . 3.  The pathophysiology of dizziness and laryngopharyngeal reflux are discussed. 4.  Refills for Flonase  and azelastine . 5.  The patient will return for reevaluation in 1 year, sooner if needed.

## 2024-01-01 ENCOUNTER — Other Ambulatory Visit: Payer: Self-pay | Admitting: Medical Genetics

## 2024-01-05 ENCOUNTER — Other Ambulatory Visit: Payer: Self-pay

## 2024-01-05 ENCOUNTER — Encounter: Payer: Self-pay | Admitting: Family Medicine

## 2024-01-05 DIAGNOSIS — E7800 Pure hypercholesterolemia, unspecified: Secondary | ICD-10-CM

## 2024-01-05 DIAGNOSIS — K219 Gastro-esophageal reflux disease without esophagitis: Secondary | ICD-10-CM

## 2024-01-05 DIAGNOSIS — K589 Irritable bowel syndrome without diarrhea: Secondary | ICD-10-CM

## 2024-01-05 MED ORDER — ROSUVASTATIN CALCIUM 10 MG PO TABS: 10.0000 mg | ORAL_TABLET | Freq: Every day | ORAL | 1 refills | Status: DC

## 2024-01-05 MED ORDER — LOSARTAN POTASSIUM 25 MG PO TABS: 25.0000 mg | ORAL_TABLET | Freq: Every day | ORAL | 3 refills | Status: AC

## 2024-01-05 MED ORDER — PANTOPRAZOLE SODIUM 40 MG PO TBEC
40.0000 mg | DELAYED_RELEASE_TABLET | Freq: Two times a day (BID) | ORAL | 2 refills | Status: AC
Start: 2024-01-05 — End: ?

## 2024-01-05 NOTE — Telephone Encounter (Signed)
 Medications Pantoprazole , Losartan , and rosuvastatin  have been refilled to [facility] on [address]. Potassium is not in the current medication list so it was not ordered at this time.

## 2024-01-08 ENCOUNTER — Ambulatory Visit (AMBULATORY_SURGERY_CENTER)

## 2024-01-08 VITALS — Ht 64.5 in | Wt 143.0 lb

## 2024-01-08 DIAGNOSIS — Z800 Family history of malignant neoplasm of digestive organs: Secondary | ICD-10-CM

## 2024-01-08 MED ORDER — NA SULFATE-K SULFATE-MG SULF 17.5-3.13-1.6 GM/177ML PO SOLN
1.0000 | Freq: Once | ORAL | 0 refills | Status: AC
Start: 2024-01-08 — End: 2024-01-08

## 2024-01-08 NOTE — Progress Notes (Signed)
 No egg or soy allergy known to patient  No issues known to pt with past sedation with any surgeries or procedures Patient denies ever being told they had issues or difficulty with intubation  No FH of Malignant Hyperthermia Pt is not on diet pills; Takes Ozempic  and hold instructions provided Pt is not on  home 02  Pt is not on blood thinners  Pt denies issues with constipation; takes a stool softner daily  No A fib or A flutter Have any cardiac testing pending--No Pt can ambulate  Pt denies use of chewing tobacco Discussed diabetic I weight loss medication holds Discussed NSAID holds Checked BMI Pt instructed to use Singlecare.com or GoodRx for a price reduction on prep  Patient's chart reviewed by Rogena Class CNRA prior to previsit and patient appropriate for the [facility].  Pre visit completed and red dot placed by patient's name on their procedure day (on provider's schedule).

## 2024-01-17 ENCOUNTER — Encounter: Payer: Self-pay | Admitting: Internal Medicine

## 2024-01-27 ENCOUNTER — Encounter: Payer: Self-pay | Admitting: Internal Medicine

## 2024-01-29 ENCOUNTER — Encounter: Admitting: Internal Medicine

## 2024-02-05 ENCOUNTER — Encounter: Admitting: Internal Medicine

## 2024-02-13 ENCOUNTER — Other Ambulatory Visit: Payer: Self-pay | Admitting: Family Medicine

## 2024-02-13 DIAGNOSIS — E1165 Type 2 diabetes mellitus with hyperglycemia: Secondary | ICD-10-CM

## 2024-02-20 ENCOUNTER — Encounter: Payer: Self-pay | Admitting: Family Medicine

## 2024-02-20 ENCOUNTER — Telehealth: Payer: Self-pay

## 2024-02-20 NOTE — Telephone Encounter (Signed)
 Copied from CRM #[id number]. Topic: Clinical - Request for Lab/Test Order >> Feb 20, 2024  1:36 PM Candice Jones wrote: Reason for CRM: I schedule an appt for this patient on 7/23 for A1c check

## 2024-02-21 ENCOUNTER — Other Ambulatory Visit

## 2024-02-21 DIAGNOSIS — E119 Type 2 diabetes mellitus without complications: Secondary | ICD-10-CM

## 2024-02-22 ENCOUNTER — Ambulatory Visit: Payer: Self-pay | Admitting: Family Medicine

## 2024-02-22 LAB — HEMOGLOBIN A1C
Hgb A1c MFr Bld: 5.3 % (ref <5.7)
Mean Plasma Glucose: 105 mg/dL
eAG (mmol/L): 5.8 mmol/L

## 2024-02-27 LAB — HM DIABETES EYE EXAM

## 2024-03-05 ENCOUNTER — Other Ambulatory Visit

## 2024-04-14 NOTE — Progress Notes (Signed)
 [facility] History and Physical   Primary Care Physician:  Duanne Butler DASEN, MD   Reason for Procedure:    Encounter Diagnoses  Name Primary?   Colon cancer screening Yes   Family history of colon cancer - sister < 38      Plan:    Colonoscopy     HPI: Candice Jones is a 48 y.o. female presenting for a higher risk screening colonoscopy, a sister had colon cancer before the age of 75.  2018 exam was without neoplasia.  She has a history of IBS, she also has a history of GERD.   Past Medical History:  Diagnosis Date   Adenomyosis    Asthma    Coronary artery calcification seen on CT scan    a. 03/2023 Cardiac CT: Ca2+ =5.95 in LAD (91st%'ile).   COVID-19    Diabetes (HCC)    Diastolic dysfunction    a. 06/2023 Echo: EF 60-65%, no rwma, GrI DD, nl RV size/fxn, mild MR.   Gall stones    Gestational diabetes mellitus    Hiatal hernia    a. 03/2023 CT Chest: tiny HH.   Hyperlipidemia    IBS (irritable bowel syndrome)    Internal hemorrhoids    Kidney stones    Meniere's syndrome    Vertigo     Past Surgical History:  Procedure Laterality Date   CESAREAN SECTION  08/1999, 05/2003   CHOLECYSTECTOMY  2011   CYSTECTOMY  1997   left wrist    ENDOMETRIAL ABLATION  01/2010   HEMORRHOID BANDING  2015   MINOR HEMORRHOIDECTOMY  04/2014   thrombosed   WISDOM TOOTH EXTRACTION  1996/1997     Current Outpatient Medications  Medication Sig Dispense Refill   azelastine  (ASTELIN ) 0.1 % nasal spray Place 2 sprays into both nostrils 2 (two) times daily. Use in each nostril as directed 30 mL 12   Azelastine  HCl 137 MCG/SPRAY SOLN Place 2 sprays into both nostrils every morning.     Cholecalciferol (VITAMIN D3 PO) Take 1 tablet by mouth every morning.     Cranberry-Vitamin C-Vitamin E (CRANBERRY PLUS VITAMIN C) 4200-20-3 MG-MG-UNIT CAPS Take 2 capsules by mouth every morning.     docusate sodium (COLACE) 100 MG capsule Take 100 mg by mouth every morning.     fluticasone   (FLONASE ) 50 MCG/ACT nasal spray SPRAY 2 SPRAYS INTO EACH NOSTRIL EVERY DAY (Patient taking differently: Place 2 sprays into both nostrils daily as needed for allergies or rhinitis.) 48 mL 1   levocetirizine (XYZAL) 5 MG tablet Take 5 mg by mouth every morning.     losartan  (COZAAR ) 25 MG tablet Take 1 tablet (25 mg total) by mouth daily. 90 tablet 3   metoprolol  succinate (TOPROL -XL) 25 MG 24 hr tablet Take 1 tablet (25 mg total) by mouth daily. 90 tablet 3   Misc Natural Products (ELDERBERRY ZINC/VIT C/IMMUNE MT) Use as directed in the mouth or throat.     Multiple Vitamins-Minerals (IMMUNE SUPPORT) CHEW Chew 2 tablets by mouth every morning.     norethindrone-ethinyl estradiol (LOESTRIN) 1-20 MG-MCG tablet Take 1 tablet by mouth every morning.     pantoprazole  (PROTONIX ) 40 MG tablet Take 1 tablet (40 mg total) by mouth 2 (two) times daily. 180 tablet 2   rosuvastatin  (CRESTOR ) 10 MG tablet Take 1 tablet (10 mg total) by mouth daily. 90 tablet 1   albuterol  (VENTOLIN  HFA) 108 (90 Base) MCG/ACT inhaler Inhale 2 puffs into the lungs every 4 (four)  hours as needed for wheezing or shortness of breath. 18 g 0   albuterol  (VENTOLIN  HFA) 108 (90 Base) MCG/ACT inhaler INHALE 1-2 PUFFS BY MOUTH EVERY 6 HOURS AS NEEDED FOR WHEEZE OR SHORTNESS OF BREATH 8.5 each 1   ALPRAZolam  (XANAX ) 0.5 MG tablet Take 1 tablet (0.5 mg total) by mouth 3 (three) times daily as needed for anxiety. 30 tablet 0   Ascorbic Acid (VITAMIN C PO) Take 1 tablet by mouth every morning. (Patient not taking: Reported on 01/08/2024)     Blood Glucose Monitoring Suppl KIT Use to check fasting glucose daily ICD10: R73.01.Dispense based on insurance preference 1 kit 0   fluticasone  (FLONASE ) 50 MCG/ACT nasal spray Place 2 sprays into both nostrils daily. 16 g 12   glucose blood test strip Use with glucose meter to check blood sugar.ICD10: R73.01.Dispense based on insurance preference 100 each 12   Lancets (FREESTYLE) lancets Use to check  blood sugar ICD10: R73.01.Dispense based on insurance preference 100 each 12   Multiple Vitamin (MULTIVITAMIN WITH MINERALS) TABS tablet Take 1 tablet by mouth every morning. (Patient not taking: Reported on 01/08/2024)     Olopatadine  HCl (PATADAY ) 0.7 % SOLN Apply 1 drop twice daily to the left eye as needed for redness and itching. 5 mL 0   Semaglutide , 2 MG/DOSE, (OZEMPIC , 2 MG/DOSE,) 8 MG/3ML SOPN INJECT 2 MG AS DIRECTED ONCE A WEEK. 3 mL 2   VITAMIN A PO Take 1 capsule by mouth every morning. (Patient not taking: Reported on 01/08/2024)     Current Facility-Administered Medications  Medication Dose Route Frequency Provider Last Rate Last Admin   0.9 %  sodium chloride  infusion  500 mL Intravenous Once Avram Lupita BRAVO, MD        Allergies as of 04/15/2024 - Review Complete 04/15/2024  Allergen Reaction Noted   Augmentin [amoxicillin -pot clavulanate] Nausea And Vomiting 05/12/2011   Ciprofloxacin hcl Rash 06/12/2011   Codeine Other (See Comments) 01/22/2014   Erythromycin Rash 05/12/2011   Sulfa antibiotics Rash 06/05/2021    Family History  Problem Relation Age of Onset   High Cholesterol Mother    Hypertension Mother    Heart disease Mother    Hypertrophic cardiomyopathy Mother    Diabetes Father    High Cholesterol Father    Colon cancer Sister        negative for Lynch syndrome   Diverticulitis Sister    Lupus Sister    Diabetes Maternal Grandmother    Heart disease Maternal Grandmother    Hypertension Maternal Grandmother    High Cholesterol Maternal Grandmother    Kidney cancer Maternal Grandmother    Esophageal cancer Maternal Grandfather    Heart disease Maternal Grandfather    High Cholesterol Maternal Grandfather    Cancer Maternal Grandfather    Diabetes Paternal Grandmother    Prostate cancer Paternal Grandfather    Rectal cancer Neg Hx    Stomach cancer Neg Hx     Social History   Socioeconomic History   Marital status: Married    Spouse name: Not on  file   Number of children: 3   Years of education: Not on file   Highest education level: Bachelor's degree (e.g., BA, AB, BS)  Occupational History   Occupation: Energy manager: [employer]  Tobacco Use   Smoking status: Never   Smokeless tobacco: Never  Vaping Use   Vaping status: Never Used  Substance and Sexual Activity   Alcohol use: Not  Currently    Comment: rarely   Drug use: No   Sexual activity: Yes  Other Topics Concern   Not on file  Social History Narrative   Married, 2 sons and 1 daughter   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   No caffeine alcohol tobacco or drugs   Social Drivers of Corporate investment banker Strain: Low Risk  (09/04/2023)   Overall Financial Resource Strain (CARDIA)    Difficulty of Paying Living Expenses: Not hard at all  Food Insecurity: No Food Insecurity (09/04/2023)   Hunger Vital Sign    Worried About Running Out of Food in the Last Year: Never true    Ran Out of Food in the Last Year: Never true  Transportation Needs: No Transportation Needs (09/04/2023)   PRAPARE - Administrator, Civil Service (Medical): No    Lack of Transportation (Non-Medical): No  Physical Activity: Unknown (09/04/2023)   Exercise Vital Sign    Days of Exercise per Week: 0 days    Minutes of Exercise per Session: Not on file  Stress: Stress Concern Present (09/04/2023)   [employer] of Occupational Health - Occupational Stress Questionnaire    Feeling of Stress : Rather much  Social Connections: Socially Integrated (09/04/2023)   Social Connection and Isolation Panel    Frequency of Communication with Friends and Family: More than three times a week    Frequency of Social Gatherings with Friends and Family: Once a week    Attends Religious Services: More than 4 times per year    Active Member of [employer] or Organizations: Yes    Attends Banker Meetings: Never    Marital Status: Married   Catering manager Violence: Not on file    Review of Systems:  All other review of systems negative except as mentioned in the HPI.  Physical Exam: Vital signs BP 115/75   Pulse 92   Temp 97.9 F (36.6 C)   Resp 10   Ht 5' 4.5 (1.638 m)   Wt 143 lb (64.9 kg)   SpO2 100%   BMI 24.17 kg/m   General:   Alert,  Well-developed, well-nourished, pleasant and cooperative in NAD Lungs:  Clear throughout to auscultation.   Heart:  Regular rate and rhythm; no murmurs, clicks, rubs,  or gallops. Abdomen:  Soft, nontender and nondistended. Normal bowel sounds.   Neuro/Psych:  Alert and cooperative. Normal mood and affect. A and O x 3   @Yvonda Fouty  Candice Commander, MD, [facility] [phone] (pager) 04/15/2024 9:17 AM@

## 2024-04-15 ENCOUNTER — Ambulatory Visit: Admitting: Internal Medicine

## 2024-04-15 ENCOUNTER — Encounter: Payer: Self-pay | Admitting: Internal Medicine

## 2024-04-15 VITALS — BP 115/65 | HR 89 | Temp 97.9°F | Resp 10 | Ht 64.5 in | Wt 143.0 lb

## 2024-04-15 DIAGNOSIS — Z800 Family history of malignant neoplasm of digestive organs: Secondary | ICD-10-CM

## 2024-04-15 DIAGNOSIS — Z1211 Encounter for screening for malignant neoplasm of colon: Secondary | ICD-10-CM | POA: Diagnosis present

## 2024-04-15 DIAGNOSIS — K5730 Diverticulosis of large intestine without perforation or abscess without bleeding: Secondary | ICD-10-CM | POA: Diagnosis not present

## 2024-04-15 MED ORDER — SODIUM CHLORIDE 0.9 % IV SOLN: 500.0000 mL | Freq: Once | INTRAVENOUS | Status: DC

## 2024-04-15 NOTE — Progress Notes (Signed)
 Sedate, gd SR, tolerated procedure well, VSS, report to RN

## 2024-04-15 NOTE — Op Note (Signed)
 [facility] Patient Name: Candice Jones Procedure Date: 04/15/2024 8:59 AM MRN: [id number] Endoscopist: Lupita FORBES Commander , MD, [id number] Age: 48 Referring MD:  Date of Birth: 05/21/1976 Gender: Female Account #: [id number] Procedure:                Colonoscopy Indications:              Screening in patient at increased risk: Colorectal                            cancer in sister before age 70 Medicines:                Monitored Anesthesia Care Procedure:                Pre-Anesthesia Assessment:                           - Prior to the procedure, a History and Physical                            was performed, and patient medications and                            allergies were reviewed. The patient's tolerance of                            previous anesthesia was also reviewed. The risks                            and benefits of the procedure and the sedation                            options and risks were discussed with the patient.                            All questions were answered, and informed consent                            was obtained. Prior Anticoagulants: The patient has                            taken no anticoagulant or antiplatelet agents. ASA                            Grade Assessment: II - A patient with mild systemic                            disease. After reviewing the risks and benefits,                            the patient was deemed in satisfactory condition to                            undergo the procedure.  After obtaining informed consent, the colonoscope                            was passed under direct vision. Throughout the                            procedure, the patient's blood pressure, pulse, and                            oxygen saturations were monitored continuously. The                            Olympus Scope [id number] was introduced through the                            anus and advanced to the the  cecum, identified by                            appendiceal orifice and ileocecal valve. The                            colonoscopy was performed without difficulty. The                            patient tolerated the procedure well. The quality                            of the bowel preparation was good. The ileocecal                            valve, appendiceal orifice, and rectum were                            photographed. The bowel preparation used was SUPREP                            via split dose instruction. Scope In: 9:20:05 AM Scope Out: 9:33:55 AM Scope Withdrawal Time: 0 hours 9 minutes 17 seconds  Total Procedure Duration: 0 hours 13 minutes 50 seconds  Findings:                 The perianal and digital rectal examinations were                            normal.                           A few medium-mouthed diverticula were found in the                            sigmoid colon.                           The exam was otherwise without abnormality on  direct and retroflexion views. Complications:            No immediate complications. Estimated Blood Loss:     Estimated blood loss: none. Impression:               - Diverticulosis in the sigmoid colon.                           - The examination was otherwise normal on direct                            and retroflexion views.                           - No specimens collected. Recommendation:           - Patient has a contact number available for                            emergencies. The signs and symptoms of potential                            delayed complications were discussed with the                            patient. Return to normal activities tomorrow.                            Written discharge instructions were provided to the                            patient.                           - Resume previous diet.                           - Continue present medications.                            - Repeat colonoscopy in 5 years for screening                            purposes. Lupita FORBES Commander, MD 04/15/2024 9:38:48 AM This report has been signed electronically.

## 2024-04-15 NOTE — Progress Notes (Signed)
 Pt's states no medical or surgical changes since previsit or office visit.

## 2024-04-15 NOTE — Patient Instructions (Addendum)
 No polyps or cancer were seen.  There was some diverticulosis found.  Your next routine colonoscopy should be in 5 years - 2030.  I appreciate the opportunity to care for you. Lupita CHARLENA Commander, MD, FACG  YOU HAD AN ENDOSCOPIC PROCEDURE TODAY AT THE [facility]:   Refer to the procedure report that was given to you for any specific questions about what was found during the examination.  If the procedure report does not answer your questions, please call your gastroenterologist to clarify.  If you requested that your care partner not be given the details of your procedure findings, then the procedure report has been included in a sealed envelope for you to review at your convenience later.  YOU SHOULD EXPECT: Some feelings of bloating in the abdomen. Passage of more gas than usual.  Walking can help get rid of the air that was put into your GI tract during the procedure and reduce the bloating. If you had a lower endoscopy (such as a colonoscopy or flexible sigmoidoscopy) you may notice spotting of blood in your stool or on the toilet paper. If you underwent a bowel prep for your procedure, you may not have a normal bowel movement for a few days.  Please Note:  You might notice some irritation and congestion in your nose or some drainage.  This is from the oxygen used during your procedure.  There is no need for concern and it should clear up in a day or so.  SYMPTOMS TO REPORT IMMEDIATELY:  Following lower endoscopy (colonoscopy or flexible sigmoidoscopy):  Excessive amounts of blood in the stool  Significant tenderness or worsening of abdominal pains  Swelling of the abdomen that is new, acute  Fever of 100F or higher  For urgent or emergent issues, a gastroenterologist can be reached at any hour by calling [phone]. Do not use MyChart messaging for urgent concerns.    DIET:  We do recommend a small meal at first, but then you may proceed to your regular diet.  Drink  plenty of fluids but you should avoid alcoholic beverages for 24 hours.  ACTIVITY:  You should plan to take it easy for the rest of today and you should NOT DRIVE or use heavy machinery until tomorrow (because of the sedation medicines used during the test).    FOLLOW UP: Our staff will call the number listed on your records the next business day following your procedure.  We will call around 7:15- 8:00 am to check on you and address any questions or concerns that you may have regarding the information given to you following your procedure. If we do not reach you, we will leave a message.     If any biopsies were taken you will be contacted by phone or by letter within the next 1-3 weeks.  Please call us  at [phone] if you have not heard about the biopsies in 3 weeks.    SIGNATURES/CONFIDENTIALITY: You and/or your care partner have signed paperwork which will be entered into your electronic medical record.  These signatures attest to the fact that that the information above on your After Visit Summary has been reviewed and is understood.  Full responsibility of the confidentiality of this discharge information lies with you and/or your care-partner.

## 2024-04-16 ENCOUNTER — Telehealth: Payer: Self-pay | Admitting: *Deleted

## 2024-04-16 NOTE — Telephone Encounter (Signed)
  Follow up Call-     04/15/2024    8:38 AM 02/16/2022    1:27 PM  Call back number  Post procedure Call Back phone  # [phone]  Permission to leave phone message Yes Yes     Patient questions:  Do you have a fever, pain , or abdominal swelling? No. Pain Score  0 *  Have you tolerated food without any problems? Yes.    Have you been able to return to your normal activities? Yes.    Do you have any questions about your discharge instructions: Diet   No. Medications  No. Follow up visit  No.  Do you have questions or concerns about your Care? No.  Actions: * If pain score is 4 or above: No action needed, pain <4.

## 2024-05-07 ENCOUNTER — Other Ambulatory Visit: Payer: Self-pay | Admitting: Family Medicine

## 2024-05-07 DIAGNOSIS — E1165 Type 2 diabetes mellitus with hyperglycemia: Secondary | ICD-10-CM

## 2024-05-21 ENCOUNTER — Other Ambulatory Visit: Payer: Self-pay | Admitting: Medical Genetics

## 2024-05-21 DIAGNOSIS — Z006 Encounter for examination for normal comparison and control in clinical research program: Secondary | ICD-10-CM

## 2024-06-06 ENCOUNTER — Telehealth (INDEPENDENT_AMBULATORY_CARE_PROVIDER_SITE_OTHER): Payer: Self-pay

## 2024-06-06 ENCOUNTER — Other Ambulatory Visit (INDEPENDENT_AMBULATORY_CARE_PROVIDER_SITE_OTHER): Payer: Self-pay | Admitting: Otolaryngology

## 2024-06-06 MED ORDER — PREDNISONE 10 MG (21) PO TBPK: ORAL_TABLET | ORAL | 0 refills | Status: AC

## 2024-06-06 NOTE — Telephone Encounter (Signed)
 Patient called and LVM explaining that she is having ear issues. She wants to know if she can get a prescription filled for Prednisone . She says that Dr. Karis told her to give us  a call if she has any issues with her ears. She says that she cannot come in to the office today because she is busy.

## 2024-06-10 ENCOUNTER — Encounter: Payer: Self-pay | Admitting: Family Medicine

## 2024-06-11 ENCOUNTER — Other Ambulatory Visit: Payer: Self-pay | Admitting: Family Medicine

## 2024-06-11 MED ORDER — CEFDINIR 300 MG PO CAPS: 300.0000 mg | ORAL_CAPSULE | Freq: Two times a day (BID) | ORAL | 0 refills | Status: DC

## 2024-06-14 ENCOUNTER — Encounter: Payer: Self-pay | Admitting: Nurse Practitioner

## 2024-06-14 ENCOUNTER — Ambulatory Visit: Attending: Nurse Practitioner | Admitting: Nurse Practitioner

## 2024-06-14 VITALS — BP 100/60 | HR 88 | Ht 64.5 in | Wt 142.5 lb

## 2024-06-14 DIAGNOSIS — Z8249 Family history of ischemic heart disease and other diseases of the circulatory system: Secondary | ICD-10-CM

## 2024-06-14 DIAGNOSIS — E7800 Pure hypercholesterolemia, unspecified: Secondary | ICD-10-CM | POA: Diagnosis not present

## 2024-06-14 DIAGNOSIS — I2510 Atherosclerotic heart disease of native coronary artery without angina pectoris: Secondary | ICD-10-CM | POA: Diagnosis not present

## 2024-06-14 DIAGNOSIS — I10 Essential (primary) hypertension: Secondary | ICD-10-CM | POA: Diagnosis not present

## 2024-06-14 NOTE — Progress Notes (Signed)
 Office Visit    Patient Name: Candice Jones Date of Encounter: 06/14/2024  Primary Care Provider:  Duanne Butler DASEN, MD Primary Cardiologist:  Redell Cave, MD    Chief Complaint    48 y.o. female with a history of hypertension, hyperlipidemia, diabetes, mild coronary calcium , asthma, and family history of hypertrophic cardiomyopathy, who presents for cardiology follow-up.  Past Medical History   Subjective   Past Medical History:  Diagnosis Date   Adenomyosis    Asthma    Coronary artery calcification seen on CT scan    a. 03/2023 Cardiac CT: Ca2+ =5.95 in LAD (91st%'ile).   COVID-19    Diabetes (HCC)    Diastolic dysfunction    a. 06/2023 Echo: EF 60-65%, no rwma, GrI DD, nl RV size/fxn, mild MR.   Gall stones    Gestational diabetes mellitus    Hiatal hernia    a. 03/2023 CT Chest: tiny HH.   Hyperlipidemia    IBS (irritable bowel syndrome)    Internal hemorrhoids    Kidney stones    Meniere's syndrome    Vertigo    Past Surgical History:  Procedure Laterality Date   CESAREAN SECTION  08/1999, 05/2003   CHOLECYSTECTOMY  2011   CYSTECTOMY  1997   left wrist    ENDOMETRIAL ABLATION  01/2010   HEMORRHOID BANDING  2015   MINOR HEMORRHOIDECTOMY  04/2014   thrombosed   WISDOM TOOTH EXTRACTION  1996/1997    Allergies  Allergies  Allergen Reactions   Augmentin [Amoxicillin -Pot Clavulanate] Nausea And Vomiting    Severe nausea/vomiting    Ciprofloxacin Hcl Rash   Codeine Other (See Comments)    Makes pain worse   Erythromycin Rash   Sulfa Antibiotics Rash       History of Present Illness    48 y.o. female with a history of hypertension, hyperlipidemia, diabetes, mild coronary calcium , asthma, and family history of hypertrophic cardiomyopathy.  In August 2024, she underwent cardiac CT for calcium  scoring, which showed a score of 5.95, placing her in the 91st percentile for age/sex, and she was placed on low-dose aspirin and statin therapy.  She  established care with Dr. Cave in November 2024 and reported occasional chest pain in the setting of asthma attack or prior COVID infection.  She also reported that her mother has a history of hypertrophic cardiomyopathy.  An echocardiogram was performed, which showed an EF of 60 to 65% without regional wall motion abnormalities, grade 1 diastolic dysfunction, normal RV size and function, and mild MR.     She was last seen in the cardiology clinic one  year ago at which time she was doing well, having lost a significant amount of weight. At that time we started her on daily aspirin due to multiple risk factors.  Since her last visit, weight is down an additional 15 pounds.  She is down 70 pounds overall.  She says that she has not felt this well in 15 years and notes very good energy.  She is active throughout the day, teaching fifth grade science.  She is on her feet and interactive all day long but is not necessarily routinely exercising.  She denies chest pain, palpitations, dyspnea, PND, orthopnea, dizziness, syncope, edema, or early satiety. Objective   Home Medications    Current Outpatient Medications  Medication Sig Dispense Refill   albuterol  (VENTOLIN  HFA) 108 (90 Base) MCG/ACT inhaler Inhale 2 puffs into the lungs every 4 (four) hours as needed for  wheezing or shortness of breath. 18 g 0   albuterol  (VENTOLIN  HFA) 108 (90 Base) MCG/ACT inhaler INHALE 1-2 PUFFS BY MOUTH EVERY 6 HOURS AS NEEDED FOR WHEEZE OR SHORTNESS OF BREATH 8.5 each 1   ALPRAZolam  (XANAX ) 0.5 MG tablet Take 1 tablet (0.5 mg total) by mouth 3 (three) times daily as needed for anxiety. 30 tablet 0   azelastine  (ASTELIN ) 0.1 % nasal spray Place 2 sprays into both nostrils 2 (two) times daily. Use in each nostril as directed 30 mL 12   Azelastine  HCl 137 MCG/SPRAY SOLN Place 2 sprays into both nostrils every morning.     Blood Glucose Monitoring Suppl KIT Use to check fasting glucose daily ICD10: R73.01.Dispense based  on insurance preference 1 kit 0   cefdinir  (OMNICEF ) 300 MG capsule Take 1 capsule (300 mg total) by mouth 2 (two) times daily. 20 capsule 0   Cholecalciferol (VITAMIN D3 PO) Take 1 tablet by mouth every morning.     Cranberry-Vitamin C-Vitamin E (CRANBERRY PLUS VITAMIN C) 4200-20-3 MG-MG-UNIT CAPS Take 2 capsules by mouth every morning.     docusate sodium (COLACE) 100 MG capsule Take 100 mg by mouth every morning.     fluticasone  (FLONASE ) 50 MCG/ACT nasal spray SPRAY 2 SPRAYS INTO EACH NOSTRIL EVERY DAY (Patient taking differently: Place 2 sprays into both nostrils daily as needed for allergies or rhinitis.) 48 mL 1   fluticasone  (FLONASE ) 50 MCG/ACT nasal spray Place 2 sprays into both nostrils daily. 16 g 12   glucose blood test strip Use with glucose meter to check blood sugar.ICD10: R73.01.Dispense based on insurance preference 100 each 12   Lancets (FREESTYLE) lancets Use to check blood sugar ICD10: R73.01.Dispense based on insurance preference 100 each 12   levocetirizine (XYZAL) 5 MG tablet Take 5 mg by mouth every morning.     losartan  (COZAAR ) 25 MG tablet Take 1 tablet (25 mg total) by mouth daily. 90 tablet 3   metoprolol  succinate (TOPROL -XL) 25 MG 24 hr tablet Take 1 tablet (25 mg total) by mouth daily. 90 tablet 3   Misc Natural Products (ELDERBERRY ZINC/VIT C/IMMUNE MT) Use as directed in the mouth or throat.     Multiple Vitamin (MULTIVITAMIN WITH MINERALS) TABS tablet Take 1 tablet by mouth every morning.     Multiple Vitamins-Minerals (IMMUNE SUPPORT) CHEW Chew 2 tablets by mouth every morning.     norethindrone-ethinyl estradiol (LOESTRIN) 1-20 MG-MCG tablet Take 1 tablet by mouth every morning.     Olopatadine  HCl (PATADAY ) 0.7 % SOLN Apply 1 drop twice daily to the left eye as needed for redness and itching. 5 mL 0   pantoprazole  (PROTONIX ) 40 MG tablet Take 1 tablet (40 mg total) by mouth 2 (two) times daily. 180 tablet 2   predniSONE  (STERAPRED UNI-PAK 21 TAB) 10 MG (21)  TBPK tablet Per instructions (Patient not taking: Reported on 06/14/2024) 21 tablet 0   rosuvastatin  (CRESTOR ) 10 MG tablet Take 1 tablet (10 mg total) by mouth daily. 90 tablet 1   Semaglutide , 2 MG/DOSE, (OZEMPIC , 2 MG/DOSE,) 8 MG/3ML SOPN INJECT 2 MG AS DIRECTED ONCE A WEEK. 3 mL 2   Ascorbic Acid (VITAMIN C PO) Take 1 tablet by mouth every morning. (Patient not taking: Reported on 06/14/2024)     VITAMIN A PO Take 1 capsule by mouth every morning. (Patient not taking: Reported on 06/14/2024)     No current facility-administered medications for this visit.     Physical Exam    VS:  BP 100/60 (BP Location: Left Arm, Patient Position: Sitting, Cuff Size: Normal)   Pulse 88   Ht 5' 4.5 (1.638 m)   Wt 142 lb 8 oz (64.6 kg)   SpO2 99%   BMI 24.08 kg/m  , BMI Body mass index is 24.08 kg/m.          GEN: Well nourished, well developed, in no acute distress. HEENT: normal. Neck: Supple, no JVD, carotid bruits, or masses. Cardiac: RRR, no murmurs, rubs, or gallops. No clubbing, cyanosis, edema.  Radials 2+/PT 2+ and equal bilaterally.  Respiratory:  Respirations regular and unlabored, clear to auscultation bilaterally. GI: Soft, nontender, nondistended, BS + x 4. MS: no deformity or atrophy. Skin: warm and dry, no rash. Neuro:  Strength and sensation are intact. Psych: Normal affect.  Accessory Clinical Findings    ECG personally reviewed by me today - EKG Interpretation Date/Time:  Friday June 14 2024 15:42:06 EST Ventricular Rate:  88 PR Interval:  142 QRS Duration:  74 QT Interval:  342 QTC Calculation: 413 R Axis:   44  Text Interpretation: Normal sinus rhythm Normal ECG Confirmed by Vivienne Bruckner [phone]) on 06/14/2024 4:26:02 PM  - no acute changes.  Lab Results  Component Value Date   WBC 7.7 07/31/2023   HGB 15.1 07/31/2023   HCT 44.9 07/31/2023   MCV 88.6 07/31/2023   PLT 361 07/31/2023   Lab Results  Component Value Date   CREATININE 0.63  07/31/2023   BUN 11 07/31/2023   NA 142 07/31/2023   K 3.6 07/31/2023   CL 105 07/31/2023   CO2 26 07/31/2023   Lab Results  Component Value Date   ALT 14 07/31/2023   AST 15 07/31/2023   ALKPHOS 77 09/05/2020   BILITOT 0.5 07/31/2023   Lab Results  Component Value Date   CHOL 109 07/31/2023   HDL 39 (L) 07/31/2023   LDLCALC 54 07/31/2023   TRIG 79 07/31/2023   CHOLHDL 2.8 07/31/2023    Lab Results  Component Value Date   HGBA1C 5.3 02/21/2024   Lab Results  Component Value Date   TSH 1.44 11/03/2022       Assessment & Plan    1.  Coronary calcifications/family history of premature coronary artery disease: Patient's grandfather with CABG in his 30s. Calcium  scoring 03/2023 showing 5.95, involving the LAD and placing her at 91st percentile for age and sex.  She is active throughout the day without chest pain or dyspnea.  She has lost a significant amount of weight and notes that she remains on a relatively Mediterranean diet with fish and vegetables.  She remains on statin therapy with an LDL of 54 last December.  2. Primary hypertension: Blood pressure well-controlled today on metoprolol  and losartan .  3. Hyperlipidemia: LDL of 54 last December.  She is due for follow-up lab work later this this year.  She remains on low-dose rosuvastatin .  4. Type 2 diabetes mellitus: Last A1c 5.3 in July 2025, down from 6.4 the year prior.  She attributes this to Ozempic  and weight loss.  5.  Family history of hypertrophic cardiomyopathy: Normal LV function with grade 1 diastolic dysfunction on echo last year.  Asymptomatic.  Heart rate and blood pressure stable.  6.  Disposition: Follow-up in 1 year or sooner if necessary.  Bruckner Vivienne, NP 06/14/2024, 6:17 PM

## 2024-06-14 NOTE — Patient Instructions (Signed)
 Medication Instructions:  No changes *If you need a refill on your cardiac medications before your next appointment, please call your pharmacy*  Lab Work: None ordered If you have labs (blood work) drawn today and your tests are completely normal, you will receive your results only by: MyChart Message (if you have MyChart) OR A paper copy in the mail If you have any lab test that is abnormal or we need to change your treatment, we will call you to review the results.  Testing/Procedures: None ordered  Follow-Up: At [facility], you and your health needs are our priority.  As part of our continuing mission to provide you with exceptional heart care, our providers are all part of one team.  This team includes your primary Cardiologist (physician) and Advanced Practice Providers or APPs (Physician Assistants and Nurse Practitioners) who all work together to provide you with the care you need, when you need it.  Your next appointment:   12 month(s)  Provider:   You may see Constancia Delton, MD or one of the following Advanced Practice Providers on your designated Care Team:   Laneta Pintos, NP  We recommend signing up for the patient portal called "MyChart".  Sign up information is provided on this After Visit Summary.  MyChart is used to connect with patients for Virtual Visits (Telemedicine).  Patients are able to view lab/test results, encounter notes, upcoming appointments, etc.  Non-urgent messages can be sent to your provider as well.   To learn more about what you can do with MyChart, go to [email or web address].

## 2024-06-27 ENCOUNTER — Other Ambulatory Visit: Payer: Self-pay | Admitting: Family Medicine

## 2024-06-27 DIAGNOSIS — E7800 Pure hypercholesterolemia, unspecified: Secondary | ICD-10-CM

## 2024-06-28 ENCOUNTER — Encounter (INDEPENDENT_AMBULATORY_CARE_PROVIDER_SITE_OTHER): Payer: Self-pay | Admitting: Otolaryngology

## 2024-06-28 ENCOUNTER — Ambulatory Visit (INDEPENDENT_AMBULATORY_CARE_PROVIDER_SITE_OTHER): Admitting: Otolaryngology

## 2024-06-28 VITALS — BP 110/71 | HR 88 | Temp 97.6°F

## 2024-06-28 DIAGNOSIS — H6983 Other specified disorders of Eustachian tube, bilateral: Secondary | ICD-10-CM | POA: Diagnosis not present

## 2024-06-28 DIAGNOSIS — J310 Chronic rhinitis: Secondary | ICD-10-CM

## 2024-06-28 DIAGNOSIS — J309 Allergic rhinitis, unspecified: Secondary | ICD-10-CM | POA: Diagnosis not present

## 2024-06-28 DIAGNOSIS — H8101 Meniere's disease, right ear: Secondary | ICD-10-CM | POA: Diagnosis not present

## 2024-06-28 DIAGNOSIS — R42 Dizziness and giddiness: Secondary | ICD-10-CM

## 2024-06-28 DIAGNOSIS — J343 Hypertrophy of nasal turbinates: Secondary | ICD-10-CM

## 2024-06-28 MED ORDER — PREDNISONE 10 MG (21) PO TBPK: ORAL_TABLET | ORAL | 0 refills | Status: AC

## 2024-06-28 MED ORDER — TRIAMTERENE-HCTZ 37.5-25 MG PO CAPS: 1.0000 | ORAL_CAPSULE | Freq: Every day | ORAL | 3 refills | Status: AC

## 2024-06-28 NOTE — Progress Notes (Signed)
 Patient ID: Candice Jones, female   DOB: February 10, 1976, 48 y.o.   MRN: [id number]  Cc: Bilateral ear infection, right ear Mnire's disease, chronic nasal congestion, bilateral tinnitus  History of Present Illness Candice Jones is a 48 year old female with Meniere's disease who presents with persistent ear symptoms.  Her new symptoms began on October 30th after a trip to the mountains, where she experienced a sensation of her ears not 'popping' properly, followed by buzzing, fullness, and echoing in her ears. These symptoms have persisted for four weeks.  She has a history of allergies and was experiencing severe allergy symptoms at the time her ear symptoms began. She has been using Flonase  and azelastine  daily to manage her allergies. She also takes Xyzal at night. Her fall allergies have been particularly severe this year, with symptoms including itchy eyes. She dislikes using Pataday  due to its side effects on her eyelashes.  She was prescribed prednisone , which usually alleviates her symptoms, but this time it did not fully resolve them. Towards the end of the prednisone  course, she developed upper tooth pain and ear pain, which was diagnosed as a double ear infection by her primary care doctor. She was treated with antibiotics for ten days.  She describes feeling exhausted, experiencing pressure, overstimulation, and occasional nausea, which she associates with her Meniere's disease. She has not experienced spinning or dizziness recently, but in the past, severe episodes have led to vomiting and an inability to function.  She adheres to a low salt diet of 1500 mg per day to manage her Meniere's disease. She has previously been on triamterene but was taken off it due to a period without issues. She has recently used dandelion root as a natural diuretic, which she feels has helped with fluid retention.   Exam: General: Communicates without difficulty, well nourished, no acute distress. Head:  Normocephalic, no evidence injury, no tenderness, facial buttresses intact without stepoff. Face/sinus: No tenderness to palpation and percussion. Facial movement is normal and symmetric. Eyes: PERRL, EOMI. No scleral icterus, conjunctivae clear. Neuro: CN II exam reveals vision grossly intact.  No nystagmus at any point of gaze. Ears: Auricles well formed without lesions.  Ear canals are intact without mass or lesion.  No erythema or edema is appreciated.  The TMs are intact without fluid. Nose: External evaluation reveals normal support and skin without lesions.  Dorsum is intact.  Anterior rhinoscopy reveals congested mucosa over anterior aspect of inferior turbinates and intact septum.  No purulence noted. Oral:  Oral cavity and oropharynx are intact, symmetric, without erythema or edema.  Mucosa is moist without lesions. Neck: Full range of motion without pain.  There is no significant lymphadenopathy.  No masses palpable.  Thyroid bed within normal limits to palpation.  Parotid glands and submandibular glands equal bilaterally without mass.  Trachea is midline. Neuro:  CN 2-12 grossly intact.    Assessment and Plan Assessment & Plan Right ear Mnire's disease Symptoms of ear fullness, buzzing, and echoey sounds. No current dizziness or spinning. Concerns about potential exacerbation due to recent ear issues. Previous episodes involved severe dizziness and vomiting. Current symptoms are less severe but persistent. - Continue low salt diet (1500 mg/day) - Prescribed prednisone  for 6 days to manage symptoms - Reintroduced Dyazide to manage fluid retention and prevent exacerbation  Bilateral eustachian tube dysfunction with middle ear congestion Likely secondary to recent travel and allergies. Symptoms include ear fullness and echoey sounds. No current ear infection or fluid observed.  Symptoms have persisted for four weeks. Valsalva maneuver performed but worsened symptoms initially. - Instructed to  perform Valsalva maneuver 20-30 times daily to improve eustachian tube function - Continue Flonase  and azelastine  for nasal congestion  Allergic rhinitis with nasal and ocular symptoms Allergic rhinitis with nasal congestion and itchy eyes. Symptoms exacerbated by recent travel and environmental changes. Current use of Flonase  and azelastine . Pataday  used for ocular symptoms but causes eyelash stickiness. - Continue Flonase  and azelastine  for nasal and ocular symptoms - Consider wiping eyes after using Pataday  to prevent eyelash stickiness - Prescribed prednisone  to manage acute allergic symptoms

## 2024-07-26 ENCOUNTER — Encounter: Payer: Self-pay | Admitting: Family Medicine

## 2024-07-30 ENCOUNTER — Other Ambulatory Visit: Payer: Self-pay | Admitting: Family Medicine

## 2024-07-30 DIAGNOSIS — E1165 Type 2 diabetes mellitus with hyperglycemia: Secondary | ICD-10-CM

## 2024-07-31 ENCOUNTER — Other Ambulatory Visit: Payer: Self-pay | Admitting: Family Medicine

## 2024-08-02 ENCOUNTER — Other Ambulatory Visit

## 2024-08-02 DIAGNOSIS — K589 Irritable bowel syndrome without diarrhea: Secondary | ICD-10-CM

## 2024-08-02 DIAGNOSIS — E1165 Type 2 diabetes mellitus with hyperglycemia: Secondary | ICD-10-CM

## 2024-08-02 DIAGNOSIS — E7800 Pure hypercholesterolemia, unspecified: Secondary | ICD-10-CM

## 2024-08-02 DIAGNOSIS — Z0000 Encounter for general adult medical examination without abnormal findings: Secondary | ICD-10-CM

## 2024-08-02 DIAGNOSIS — Z0184 Encounter for antibody response examination: Secondary | ICD-10-CM

## 2024-08-03 LAB — CBC WITH DIFFERENTIAL/PLATELET
Absolute Lymphocytes: 2980 {cells}/uL (ref 850–3900)
Absolute Monocytes: 556 {cells}/uL (ref 200–950)
Basophils Absolute: 58 {cells}/uL (ref 0–200)
Basophils Relative: 0.7 %
Eosinophils Absolute: 83 {cells}/uL (ref 15–500)
Eosinophils Relative: 1 %
HCT: 45.9 % (ref 35.9–46.0)
Hemoglobin: 15.2 g/dL (ref 11.7–15.5)
MCH: 29.9 pg (ref 27.0–33.0)
MCHC: 33.1 g/dL (ref 31.6–35.4)
MCV: 90.4 fL (ref 81.4–101.7)
MPV: 8.9 fL (ref 7.5–12.5)
Monocytes Relative: 6.7 %
Neutro Abs: 4623 {cells}/uL (ref 1500–7800)
Neutrophils Relative %: 55.7 %
Platelets: 439 Thousand/uL — ABNORMAL HIGH (ref 140–400)
RBC: 5.08 Million/uL (ref 3.80–5.10)
RDW: 12.3 % (ref 11.0–15.0)
Total Lymphocyte: 35.9 %
WBC: 8.3 Thousand/uL (ref 3.8–10.8)

## 2024-08-03 LAB — COMPLETE METABOLIC PANEL WITHOUT GFR
AG Ratio: 1.6 (calc) (ref 1.0–2.5)
ALT: 14 U/L (ref 6–29)
AST: 15 U/L (ref 10–35)
Albumin: 4 g/dL (ref 3.6–5.1)
Alkaline phosphatase (APISO): 73 U/L (ref 31–125)
BUN: 13 mg/dL (ref 7–25)
CO2: 27 mmol/L (ref 20–32)
Calcium: 8.8 mg/dL (ref 8.6–10.2)
Chloride: 103 mmol/L (ref 98–110)
Creat: 0.63 mg/dL (ref 0.50–0.99)
Globulin: 2.5 g/dL (ref 1.9–3.7)
Glucose, Bld: 103 mg/dL — ABNORMAL HIGH (ref 65–99)
Potassium: 3.7 mmol/L (ref 3.5–5.3)
Sodium: 141 mmol/L (ref 135–146)
Total Bilirubin: 0.5 mg/dL (ref 0.2–1.2)
Total Protein: 6.5 g/dL (ref 6.1–8.1)

## 2024-08-03 LAB — LIPID PANEL
Cholesterol: 126 mg/dL
HDL: 46 mg/dL — ABNORMAL LOW
LDL Cholesterol (Calc): 65 mg/dL
Non-HDL Cholesterol (Calc): 80 mg/dL
Total CHOL/HDL Ratio: 2.7 (calc)
Triglycerides: 74 mg/dL

## 2024-08-03 LAB — HEMOGLOBIN A1C
Hgb A1c MFr Bld: 6 % — ABNORMAL HIGH
Mean Plasma Glucose: 126 mg/dL
eAG (mmol/L): 7 mmol/L

## 2024-08-03 LAB — MEASLES/MUMPS/RUBELLA IMMUNITY
Mumps IgG: 117 [AU]/ml
Rubella: 2.25 {index}
Rubeola IgG: 63.9 [AU]/ml

## 2024-08-03 LAB — MICROALBUMIN / CREATININE URINE RATIO
Creatinine, Urine: 394 mg/dL — ABNORMAL HIGH (ref 20–275)
Microalb Creat Ratio: 7 mg/g{creat}
Microalb, Ur: 2.6 mg/dL

## 2024-08-03 LAB — VITAMIN B12: Vitamin B-12: 400 pg/mL (ref 200–1100)

## 2024-08-05 ENCOUNTER — Ambulatory Visit: Payer: Self-pay | Admitting: Family Medicine

## 2024-08-05 ENCOUNTER — Encounter: Payer: Self-pay | Admitting: Family Medicine

## 2024-08-05 ENCOUNTER — Ambulatory Visit: Admitting: Family Medicine

## 2024-08-05 VITALS — BP 110/62 | HR 94 | Ht 64.5 in | Wt 143.4 lb

## 2024-08-05 DIAGNOSIS — Z7985 Long-term (current) use of injectable non-insulin antidiabetic drugs: Secondary | ICD-10-CM

## 2024-08-05 DIAGNOSIS — Z0000 Encounter for general adult medical examination without abnormal findings: Secondary | ICD-10-CM

## 2024-08-05 DIAGNOSIS — E119 Type 2 diabetes mellitus without complications: Secondary | ICD-10-CM | POA: Diagnosis not present

## 2024-08-05 DIAGNOSIS — E7800 Pure hypercholesterolemia, unspecified: Secondary | ICD-10-CM | POA: Diagnosis not present

## 2024-08-05 DIAGNOSIS — Z0001 Encounter for general adult medical examination with abnormal findings: Secondary | ICD-10-CM | POA: Diagnosis not present

## 2024-08-05 NOTE — Progress Notes (Signed)
 Wt Readings from Last 3 Encounters:  08/05/24 143 lb 6.4 oz (65 kg)  06/14/24 142 lb 8 oz (64.6 kg)  04/15/24 143 lb (64.9 kg)     Subjective:    Patient ID: Candice Jones, female    DOB: 1976-01-03, 49 y.o.   MRN: [id number]  HPI  Patient is a very pleasant 49 year old Caucasian female here today for complete physical exam.  She had a colonoscopy in September.  This was clear.  Due to a family history of colon cancer in her sister they recommended a repeat colonoscopy in 5 years.  The patient is due for a shingles vaccine and had a pneumonia vaccine after age 40.  Her flu shot is up-to-date.  She declines HIV screening.  She declines hepatitis B vaccination.  Her mammogram and her Pap smear are performed at her gynecologist.  Her most recent lab work as shown below Lab on 08/02/2024  Component Date Value Ref Range Status   WBC 08/02/2024 8.3  3.8 - 10.8 Thousand/uL Final   RBC 08/02/2024 5.08  3.80 - 5.10 Million/uL Final   Hemoglobin 08/02/2024 15.2  11.7 - 15.5 g/dL Final   HCT 98/97/7973 45.9  35.9 - 46.0 % Final   MCV 08/02/2024 90.4  81.4 - 101.7 fL Final   MCH 08/02/2024 29.9  27.0 - 33.0 pg Final   MCHC 08/02/2024 33.1  31.6 - 35.4 g/dL Final   RDW 98/97/7973 12.3  11.0 - 15.0 % Final   Platelets 08/02/2024 439 (H)  140 - 400 Thousand/uL Final   MPV 08/02/2024 8.9  7.5 - 12.5 fL Final   Neutro Abs 08/02/2024 4,623  1,500 - 7,800 cells/uL Final   Absolute Lymphocytes 08/02/2024 2,980  850 - 3,900 cells/uL Final   Absolute Monocytes 08/02/2024 556  200 - 950 cells/uL Final   Eosinophils Absolute 08/02/2024 83  15 - 500 cells/uL Final   Basophils Absolute 08/02/2024 58  0 - 200 cells/uL Final   Neutrophils Relative % 08/02/2024 55.7  % Final   Total Lymphocyte 08/02/2024 35.9  % Final   Monocytes Relative 08/02/2024 6.7  % Final   Eosinophils Relative 08/02/2024 1.0  % Final   Basophils Relative 08/02/2024 0.7  % Final   Glucose, Bld 08/02/2024 103 (H)  65 - 99 mg/dL Final    Comment: .            Fasting reference interval . For someone without known diabetes, a glucose value between 100 and 125 mg/dL is consistent with prediabetes and should be confirmed with a follow-up test. .    BUN 08/02/2024 13  7 - 25 mg/dL Final   Creat 98/97/7973 0.63  0.50 - 0.99 mg/dL Final   BUN/Creatinine Ratio 08/02/2024 SEE NOTE:  6 - 22 (calc) Final   Comment:    Not Reported: BUN and Creatinine are within    reference range. .    Sodium 08/02/2024 141  135 - 146 mmol/L Final   Potassium 08/02/2024 3.7  3.5 - 5.3 mmol/L Final   Chloride 08/02/2024 103  98 - 110 mmol/L Final   CO2 08/02/2024 27  20 - 32 mmol/L Final   Calcium  08/02/2024 8.8  8.6 - 10.2 mg/dL Final   Total Protein 98/97/7973 6.5  6.1 - 8.1 g/dL Final   Albumin 98/97/7973 4.0  3.6 - 5.1 g/dL Final   Globulin 98/97/7973 2.5  1.9 - 3.7 g/dL (calc) Final   AG Ratio 08/02/2024 1.6  1.0 - 2.5 (calc) Final  Total Bilirubin 08/02/2024 0.5  0.2 - 1.2 mg/dL Final   Alkaline phosphatase (APISO) 08/02/2024 73  31 - 125 U/L Final   AST 08/02/2024 15  10 - 35 U/L Final   ALT 08/02/2024 14  6 - 29 U/L Final   Hgb A1c MFr Bld 08/02/2024 6.0 (H)  <5.7 % Final   Comment: For someone without known diabetes, a hemoglobin  A1c value between 5.7% and 6.4% is consistent with prediabetes and should be confirmed with a  follow-up test. . For someone with known diabetes, a value <7% indicates that their diabetes is well controlled. A1c targets should be individualized based on duration of diabetes, age, comorbid conditions, and other considerations. . This assay result is consistent with an increased risk of diabetes. . Currently, no consensus exists regarding use of hemoglobin A1c for diagnosis of diabetes for children. .    Mean Plasma Glucose 08/02/2024 126  mg/dL Final   eAG (mmol/L) 98/97/7973 7.0  mmol/L Final   Cholesterol 08/02/2024 126  <200 mg/dL Final   HDL 98/97/7973 46 (L)  > OR = 50 mg/dL Final    Triglycerides 08/02/2024 74  <150 mg/dL Final   LDL Cholesterol (Calc) 08/02/2024 65  mg/dL (calc) Final   Comment: Reference range: <100 . Desirable range <100 mg/dL for primary prevention;   <70 mg/dL for patients with CHD or diabetic patients  with > or = 2 CHD risk factors. SABRA LDL-C is now calculated using the Martin-Hopkins  calculation, which is a validated novel method providing  better accuracy than the Friedewald equation in the  estimation of LDL-C.  Gladis APPLETHWAITE et al. SANDREA. 7986;689(80): 2061-2068  (http://education.[facility].com/faq/FAQ164)    Total CHOL/HDL Ratio 08/02/2024 2.7  <4.9 (calc) Final   Non-HDL Cholesterol (Calc) 08/02/2024 80  <130 mg/dL (calc) Final   Comment: For patients with diabetes plus 1 major ASCVD risk  factor, treating to a non-HDL-C goal of <100 mg/dL  (LDL-C of <29 mg/dL) is considered a therapeutic  option.    Creatinine, Urine 08/02/2024 394 (H)  20 - 275 mg/dL Final   Comment: Verified by repeat analysis. SABRA Lowe, Ur 08/02/2024 2.6  mg/dL Final   Comment: Reference Range Not established    Microalb Creat Ratio 08/02/2024 7  <30 mg/g creat Final   Comment: . The ADA defines abnormalities in albumin excretion as follows: SABRA Albuminuria Category        Result (mg/g creatinine) . Normal to Mildly increased   <30 Moderately increased         30-299  Severely increased           > OR = 300 . The ADA recommends that at least two of three specimens collected within a 3-6 month period be abnormal before considering a patient to be within a diagnostic category.    Vitamin B-12 08/02/2024 400  200 - 1,100 pg/mL Final   Rubeola IgG 08/02/2024 63.90  AU/mL Final   Comment: AU/mL            Interpretation -----            -------------- <13.50           Not consistent with immunity 13.50-16.49      Equivocal >16.49           Consistent with immunity . The presence of measles IgG suggests immunization or past or current  infection with measles virus. . For additional information, please refer to http://education.[facility].com/faq/FAQ162 (This link  is being provided for informational/ educational purposes only.) .    Mumps IgG 08/02/2024 117.00  AU/mL Final   Comment:  AU/mL           Interpretation -------         ---------------- <9.00             Not consistent with immunity 9.00-10.99        Equivocal >10.99            Consistent with immunity . The presence of mumps IgG antibody suggests immunization or past or current infection with mumps virus. .    Rubella 08/02/2024 2.25  Index Final   Comment:     Index            Interpretation     -----            --------------       <0.90            Not consistent with immunity     0.90-0.99        Equivocal     > or = 1.00      Consistent with immunity  . The presence of rubella IgG antibody suggests  immunization or past or current infection with rubella virus.     Past Medical History:  Diagnosis Date   Adenomyosis    Asthma    Coronary artery calcification seen on CT scan    a. 03/2023 Cardiac CT: Ca2+ =5.95 in LAD (91st%'ile).   COVID-19    Diabetes (HCC)    Diastolic dysfunction    a. 06/2023 Echo: EF 60-65%, no rwma, GrI DD, nl RV size/fxn, mild MR.   Gall stones    Gestational diabetes mellitus    Hiatal hernia    a. 03/2023 CT Chest: tiny HH.   Hyperlipidemia    IBS (irritable bowel syndrome)    Internal hemorrhoids    Kidney stones    Meniere's syndrome    Vertigo    Past Surgical History:  Procedure Laterality Date   CESAREAN SECTION  08/1999, 05/2003   CHOLECYSTECTOMY  2011   CYSTECTOMY  1997   left wrist    ENDOMETRIAL ABLATION  01/2010   HEMORRHOID BANDING  2015   MINOR HEMORRHOIDECTOMY  04/2014   thrombosed   WISDOM TOOTH EXTRACTION  1996/1997   Current Outpatient Medications on File Prior to Visit  Medication Sig Dispense Refill   aspirin EC 81 MG tablet Take 81 mg by mouth daily. Swallow whole.      albuterol  (VENTOLIN  HFA) 108 (90 Base) MCG/ACT inhaler Inhale 2 puffs into the lungs every 4 (four) hours as needed for wheezing or shortness of breath. 18 g 0   albuterol  (VENTOLIN  HFA) 108 (90 Base) MCG/ACT inhaler INHALE 1-2 PUFFS BY MOUTH EVERY 6 HOURS AS NEEDED FOR WHEEZE OR SHORTNESS OF BREATH 8.5 each 1   ALPRAZolam  (XANAX ) 0.5 MG tablet Take 1 tablet (0.5 mg total) by mouth 3 (three) times daily as needed for anxiety. 30 tablet 0   Ascorbic Acid (VITAMIN C PO) Take 1 tablet by mouth every morning. (Patient not taking: Reported on 06/28/2024)     azelastine  (ASTELIN ) 0.1 % nasal spray Place 2 sprays into both nostrils 2 (two) times daily. Use in each nostril as directed 30 mL 12   Azelastine  HCl 137 MCG/SPRAY SOLN Place 2 sprays into both nostrils every morning.     Blood Glucose Monitoring Suppl KIT Use to check fasting glucose daily  ICD10: R73.01.Dispense based on insurance preference 1 kit 0   Cholecalciferol (VITAMIN D3 PO) Take 1 tablet by mouth every morning.     Cranberry-Vitamin C-Vitamin E (CRANBERRY PLUS VITAMIN C) 4200-20-3 MG-MG-UNIT CAPS Take 2 capsules by mouth every morning.     docusate sodium (COLACE) 100 MG capsule Take 100 mg by mouth every morning.     fluticasone  (FLONASE ) 50 MCG/ACT nasal spray SPRAY 2 SPRAYS INTO EACH NOSTRIL EVERY DAY (Patient taking differently: Place 2 sprays into both nostrils daily as needed for allergies or rhinitis.) 48 mL 1   fluticasone  (FLONASE ) 50 MCG/ACT nasal spray Place 2 sprays into both nostrils daily. 16 g 12   glucose blood test strip Use with glucose meter to check blood sugar.ICD10: R73.01.Dispense based on insurance preference 100 each 12   Lancets (FREESTYLE) lancets Use to check blood sugar ICD10: R73.01.Dispense based on insurance preference 100 each 12   levocetirizine (XYZAL) 5 MG tablet Take 5 mg by mouth every morning.     losartan  (COZAAR ) 25 MG tablet Take 1 tablet (25 mg total) by mouth daily. 90 tablet 3   Misc Natural  Products (ELDERBERRY ZINC/VIT C/IMMUNE MT) Use as directed in the mouth or throat.     Multiple Vitamin (MULTIVITAMIN WITH MINERALS) TABS tablet Take 1 tablet by mouth every morning.     Multiple Vitamins-Minerals (IMMUNE SUPPORT) CHEW Chew 2 tablets by mouth every morning.     norethindrone-ethinyl estradiol (LOESTRIN) 1-20 MG-MCG tablet Take 1 tablet by mouth every morning.     Olopatadine  HCl (PATADAY ) 0.7 % SOLN Apply 1 drop twice daily to the left eye as needed for redness and itching. 5 mL 0   pantoprazole  (PROTONIX ) 40 MG tablet Take 1 tablet (40 mg total) by mouth 2 (two) times daily. 180 tablet 2   predniSONE  (STERAPRED UNI-PAK 21 TAB) 10 MG (21) TBPK tablet Per instructions 21 tablet 0   predniSONE  (STERAPRED UNI-PAK 21 TAB) 10 MG (21) TBPK tablet Per instructions (6, 5, 4, 3, 2, 1) 21 tablet 0   rosuvastatin  (CRESTOR ) 10 MG tablet TAKE 1 TABLET BY MOUTH EVERY DAY 90 tablet 1   Semaglutide , 2 MG/DOSE, (OZEMPIC , 2 MG/DOSE,) 8 MG/3ML SOPN INJECT 2 MG AS DIRECTED ONCE A WEEK. 2 mL 2   triamterene -hydrochlorothiazide (DYAZIDE) 37.5-25 MG capsule Take 1 each (1 capsule total) by mouth daily. 90 capsule 3   VITAMIN A PO Take 1 capsule by mouth every morning.     No current facility-administered medications on file prior to visit.   Allergies  Allergen Reactions   Augmentin [Amoxicillin -Pot Clavulanate] Nausea And Vomiting    Severe nausea/vomiting    Ciprofloxacin Hcl Rash   Codeine Other (See Comments)    Makes pain worse   Erythromycin Rash   Sulfa Antibiotics Rash   Social History   Socioeconomic History   Marital status: Married    Spouse name: Not on file   Number of children: 3   Years of education: Not on file   Highest education level: Bachelor's degree (e.g., BA, AB, BS)  Occupational History   Occupation: Energy Manager: [employer]  Tobacco Use   Smoking status: Never   Smokeless tobacco: Never  Vaping Use   Vaping status: Never Used   Substance and Sexual Activity   Alcohol use: Not Currently    Comment: rarely   Drug use: No   Sexual activity: Yes  Other Topics Concern   Not on file  Social  History Narrative   Married, 2 sons and 1 daughter   Fifth grade teacher [employer]   No caffeine alcohol tobacco or drugs   Social Drivers of Health   Tobacco Use: Low Risk (08/05/2024)   Patient History    Smoking Tobacco Use: Never    Smokeless Tobacco Use: Never    Passive Exposure: Not on file  Financial Resource Strain: Low Risk (08/02/2024)   Overall Financial Resource Strain (CARDIA)    Difficulty of Paying Living Expenses: Not very hard  Food Insecurity: No Food Insecurity (08/02/2024)   Epic    Worried About Radiation Protection Practitioner of Food in the Last Year: Never true    Ran Out of Food in the Last Year: Never true  Transportation Needs: No Transportation Needs (08/02/2024)   Epic    Lack of Transportation (Medical): No    Lack of Transportation (Non-Medical): No  Physical Activity: Inactive (08/02/2024)   Exercise Vital Sign    Days of Exercise per Week: 0 days    Minutes of Exercise per Session: Not on file  Stress: No Stress Concern Present (08/02/2024)   [employer] of Occupational Health - Occupational Stress Questionnaire    Feeling of Stress: Only a little  Social Connections: Socially Integrated (08/02/2024)   Social Connection and Isolation Panel    Frequency of Communication with Friends and Family: More than three times a week    Frequency of Social Gatherings with Friends and Family: Once a week    Attends Religious Services: More than 4 times per year    Active Member of [employer] or Organizations: Yes    Attends Banker Meetings: More than 4 times per year    Marital Status: Married  Catering Manager Violence: Not on file  Depression (PHQ2-9): Low Risk (08/05/2024)   Depression (PHQ2-9)    PHQ-2 Score: 0  Alcohol Screen: Not on file  Housing: Low Risk (08/02/2024)   Epic     Unable to Pay for Housing in the Last Year: No    Number of Times Moved in the Last Year: 0    Homeless in the Last Year: No  Utilities: Not on file  Health Literacy: Not on file      Review of Systems  Constitutional: Negative.   All other systems reviewed and are negative.      Objective:   Physical Exam Vitals reviewed.  Constitutional:      General: She is not in acute distress.    Appearance: Normal appearance. She is well-developed. She is obese. She is not diaphoretic.  HENT:     Head: Normocephalic and atraumatic.     Right Ear: Tympanic membrane and ear canal normal.     Left Ear: Tympanic membrane and ear canal normal.     Nose: Nose normal. No congestion or rhinorrhea.     Mouth/Throat:     Mouth: Mucous membranes are moist.     Pharynx: No oropharyngeal exudate or posterior oropharyngeal erythema.  Eyes:     Extraocular Movements: Extraocular movements intact.     Pupils: Pupils are equal, round, and reactive to light.  Neck:     Vascular: No carotid bruit.  Cardiovascular:     Rate and Rhythm: Normal rate and regular rhythm.     Pulses: Normal pulses.     Heart sounds: Normal heart sounds. No murmur heard.    No friction rub.  Pulmonary:     Effort: Pulmonary effort is normal.  No respiratory distress.     Breath sounds: Normal breath sounds. No stridor. No wheezing.  Abdominal:     General: Abdomen is flat. Bowel sounds are normal. There is no distension.     Palpations: Abdomen is soft.     Tenderness: There is no abdominal tenderness. There is no guarding or rebound.  Musculoskeletal:     Cervical back: Normal range of motion and neck supple. No rigidity or tenderness.     Right lower leg: No edema.     Left lower leg: No edema.  Lymphadenopathy:     Cervical: No cervical adenopathy.  Skin:    Findings: No erythema or rash.  Neurological:     General: No focal deficit present.     Mental Status: She is alert and oriented to person, place, and  time.  Psychiatric:        Mood and Affect: Mood normal.        Behavior: Behavior normal.        Thought Content: Thought content normal.        Judgment: Judgment normal.           Assessment & Plan:  General medical exam  Controlled type 2 diabetes mellitus without complication, without long-term current use of insulin (HCC)  Pure hypercholesterolemia Blood pressure today is very low.  I recommended the patient discontinue metoprolol .  She we will continue Maxide due to Mnire's disease and she wants to continue losartan  due to renal protection given her history of diabetes.  Immunizations are up-to-date.  Colonoscopy, mammogram, Pap smear are up-to-date.  Patient is due for a pneumonia vaccine and shingles vaccine after age 49.  Diabetes is well-controlled.  I am very proud of the patient for maintaining her weight loss and her excellent glycemic control.  Cholesterol is acceptable.  Regular anticipatory guidance is provided

## 2024-08-05 NOTE — Progress Notes (Signed)
 Wt Readings from Last 3 Encounters:  08/05/24 143 lb 6.4 oz (65 kg)  06/14/24 142 lb 8 oz (64.6 kg)  04/15/24 143 lb (64.9 kg)

## 2024-08-27 ENCOUNTER — Ambulatory Visit (INDEPENDENT_AMBULATORY_CARE_PROVIDER_SITE_OTHER): Admitting: Otolaryngology

## 2024-09-18 ENCOUNTER — Ambulatory Visit (INDEPENDENT_AMBULATORY_CARE_PROVIDER_SITE_OTHER): Admitting: Otolaryngology

## 2025-08-05 ENCOUNTER — Other Ambulatory Visit

## 2025-08-08 ENCOUNTER — Encounter: Admitting: Family Medicine
